# Patient Record
Sex: Male | Born: 1955 | Race: White | Hispanic: No | Marital: Married | Smoking: Current every day smoker
Health system: Southern US, Community
[De-identification: ages and names within clinical notes are randomized; demographics above are authoritative.]

## PROBLEM LIST (undated history)

## (undated) ENCOUNTER — Telehealth

## (undated) ENCOUNTER — Ambulatory Visit: Payer: PRIVATE HEALTH INSURANCE

## (undated) ENCOUNTER — Encounter

## (undated) ENCOUNTER — Ambulatory Visit

## (undated) ENCOUNTER — Ambulatory Visit
Attending: Thoracic Surgery (Cardiothoracic Vascular Surgery) | Primary: Thoracic Surgery (Cardiothoracic Vascular Surgery)

## (undated) ENCOUNTER — Ambulatory Visit: Payer: Medicare (Managed Care)

## (undated) ENCOUNTER — Institutional Professional Consult (permissible substitution): Payer: PRIVATE HEALTH INSURANCE

## (undated) ENCOUNTER — Telehealth: Attending: Family Medicine | Primary: Family Medicine

## (undated) DIAGNOSIS — G4700 Insomnia, unspecified: Secondary | ICD-10-CM

## (undated) DIAGNOSIS — Z87442 Personal history of urinary calculi: Secondary | ICD-10-CM

## (undated) DIAGNOSIS — I2510 Atherosclerotic heart disease of native coronary artery without angina pectoris: Secondary | ICD-10-CM

## (undated) DIAGNOSIS — I4510 Unspecified right bundle-branch block: Secondary | ICD-10-CM

## (undated) DIAGNOSIS — F1291 Cannabis use, unspecified, in remission: Secondary | ICD-10-CM

## (undated) DIAGNOSIS — F419 Anxiety disorder, unspecified: Secondary | ICD-10-CM

## (undated) DIAGNOSIS — N529 Male erectile dysfunction, unspecified: Secondary | ICD-10-CM

## (undated) DIAGNOSIS — G5600 Carpal tunnel syndrome, unspecified upper limb: Secondary | ICD-10-CM

## (undated) DIAGNOSIS — I739 Peripheral vascular disease, unspecified: Secondary | ICD-10-CM

## (undated) DIAGNOSIS — E221 Hyperprolactinemia: Secondary | ICD-10-CM

## (undated) DIAGNOSIS — M1990 Unspecified osteoarthritis, unspecified site: Secondary | ICD-10-CM

## (undated) DIAGNOSIS — E119 Type 2 diabetes mellitus without complications: Secondary | ICD-10-CM

## (undated) DIAGNOSIS — I10 Essential (primary) hypertension: Secondary | ICD-10-CM

## (undated) DIAGNOSIS — E785 Hyperlipidemia, unspecified: Secondary | ICD-10-CM

## (undated) DIAGNOSIS — E113593 Type 2 diabetes mellitus with proliferative diabetic retinopathy without macular edema, bilateral: Secondary | ICD-10-CM

## (undated) DIAGNOSIS — R002 Palpitations: Secondary | ICD-10-CM

## (undated) DIAGNOSIS — D369 Benign neoplasm, unspecified site: Secondary | ICD-10-CM

## (undated) DIAGNOSIS — F1190 Opioid use, unspecified, uncomplicated: Secondary | ICD-10-CM

## (undated) DIAGNOSIS — E291 Testicular hypofunction: Secondary | ICD-10-CM

## (undated) DIAGNOSIS — R0609 Other forms of dyspnea: Secondary | ICD-10-CM

## (undated) DIAGNOSIS — G4733 Obstructive sleep apnea (adult) (pediatric): Secondary | ICD-10-CM

## (undated) DIAGNOSIS — G4730 Sleep apnea, unspecified: Secondary | ICD-10-CM

## (undated) DIAGNOSIS — K219 Gastro-esophageal reflux disease without esophagitis: Secondary | ICD-10-CM

## (undated) DIAGNOSIS — G8929 Other chronic pain: Secondary | ICD-10-CM

## (undated) DIAGNOSIS — I4891 Unspecified atrial fibrillation: Secondary | ICD-10-CM

## (undated) DIAGNOSIS — K5790 Diverticulosis of intestine, part unspecified, without perforation or abscess without bleeding: Secondary | ICD-10-CM

## (undated) HISTORY — PX: EYE SURGERY: SHX253

## (undated) HISTORY — PX: KNEE ARTHROSCOPY: SUR90

## (undated) HISTORY — DX: Unspecified atrial fibrillation: I48.91

## (undated) HISTORY — DX: Type 2 diabetes mellitus without complications: E11.9

## (undated) HISTORY — DX: Sleep apnea, unspecified: G47.30

## (undated) HISTORY — DX: Atherosclerotic heart disease of native coronary artery without angina pectoris: I25.10

## (undated) HISTORY — PX: OTHER SURGICAL HISTORY: SHX169

## (undated) HISTORY — DX: Hyperlipidemia, unspecified: E78.5

## (undated) HISTORY — PX: TONSILLECTOMY: SUR1361

## (undated) HISTORY — PX: APPENDECTOMY: SHX54

## (undated) HISTORY — DX: Essential (primary) hypertension: I10

---

## 1993-05-07 DIAGNOSIS — I4891 Unspecified atrial fibrillation: Secondary | ICD-10-CM

## 1993-05-07 HISTORY — DX: Unspecified atrial fibrillation: I48.91

## 1997-11-10 ENCOUNTER — Ambulatory Visit (HOSPITAL_COMMUNITY): Admission: RE | Admit: 1997-11-10 | Discharge: 1997-11-10 | Payer: Self-pay | Admitting: Cardiovascular Disease

## 2006-02-07 ENCOUNTER — Ambulatory Visit: Payer: Self-pay | Admitting: Unknown Physician Specialty

## 2007-10-28 ENCOUNTER — Ambulatory Visit: Payer: Self-pay | Admitting: Medical

## 2009-11-19 ENCOUNTER — Inpatient Hospital Stay: Payer: Self-pay | Admitting: Surgery

## 2010-05-07 HISTORY — PX: CARDIAC CATHETERIZATION: SHX172

## 2011-01-15 ENCOUNTER — Ambulatory Visit: Payer: Self-pay | Admitting: Cardiovascular Disease

## 2011-01-15 DIAGNOSIS — I2510 Atherosclerotic heart disease of native coronary artery without angina pectoris: Secondary | ICD-10-CM

## 2011-01-15 HISTORY — DX: Atherosclerotic heart disease of native coronary artery without angina pectoris: I25.10

## 2011-01-15 HISTORY — PX: LEFT HEART CATH AND CORONARY ANGIOGRAPHY: CATH118249

## 2011-01-24 ENCOUNTER — Emergency Department: Payer: Self-pay | Admitting: Emergency Medicine

## 2011-03-15 ENCOUNTER — Ambulatory Visit: Payer: Self-pay | Admitting: Emergency Medicine

## 2011-03-16 LAB — PATHOLOGY REPORT

## 2012-08-18 ENCOUNTER — Emergency Department: Payer: Self-pay | Admitting: Emergency Medicine

## 2012-08-18 LAB — COMPREHENSIVE METABOLIC PANEL
Albumin: 3.9 g/dL (ref 3.4–5.0)
Alkaline Phosphatase: 115 U/L (ref 50–136)
Anion Gap: 8 (ref 7–16)
BUN: 16 mg/dL (ref 7–18)
Bilirubin,Total: 0.4 mg/dL (ref 0.2–1.0)
Calcium, Total: 9.2 mg/dL (ref 8.5–10.1)
Chloride: 105 mmol/L (ref 98–107)
Co2: 26 mmol/L (ref 21–32)
Creatinine: 0.93 mg/dL (ref 0.60–1.30)
EGFR (African American): >60
EGFR (Non-African Amer.): >60
Glucose: 159 mg/dL — ABNORMAL HIGH (ref 65–99)
Osmolality: 282 (ref 275–301)
Potassium: 3.7 mmol/L (ref 3.5–5.1)
SGOT(AST): 111 U/L — ABNORMAL HIGH (ref 15–37)
SGPT (ALT): 185 U/L — ABNORMAL HIGH (ref 12–78)
Sodium: 139 mmol/L (ref 136–145)
Total Protein: 7.9 g/dL (ref 6.4–8.2)

## 2012-08-18 LAB — TROPONIN I: Troponin-I: <0.02 ng/mL

## 2012-08-18 LAB — URINALYSIS, COMPLETE
Bacteria: NONE SEEN
Bilirubin,UR: NEGATIVE
Blood: NEGATIVE
Glucose,UR: 50 mg/dL (ref 0–75)
Ketone: NEGATIVE
Leukocyte Esterase: NEGATIVE
Nitrite: NEGATIVE
Ph: 5 (ref 4.5–8.0)
Protein: NEGATIVE
RBC,UR: <1 /HPF (ref 0–5)
Specific Gravity: 1.017 (ref 1.003–1.030)
Squamous Epithelial: NONE SEEN
WBC UR: <1 /HPF (ref 0–5)

## 2012-08-18 LAB — CK TOTAL AND CKMB (NOT AT ARMC)
CK, Total: 105 U/L (ref 35–232)
CK-MB: 1.5 ng/mL (ref 0.5–3.6)

## 2012-08-18 LAB — CBC
HCT: 44.5 % (ref 40.0–52.0)
HGB: 15.6 g/dL (ref 13.0–18.0)
MCH: 31 pg (ref 26.0–34.0)
MCHC: 34.9 g/dL (ref 32.0–36.0)
MCV: 89 fL (ref 80–100)
Platelet: 249 10*3/uL (ref 150–440)
RBC: 5.01 10*6/uL (ref 4.40–5.90)
RDW: 12.6 % (ref 11.5–14.5)
WBC: 11.3 10*3/uL — ABNORMAL HIGH (ref 3.8–10.6)

## 2012-08-26 ENCOUNTER — Ambulatory Visit: Payer: Self-pay | Admitting: Internal Medicine

## 2013-04-27 ENCOUNTER — Emergency Department: Payer: Self-pay | Admitting: Emergency Medicine

## 2013-05-07 HISTORY — PX: ROTATOR CUFF REPAIR: SHX139

## 2013-06-11 ENCOUNTER — Ambulatory Visit: Payer: Self-pay | Admitting: Orthopedic Surgery

## 2013-06-12 DIAGNOSIS — L570 Actinic keratosis: Secondary | ICD-10-CM

## 2013-06-12 HISTORY — DX: Actinic keratosis: L57.0

## 2013-07-28 ENCOUNTER — Ambulatory Visit: Payer: Self-pay | Admitting: Orthopedic Surgery

## 2013-09-11 DIAGNOSIS — Z9889 Other specified postprocedural states: Secondary | ICD-10-CM | POA: Insufficient documentation

## 2013-10-05 ENCOUNTER — Ambulatory Visit: Payer: Self-pay | Admitting: Unknown Physician Specialty

## 2013-10-28 DIAGNOSIS — M25569 Pain in unspecified knee: Secondary | ICD-10-CM

## 2013-10-28 DIAGNOSIS — G8929 Other chronic pain: Secondary | ICD-10-CM | POA: Insufficient documentation

## 2014-02-14 DIAGNOSIS — Z794 Long term (current) use of insulin: Secondary | ICD-10-CM | POA: Insufficient documentation

## 2014-02-16 DIAGNOSIS — N529 Male erectile dysfunction, unspecified: Secondary | ICD-10-CM | POA: Insufficient documentation

## 2014-02-16 DIAGNOSIS — E662 Morbid (severe) obesity with alveolar hypoventilation: Secondary | ICD-10-CM | POA: Insufficient documentation

## 2014-08-28 NOTE — Op Note (Signed)
PATIENT NAME:  Joshua Fowler, Joshua Fowler MR#:  [id number] DATE OF BIRTH:  21-May-1955  DATE OF PROCEDURE:  07/28/2013  PREOPERATIVE DIAGNOSIS:  Right shoulder full-thickness rotator cuff tear, supraspinatus and anterior aspect of infraspinatus.    POSTOPERATIVE DIAGNOSIS: Right shoulder full-thickness rotator cuff tear, supraspinatus and anterior aspect of infraspinatus.  PROCEDURE PERFORMED:  1.  Arthroscopic repair of rotator cuff.  2.  Subacromial decompression.  3.  Biceps tenotomy.  4.  Extensive bursectomy.   SURGEON:  Dawayne Patricia, MD   ASSISTANT:April Tretha Sciara, NP ANESTHESIA:  General anesthesia with interscalene block.   SURGICAL FINDINGS:   1. Full-thickness tear of supraspinatus with anterior portion of infraspinatus.  2. Significant bursitis with downward sloping acromial spur.  3. Partial tear subscapularis. 4. Ruptured biceps tendon.    COMPLICATIONS:  None.   DISPOSITION:  The patient will be discharged home same day surgery and will follow up in the office on Friday.   INDICATIONS FOR PROCEDURE:  Joshua Fowler is a 59 year old gentleman who sustained a tear, rotator cuff, right shoulder. After confirmation with MRI, extensive discussion is had with patient regarding surgical intervention. All the risks and benefits were explained. The patient decided to proceed with surgical intervention.   DESCRIPTION OF PROCEDURE:  The patient was identified in the preoperative holding area. The right shoulder was marked as operative site.   The patient was brought into the operating room. Interscalene block was administered. General anesthesia was administered. The patient was placed on the operating room table and secured in a beach chair position. Adequate padding was placed at bony prominences. The patient was secured to the table.   Timeout was performed. The patient was identified, laterality, procedure, confirmation of consent form, confirmation of preoperative antibiotics, skin  preparation, and site marking.   A standard posterior portal was made. Arthroscope was inserted into the glenohumeral joint. The patient was found to have significant fraying of the proximal aspect of the splenoid labrum. Biceps tendon was completely ruptured and no longer visible. On inspection of the bicipital groove, biceps tendon appeared to be tenodesed by soft tissue scar. West Carbo was used to remove excessive synovitis and debride a significant scar along the anterior capsule. Glenoid demonstrated some grade 1-2 degenerative changes. Humeral head demonstrated some grade 1 degenerative changes.   Attention was now turned to the rotator cuff. The patient had a very large rotator cuff tear that was full thickness with retraction to the glenohumeral articulation. Subscapularis demonstrated a small partial thickness tear with the remainder of the tendon intact. A lateral portal was made. The camera was now moved into the subacromial space. West Carbo was inserted through the lateral portal into the subacromial space. A very extensive bursectomy was carried out. Using a shaver and burr, a subacromial decompression was carried out leaving a level surface and clearing sufficient space for rotator cuff repair. At this time, the torn edge of the rotator cuff was identified and cleaned. The humeral footprint was cleaned of soft tissue, and gently burred to achieve a nicely bleeding surface. In the medial area of the footprint, an anterior SwiveLock anchor was placed. A looped FiberTape was passed through the anterior portion of the rotator cuff. In a similar fashion, a posterior anchor was placed in the posterior aspect of the medial footprint. Linked FiberTape suture was passed in the posterior one-half. Both sutures were retrieved at the anterior portal. A stay stitch in both the anterior and posterior anchors were passed through the cuff in between the fiber tapes.  These were passed in a horizontal fashion. Both of  these sutures were tied using sliding knots. Rotator cuff nicely reapproximated to footprint. At this time, both suture splices were cut, and 1 limb of each suture was taken back through the lateral portal. The 2 suture limbs were passed through a SwiveLock anchor and loaded. An awl was used to make a hole in the posterior aspect of the lateral footprint. The SwiveLock anchor with 1 suture from each limb of the medial row was deployed. Each suture limb was tightened, nicely reducing the rotator cuff back to the humeral footprint. Anchor was fully secured. Suture limbs were cut. In a similar fashion, the remaining 2 suture limbs from the medial row were passed through a second SwiveLock anchor. A second hole was made in the anterior aspect of the lateral row, and SwiveLock anchor was deployed. Sutures were tightened, and cuff was nicely reduced. Suture limbs were cut.   The shoulder was copiously irrigated. At this time, the shoulder was taken through a full range of motion, and rotator cuff was found to be nicely secured throughout the pass of repair. Arthroscope was now reinserted into the glenohumeral joint, and the rotator cuff was very nicely laid down on the humeral footprint.   At this time, the shoulder was drained of excess fluid. All instrumentation was removed. Portals were closed using 2-0 Vicryl suture for subcutaneous tissue and 3-0 nylon suture for skin. Sterile dressings were applied. The patient was placed in a sling with an abduction pillow. TENS leads were applied. A Polar Care unit was applied. Care was taken to ensure that the Polar Care did not touch exposed skin at any portion. The patient was awoken and taken to the postoperative care unit in stable condition.     ____________________________ Dawayne Patricia, MD sr:lt D: 08/03/2013 16:43:09 ET T: 08/04/2013 00:21:41 ET JOB#: [id number]  cc: Dawayne Patricia, MD, <Dictator> Dawayne Patricia MD ELECTRONICALLY SIGNED 08/25/2013  11:52

## 2014-11-24 ENCOUNTER — Other Ambulatory Visit: Payer: Self-pay | Admitting: Family Medicine

## 2014-11-24 MED ORDER — INSULIN LISPRO 100 UNIT/ML (KWIKPEN): 40.0000 [IU] | PEN_INJECTOR | Freq: Three times a day (TID) | SUBCUTANEOUS | Status: DC

## 2014-11-24 MED ORDER — INSULIN GLARGINE 100 UNIT/ML SOLOSTAR PEN
280.0000 [IU] | PEN_INJECTOR | Freq: Every day | SUBCUTANEOUS | Status: DC
Start: 2014-11-24 — End: 2015-10-06

## 2015-01-26 ENCOUNTER — Other Ambulatory Visit: Payer: Self-pay | Admitting: Family Medicine

## 2015-02-15 ENCOUNTER — Other Ambulatory Visit: Payer: Self-pay | Admitting: Family Medicine

## 2015-04-05 ENCOUNTER — Other Ambulatory Visit: Payer: Self-pay | Admitting: Family Medicine

## 2015-04-05 DIAGNOSIS — I10 Essential (primary) hypertension: Secondary | ICD-10-CM | POA: Insufficient documentation

## 2015-04-05 DIAGNOSIS — E119 Type 2 diabetes mellitus without complications: Secondary | ICD-10-CM | POA: Insufficient documentation

## 2015-04-05 DIAGNOSIS — E785 Hyperlipidemia, unspecified: Secondary | ICD-10-CM | POA: Insufficient documentation

## 2015-04-06 ENCOUNTER — Ambulatory Visit (INDEPENDENT_AMBULATORY_CARE_PROVIDER_SITE_OTHER): Payer: 59 | Admitting: Family Medicine

## 2015-04-06 ENCOUNTER — Encounter: Payer: Self-pay | Admitting: Family Medicine

## 2015-04-06 VITALS — BP 115/71 | HR 75 | Temp 99.3°F | Ht 67.5 in | Wt 254.0 lb

## 2015-04-06 DIAGNOSIS — G4730 Sleep apnea, unspecified: Secondary | ICD-10-CM | POA: Diagnosis not present

## 2015-04-06 DIAGNOSIS — I2510 Atherosclerotic heart disease of native coronary artery without angina pectoris: Secondary | ICD-10-CM | POA: Insufficient documentation

## 2015-04-06 DIAGNOSIS — E785 Hyperlipidemia, unspecified: Secondary | ICD-10-CM

## 2015-04-06 DIAGNOSIS — Z23 Encounter for immunization: Secondary | ICD-10-CM

## 2015-04-06 DIAGNOSIS — I10 Essential (primary) hypertension: Secondary | ICD-10-CM

## 2015-04-06 DIAGNOSIS — Z113 Encounter for screening for infections with a predominantly sexual mode of transmission: Secondary | ICD-10-CM | POA: Diagnosis not present

## 2015-04-06 DIAGNOSIS — E119 Type 2 diabetes mellitus without complications: Secondary | ICD-10-CM | POA: Diagnosis not present

## 2015-04-06 DIAGNOSIS — Z0000 Encounter for general adult medical examination without abnormal findings: Secondary | ICD-10-CM

## 2015-04-06 LAB — URINALYSIS, ROUTINE W REFLEX MICROSCOPIC
Bilirubin, UA: NEGATIVE
Leukocytes, UA: NEGATIVE
Nitrite, UA: NEGATIVE
Protein, UA: NEGATIVE
RBC, UA: NEGATIVE
Specific Gravity, UA: 1.02 (ref 1.005–1.030)
Urobilinogen, Ur: 0.2 mg/dL (ref 0.2–1.0)
pH, UA: 5 (ref 5.0–7.5)

## 2015-04-06 MED ORDER — HYDRALAZINE HCL 25 MG PO TABS: 25.0000 mg | ORAL_TABLET | Freq: Two times a day (BID) | ORAL | Status: DC

## 2015-04-06 MED ORDER — PANTOPRAZOLE SODIUM 40 MG PO TBEC: 40.0000 mg | DELAYED_RELEASE_TABLET | Freq: Every day | ORAL | Status: DC

## 2015-04-06 MED ORDER — EZETIMIBE 10 MG PO TABS: 10.0000 mg | ORAL_TABLET | Freq: Every day | ORAL | Status: DC

## 2015-04-06 MED ORDER — COLESEVELAM HCL 625 MG PO TABS: 1875.0000 mg | ORAL_TABLET | Freq: Two times a day (BID) | ORAL | Status: DC

## 2015-04-06 MED ORDER — VENLAFAXINE HCL ER 37.5 MG PO CP24: 37.5000 mg | ORAL_CAPSULE | Freq: Every day | ORAL | Status: DC

## 2015-04-06 NOTE — Assessment & Plan Note (Signed)
The current medical regimen is effective;  continue present plan and medications.  

## 2015-04-06 NOTE — Assessment & Plan Note (Signed)
Followed by endocrine

## 2015-04-06 NOTE — Progress Notes (Signed)
BP 115/71 mmHg  Pulse 75  Temp(Src) 99.3 F (37.4 C)  Ht 5' 7.5" (1.715 m)  Wt 254 lb (115.214 kg)  BMI 39.17 kg/m2  SpO2 98%   Subjective:    Patient ID: Joshua Fowler, male    DOB: Sep 22, 1955, 59 y.o.   MRN: [id number]  HPI: Joshua Fowler is a 59 y.o. male  Chief Complaint  Patient presents with  . Annual Exam    following with endocrinology for diabetes A1c in the sevens has lost 18 pounds with phentermine prescribed by Dr. Eddie Dibbles  Patient was some congestion and drainage more allergy symptoms used Claritin for a little bit didn't do much has used some Afrin. Rarely uses Ambien Using CPAP every night Reflux stable Cholesterol doing well Depression nerves doing okay  Relevant past medical, surgical, family and social history reviewed and updated as indicated. Interim medical history since our last visit reviewed. Allergies and medications reviewed and updated.  Review of Systems  Constitutional: Negative.   HENT: Negative.   Eyes: Negative.   Respiratory: Negative.   Cardiovascular: Negative.   Gastrointestinal: Negative.   Endocrine: Negative.   Genitourinary: Negative.   Musculoskeletal: Negative.   Skin: Negative.   Allergic/Immunologic: Negative.   Neurological: Negative.   Hematological: Negative.   Psychiatric/Behavioral: Negative.     Per HPI unless specifically indicated above     Objective:    BP 115/71 mmHg  Pulse 75  Temp(Src) 99.3 F (37.4 C)  Ht 5' 7.5" (1.715 m)  Wt 254 lb (115.214 kg)  BMI 39.17 kg/m2  SpO2 98%  Wt Readings from Last 3 Encounters:  04/06/15 254 lb (115.214 kg)  08/10/14 272 lb (123.378 kg)    Physical Exam  Constitutional: He is oriented to person, place, and time. He appears well-developed and well-nourished.  HENT:  Head: Normocephalic and atraumatic.  Right Ear: External ear normal.  Left Ear: External ear normal.  Eyes: Conjunctivae and EOM are normal. Pupils are equal, round, and reactive to light.   Neck: Normal range of motion. Neck supple.  Cardiovascular: Normal rate, regular rhythm, normal heart sounds and intact distal pulses.   Pulmonary/Chest: Effort normal and breath sounds normal.  Abdominal: Soft. Bowel sounds are normal. There is no splenomegaly or hepatomegaly.  Genitourinary: Rectum normal, prostate normal and penis normal.  Musculoskeletal: Normal range of motion.  Neurological: He is alert and oriented to person, place, and time. He has normal reflexes.  Skin: No rash noted. No erythema.  Psychiatric: He has a normal mood and affect. His behavior is normal. Judgment and thought content normal.    Results for orders placed or performed in visit on 08/18/12  CK total and CKMB (cardiac)  Result Value Ref Range   CK, Total 105 35-232 Unit/L   CK-MB 1.5 0.5-3.6 ng/mL  Comprehensive metabolic panel  Result Value Ref Range   Glucose 159 (H) 65-99 mg/dL   BUN 16 7-18 mg/dL   Creatinine 0.93 0.60-1.30 mg/dL   Sodium 139 136-145 mmol/L   Potassium 3.7 3.5-5.1 mmol/L   Chloride 105 98-107 mmol/L   Co2 26 21-32 mmol/L   Calcium, Total 9.2 8.5-10.1 mg/dL   SGOT(AST) 111 (H) 15-37 Unit/L   SGPT (ALT) 185 (H) 12-78 U/L   Alkaline Phosphatase 115 50-136 Unit/L   Albumin 3.9 3.4-5.0 g/dL   Total Protein 7.9 6.4-8.2 g/dL   Bilirubin,Total 0.4 0.2-1.0 mg/dL   Osmolality 282 275-301   Anion Gap 8 7-16   EGFR (African American) >60  EGFR (Non-African Amer.) >60   CBC  Result Value Ref Range   WBC 11.3 (H) 3.8-10.6 x10 3/mm 3   RBC 5.01 4.40-5.90 x10 6/mm 3   HGB 15.6 13.0-18.0 g/dL   HCT 44.5 40.0-52.0 %   MCV 89 80-100 fL   MCH 31.0 26.0-34.0 pg   MCHC 34.9 32.0-36.0 g/dL   RDW 12.6 11.5-14.5 %   Platelet 249 150-440 x10 3/mm 3  Troponin I  Result Value Ref Range   Troponin-I < 0.02 ng/mL  Urinalysis, Complete  Result Value Ref Range   Color - urine Yellow    Clarity - urine Clear    Glucose,UR 50 mg/dL 0-75 mg/dL   Bilirubin,UR Negative NEGATIVE   Ketone  Negative NEGATIVE   Specific Gravity 1.017 1.003-1.030   Blood Negative NEGATIVE   Ph 5.0 4.5-8.0   Protein Negative NEGATIVE   Nitrite Negative NEGATIVE   Leukocyte Esterase Negative NEGATIVE   RBC,UR <1 /HPF 0-5 /HPF   WBC UR <1 /HPF 0-5 /HPF   Bacteria NONE SEEN NONE SEEN   Squamous Epithelial NONE SEEN       Assessment & Plan:   Problem List Items Addressed This Visit      Cardiovascular and Mediastinum   Hypertension    The current medical regimen is effective;  continue present plan and medications.       Relevant Medications   Tadalafil 2.5 MG TABS   colesevelam (WELCHOL) 625 MG tablet   ezetimibe (ZETIA) 10 MG tablet   hydrALAZINE (APRESOLINE) 25 MG tablet   CAD (coronary artery disease)   Relevant Medications   Tadalafil 2.5 MG TABS   colesevelam (WELCHOL) 625 MG tablet   ezetimibe (ZETIA) 10 MG tablet   hydrALAZINE (APRESOLINE) 25 MG tablet     Endocrine   Diabetes mellitus without complication (HCC)    Followed by endocrine      Relevant Medications   Insulin Glargine (TOUJEO SOLOSTAR) 300 UNIT/ML SOPN     Other   Sleep apnea   Hyperlipidemia    The current medical regimen is effective;  continue present plan and medications.       Relevant Medications   Tadalafil 2.5 MG TABS   colesevelam (WELCHOL) 625 MG tablet   ezetimibe (ZETIA) 10 MG tablet   hydrALAZINE (APRESOLINE) 25 MG tablet    Other Visit Diagnoses    Routine general medical examination at a health care facility    -  Primary    Relevant Orders    CBC with Differential/Platelet    Comprehensive metabolic panel    Lipid Panel w/o Chol/HDL Ratio    PSA    TSH    Urinalysis, Routine w reflex microscopic (not at [facility])    Immunization due        Relevant Orders    Tdap vaccine greater than or equal to 7yo IM (Completed)    Flu Vaccine QUAD 36+ mos PF IM (Fluarix & Fluzone Quad PF) (Completed)    Routine screening for STI (sexually transmitted infection)        Relevant Orders     HIV antibody    Hepatitis C Antibody        Follow up plan: Return in about 6 months (around 10/04/2015), or if symptoms worsen or fail to improve, for lipids, alt, ast, bmp med check.

## 2015-04-07 LAB — CBC WITH DIFFERENTIAL/PLATELET
Basophils Absolute: 0.1 10*3/uL (ref 0.0–0.2)
Basos: 1 %
EOS (ABSOLUTE): 1 10*3/uL — ABNORMAL HIGH (ref 0.0–0.4)
Eos: 14 %
Hematocrit: 44 % (ref 37.5–51.0)
Hemoglobin: 14.6 g/dL (ref 12.6–17.7)
Immature Grans (Abs): 0 10*3/uL (ref 0.0–0.1)
Immature Granulocytes: 0 %
Lymphocytes Absolute: 2.1 10*3/uL (ref 0.7–3.1)
Lymphs: 30 %
MCH: 30.5 pg (ref 26.6–33.0)
MCHC: 33.2 g/dL (ref 31.5–35.7)
MCV: 92 fL (ref 79–97)
Monocytes Absolute: 0.4 10*3/uL (ref 0.1–0.9)
Monocytes: 6 %
Neutrophils Absolute: 3.3 10*3/uL (ref 1.4–7.0)
Neutrophils: 49 %
Platelets: 243 10*3/uL (ref 150–379)
RBC: 4.79 x10E6/uL (ref 4.14–5.80)
RDW: 13.2 % (ref 12.3–15.4)
WBC: 6.9 10*3/uL (ref 3.4–10.8)

## 2015-04-07 LAB — COMPREHENSIVE METABOLIC PANEL
ALT: 25 IU/L (ref 0–44)
AST: 24 IU/L (ref 0–40)
Albumin/Globulin Ratio: 1.6 (ref 1.1–2.5)
Albumin: 4.4 g/dL (ref 3.6–4.8)
Alkaline Phosphatase: 86 IU/L (ref 39–117)
BUN/Creatinine Ratio: 24 — ABNORMAL HIGH (ref 10–22)
BUN: 26 mg/dL (ref 8–27)
Bilirubin Total: 0.4 mg/dL (ref 0.0–1.2)
CO2: 26 mmol/L (ref 18–29)
Calcium: 9.6 mg/dL (ref 8.6–10.2)
Chloride: 97 mmol/L (ref 97–106)
Creatinine, Ser: 1.1 mg/dL (ref 0.76–1.27)
GFR calc Af Amer: 84 mL/min/{1.73_m2} (ref >59)
GFR calc non Af Amer: 73 mL/min/{1.73_m2} (ref >59)
Globulin, Total: 2.7 g/dL (ref 1.5–4.5)
Glucose: 131 mg/dL — ABNORMAL HIGH (ref 65–99)
Potassium: 4.9 mmol/L (ref 3.5–5.2)
Sodium: 139 mmol/L (ref 136–144)
Total Protein: 7.1 g/dL (ref 6.0–8.5)

## 2015-04-07 LAB — HIV ANTIBODY (ROUTINE TESTING W REFLEX): HIV Screen 4th Generation wRfx: NONREACTIVE

## 2015-04-07 LAB — LIPID PANEL W/O CHOL/HDL RATIO
Cholesterol, Total: 147 mg/dL (ref 100–199)
HDL: 29 mg/dL — ABNORMAL LOW (ref >39)
LDL Calculated: 80 mg/dL (ref 0–99)
Triglycerides: 190 mg/dL — ABNORMAL HIGH (ref 0–149)
VLDL Cholesterol Cal: 38 mg/dL (ref 5–40)

## 2015-04-07 LAB — PSA: Prostate Specific Ag, Serum: 0.3 ng/mL (ref 0.0–4.0)

## 2015-04-07 LAB — TSH: TSH: 1.36 u[IU]/mL (ref 0.450–4.500)

## 2015-04-07 LAB — HEPATITIS C ANTIBODY: Hep C Virus Ab: <0.1 s/co ratio (ref 0.0–0.9)

## 2015-04-11 ENCOUNTER — Encounter: Payer: Self-pay | Admitting: Family Medicine

## 2015-04-19 ENCOUNTER — Other Ambulatory Visit: Payer: Self-pay | Admitting: Family Medicine

## 2015-04-19 ENCOUNTER — Telehealth: Payer: Self-pay | Admitting: Family Medicine

## 2015-04-19 MED ORDER — VARENICLINE TARTRATE 0.5 MG X 11 & 1 MG X 42 PO MISC: ORAL | Status: DC

## 2015-04-19 MED ORDER — VARENICLINE TARTRATE 1 MG PO TABS: 1.0000 mg | ORAL_TABLET | Freq: Two times a day (BID) | ORAL | Status: DC

## 2015-04-19 NOTE — Telephone Encounter (Signed)
Pt would like a rx for chantix sent to [facility]

## 2015-04-26 ENCOUNTER — Telehealth: Payer: Self-pay | Admitting: Family Medicine

## 2015-04-26 MED ORDER — AZITHROMYCIN 250 MG PO TABS: ORAL_TABLET | ORAL | Status: DC

## 2015-04-26 NOTE — Telephone Encounter (Signed)
Pt would like to have zpac sent to [facility]

## 2015-07-01 ENCOUNTER — Other Ambulatory Visit: Payer: Self-pay | Admitting: Family Medicine

## 2015-07-27 LAB — HM DIABETES EYE EXAM

## 2015-08-30 ENCOUNTER — Other Ambulatory Visit: Payer: Self-pay | Admitting: Family Medicine

## 2015-08-30 DIAGNOSIS — R252 Cramp and spasm: Secondary | ICD-10-CM

## 2015-08-31 ENCOUNTER — Other Ambulatory Visit: Payer: 59

## 2015-08-31 DIAGNOSIS — R252 Cramp and spasm: Secondary | ICD-10-CM

## 2015-09-01 ENCOUNTER — Telehealth: Payer: Self-pay | Admitting: Family Medicine

## 2015-09-01 LAB — BASIC METABOLIC PANEL
BUN/Creatinine Ratio: 27 — ABNORMAL HIGH (ref 10–24)
BUN: 33 mg/dL — ABNORMAL HIGH (ref 8–27)
CO2: 23 mmol/L (ref 18–29)
Calcium: 10 mg/dL (ref 8.6–10.2)
Chloride: 96 mmol/L (ref 96–106)
Creatinine, Ser: 1.22 mg/dL (ref 0.76–1.27)
GFR calc Af Amer: 74 mL/min/{1.73_m2} (ref >59)
GFR calc non Af Amer: 64 mL/min/{1.73_m2} (ref >59)
Glucose: 243 mg/dL — ABNORMAL HIGH (ref 65–99)
Potassium: 5.1 mmol/L (ref 3.5–5.2)
Sodium: 137 mmol/L (ref 134–144)

## 2015-09-01 LAB — MAGNESIUM: Magnesium: 2 mg/dL (ref 1.6–2.3)

## 2015-09-01 NOTE — Telephone Encounter (Signed)
Phone call Discussed with patient no obvious cause for cramping on blood work other than elevated blood sugar reviewed better control of diabetes care for cramping and follow-up as problems.

## 2015-10-05 ENCOUNTER — Other Ambulatory Visit: Payer: Self-pay

## 2015-10-06 ENCOUNTER — Encounter: Payer: Self-pay | Admitting: Family Medicine

## 2015-10-06 ENCOUNTER — Ambulatory Visit (INDEPENDENT_AMBULATORY_CARE_PROVIDER_SITE_OTHER): Payer: 59 | Admitting: Family Medicine

## 2015-10-06 VITALS — BP 118/69 | HR 76 | Temp 98.3°F | Ht 67.5 in | Wt 250.0 lb

## 2015-10-06 DIAGNOSIS — E785 Hyperlipidemia, unspecified: Secondary | ICD-10-CM

## 2015-10-06 DIAGNOSIS — E119 Type 2 diabetes mellitus without complications: Secondary | ICD-10-CM

## 2015-10-06 DIAGNOSIS — I10 Essential (primary) hypertension: Secondary | ICD-10-CM

## 2015-10-06 MED ORDER — AZITHROMYCIN 250 MG PO TABS
ORAL_TABLET | ORAL | Status: DC
Start: 2015-10-06 — End: 2016-04-11

## 2015-10-06 NOTE — Assessment & Plan Note (Signed)
The current medical regimen is effective;  continue present plan and medications.  

## 2015-10-06 NOTE — Progress Notes (Signed)
BP 118/69 mmHg  Pulse 76  Temp(Src) 98.3 F (36.8 C)  Ht 5' 7.5" (1.715 m)  Wt 250 lb (113.399 kg)  BMI 38.56 kg/m2  SpO2 98%   Subjective:    Patient ID: Joshua Fowler, male    DOB: [birth year], 60 y.o.   MRN: [id number]  HPI: Joshua Fowler is a 60 y.o. male  Chief Complaint  Patient presents with  . Hypertension  . Hyperlipidemia  . URI    x 2-3 days   Patient with sinus congestion and facial pain and drainage some slight fever traveled on an airplane back from [place] last week and now got this cold. Blood pressure cholesterol doing well Last A1c was the best it's been in years with patient's weight loss using Saxenda Followed by endocrinology for diabetes Relevant past medical, surgical, family and social history reviewed and updated as indicated. Interim medical history since our last visit reviewed. Allergies and medications reviewed and updated.  Review of Systems  Constitutional: Negative.   Respiratory: Negative.   Cardiovascular: Negative.     Per HPI unless specifically indicated above     Objective:    BP 118/69 mmHg  Pulse 76  Temp(Src) 98.3 F (36.8 C)  Ht 5' 7.5" (1.715 m)  Wt 250 lb (113.399 kg)  BMI 38.56 kg/m2  SpO2 98%  Wt Readings from Last 3 Encounters:  10/06/15 250 lb (113.399 kg)  04/06/15 254 lb (115.214 kg)  08/10/14 272 lb (123.378 kg)    Physical Exam  Constitutional: He is oriented to person, place, and time. He appears well-developed and well-nourished. No distress.  HENT:  Head: Normocephalic and atraumatic.  Right Ear: Hearing normal.  Left Ear: Hearing normal.  Nose: Nose normal.  Eyes: Conjunctivae and lids are normal. Right eye exhibits no discharge. Left eye exhibits no discharge. No scleral icterus.  Cardiovascular: Normal rate, regular rhythm and normal heart sounds.   Pulmonary/Chest: Effort normal and breath sounds normal. No respiratory distress.  Musculoskeletal: Normal range of motion.  Neurological: He  is alert and oriented to person, place, and time.  Skin: Skin is intact. No rash noted.  Psychiatric: He has a normal mood and affect. His speech is normal and behavior is normal. Judgment and thought content normal. Cognition and memory are normal.    Results for orders placed or performed in visit on [id number]  Basic metabolic panel  Result Value Ref Range   Glucose 243 (H) 65 - 99 mg/dL   BUN 33 (H) 8 - 27 mg/dL   Creatinine, Ser 1.22 0.76 - 1.27 mg/dL   GFR calc non Af Amer 64 >59 mL/min/1.73   GFR calc Af Amer 74 >59 mL/min/1.73   BUN/Creatinine Ratio 27 (H) 10 - 24   Sodium 137 134 - 144 mmol/L   Potassium 5.1 3.5 - 5.2 mmol/L   Chloride 96 96 - 106 mmol/L   CO2 23 18 - 29 mmol/L   Calcium 10.0 8.6 - 10.2 mg/dL  Magnesium  Result Value Ref Range   Magnesium 2.0 1.6 - 2.3 mg/dL      Assessment & Plan:   Problem List Items Addressed This Visit      Cardiovascular and Mediastinum   Hypertension - Primary    The current medical regimen is effective;  continue present plan and medications.       Relevant Medications   Olmesartan-Amlodipine-HCTZ (TRIBENZOR) 40-10-25 MG TABS   Other Relevant Orders   Basic metabolic panel   LP+ALT+AST Piccolo,  Waived     Endocrine   Diabetes mellitus without complication ([place])    Followed by endocrinology       Relevant Medications   Canagliflozin-Metformin HCl (INVOKAMET PO)   Olmesartan-Amlodipine-HCTZ (TRIBENZOR) 40-10-25 MG TABS     Other   Hyperlipidemia    The current medical regimen is effective;  continue present plan and medications.       Relevant Medications   Olmesartan-Amlodipine-HCTZ (TRIBENZOR) 40-10-25 MG TABS   Other Relevant Orders   Basic metabolic panel   LP+ALT+AST Piccolo, Waived    Sinusitis Will give azithromycin medication education on use of medications and 50 cc  Follow up plan: Return in about 6 months (around 04/06/2016) for Physical Exam.

## 2015-10-06 NOTE — Assessment & Plan Note (Signed)
Followed by endocrinology 

## 2015-10-07 ENCOUNTER — Encounter: Payer: Self-pay | Admitting: Family Medicine

## 2015-10-07 LAB — BASIC METABOLIC PANEL
BUN/Creatinine Ratio: 27 — ABNORMAL HIGH (ref 10–24)
BUN: 34 mg/dL — ABNORMAL HIGH (ref 8–27)
CO2: 21 mmol/L (ref 18–29)
Calcium: 9.6 mg/dL (ref 8.6–10.2)
Chloride: 99 mmol/L (ref 96–106)
Creatinine, Ser: 1.26 mg/dL (ref 0.76–1.27)
GFR calc Af Amer: 71 mL/min/{1.73_m2} (ref >59)
GFR calc non Af Amer: 61 mL/min/{1.73_m2} (ref >59)
Glucose: 183 mg/dL — ABNORMAL HIGH (ref 65–99)
Potassium: 5.1 mmol/L (ref 3.5–5.2)
Sodium: 139 mmol/L (ref 134–144)

## 2015-10-13 ENCOUNTER — Telehealth: Payer: Self-pay | Admitting: Family Medicine

## 2015-10-13 NOTE — Telephone Encounter (Signed)
Pt called stated the Z pac Dr. Jeananne Rama gave him is not clearing up the problem he is having, he keeps coughing up green stuff. Wants to know if something else can be sent to the pharmacy. Pharm is [facility] in [place]. Thanks.

## 2015-10-13 NOTE — Telephone Encounter (Signed)
That medicine continues to work in his system for 10 days. If he's not better by Monday, let us know.

## 2015-10-14 MED ORDER — BENZONATATE 200 MG PO CAPS: 200.0000 mg | ORAL_CAPSULE | Freq: Three times a day (TID) | ORAL | Status: DC | PRN

## 2015-10-14 NOTE — Telephone Encounter (Signed)
Rx for tessalon sent to his pharmacy. If not better by Monday, will send in abx.

## 2015-10-14 NOTE — Telephone Encounter (Signed)
Patient upset and is demanding another zpak or he will call Dr. Jeananne Rama on his cell.  Dr. Wynetta Emery still feels he needs to wait at least until Monday to let ABX work but would be willing to write an Rx for cough suppressant or Tessalon Pearls

## 2015-11-16 LAB — LP+ALT+AST PICCOLO, WAIVED
ALT (SGPT) Piccolo, Waived: 41 U/L (ref 10–47)
AST (SGOT) Piccolo, Waived: 52 U/L — ABNORMAL HIGH (ref 11–38)
Chol/HDL Ratio Piccolo,Waive: 5.2 mg/dL — ABNORMAL HIGH
Cholesterol Piccolo, Waived: 137 mg/dL (ref <200)
HDL Chol Piccolo, Waived: 26 mg/dL — ABNORMAL LOW (ref >59)
LDL Chol Calc Piccolo Waived: 61 mg/dL (ref <100)
Triglycerides Piccolo,Waived: 248 mg/dL — ABNORMAL HIGH (ref <150)
VLDL Chol Calc Piccolo,Waive: 50 mg/dL — ABNORMAL HIGH (ref <30)

## 2015-12-06 ENCOUNTER — Other Ambulatory Visit: Payer: Self-pay | Admitting: Family Medicine

## 2015-12-29 LAB — HM DIABETES EYE EXAM

## 2016-01-07 ENCOUNTER — Other Ambulatory Visit: Payer: Self-pay | Admitting: Family Medicine

## 2016-01-31 ENCOUNTER — Other Ambulatory Visit: Payer: Self-pay | Admitting: Family Medicine

## 2016-03-19 ENCOUNTER — Other Ambulatory Visit: Payer: Self-pay | Admitting: Family Medicine

## 2016-03-19 NOTE — Telephone Encounter (Signed)
Routing to provider, appt on 04/11/16.

## 2016-03-23 LAB — HM DIABETES EYE EXAM

## 2016-04-02 LAB — HM DIABETES EYE EXAM

## 2016-04-05 ENCOUNTER — Ambulatory Visit (INDEPENDENT_AMBULATORY_CARE_PROVIDER_SITE_OTHER): Payer: 59

## 2016-04-05 DIAGNOSIS — Z23 Encounter for immunization: Secondary | ICD-10-CM | POA: Diagnosis not present

## 2016-04-06 LAB — BASIC METABOLIC PANEL: Glucose: 139 mg/dL

## 2016-04-06 LAB — TSH: TSH: 1.63 u[IU]/mL (ref <=5.90)

## 2016-04-06 LAB — HEMOGLOBIN A1C: Hemoglobin A1C: 6.9

## 2016-04-08 ENCOUNTER — Other Ambulatory Visit: Payer: Self-pay | Admitting: Family Medicine

## 2016-04-08 DIAGNOSIS — I10 Essential (primary) hypertension: Secondary | ICD-10-CM

## 2016-04-08 DIAGNOSIS — E785 Hyperlipidemia, unspecified: Secondary | ICD-10-CM

## 2016-04-09 NOTE — Telephone Encounter (Signed)
Routing to provider, appt on 04/11/16

## 2016-04-11 ENCOUNTER — Encounter: Payer: Self-pay | Admitting: Family Medicine

## 2016-04-11 ENCOUNTER — Ambulatory Visit (INDEPENDENT_AMBULATORY_CARE_PROVIDER_SITE_OTHER): Payer: PRIVATE HEALTH INSURANCE | Admitting: Family Medicine

## 2016-04-11 VITALS — BP 103/65 | HR 72 | Temp 98.4°F | Ht 67.5 in | Wt 249.4 lb

## 2016-04-11 DIAGNOSIS — Z0000 Encounter for general adult medical examination without abnormal findings: Secondary | ICD-10-CM

## 2016-04-11 DIAGNOSIS — E119 Type 2 diabetes mellitus without complications: Secondary | ICD-10-CM | POA: Diagnosis not present

## 2016-04-11 DIAGNOSIS — I2510 Atherosclerotic heart disease of native coronary artery without angina pectoris: Secondary | ICD-10-CM | POA: Diagnosis not present

## 2016-04-11 DIAGNOSIS — I10 Essential (primary) hypertension: Secondary | ICD-10-CM

## 2016-04-11 DIAGNOSIS — E7800 Pure hypercholesterolemia, unspecified: Secondary | ICD-10-CM

## 2016-04-11 DIAGNOSIS — G4730 Sleep apnea, unspecified: Secondary | ICD-10-CM

## 2016-04-11 DIAGNOSIS — E291 Testicular hypofunction: Secondary | ICD-10-CM | POA: Diagnosis not present

## 2016-04-11 LAB — URINALYSIS, ROUTINE W REFLEX MICROSCOPIC
Bilirubin, UA: NEGATIVE
Ketones, UA: NEGATIVE
Leukocytes, UA: NEGATIVE
Nitrite, UA: NEGATIVE
Protein, UA: NEGATIVE
RBC, UA: NEGATIVE
Specific Gravity, UA: 1.015 (ref 1.005–1.030)
Urobilinogen, Ur: 0.2 mg/dL (ref 0.2–1.0)
pH, UA: 5.5 (ref 5.0–7.5)

## 2016-04-11 LAB — MICROSCOPIC EXAMINATION
Bacteria, UA: NONE SEEN
Epithelial Cells (non renal): NONE SEEN /hpf (ref 0–10)
RBC, UA: NONE SEEN /hpf (ref 0–?)
WBC, UA: NONE SEEN /hpf (ref 0–?)

## 2016-04-11 MED ORDER — IBUPROFEN 800 MG PO TABS: 800.0000 mg | ORAL_TABLET | Freq: Three times a day (TID) | ORAL | 3 refills | Status: DC

## 2016-04-11 MED ORDER — HYDRALAZINE HCL 25 MG PO TABS: 25.0000 mg | ORAL_TABLET | Freq: Two times a day (BID) | ORAL | 4 refills | Status: DC

## 2016-04-11 MED ORDER — EZETIMIBE 10 MG PO TABS: 10.0000 mg | ORAL_TABLET | Freq: Every day | ORAL | 4 refills | Status: DC

## 2016-04-11 MED ORDER — COLESEVELAM HCL 625 MG PO TABS: 1875.0000 mg | ORAL_TABLET | Freq: Two times a day (BID) | ORAL | 4 refills | Status: DC

## 2016-04-11 MED ORDER — PANTOPRAZOLE SODIUM 40 MG PO TBEC: 40.0000 mg | DELAYED_RELEASE_TABLET | Freq: Every day | ORAL | 4 refills | Status: DC

## 2016-04-11 MED ORDER — VENLAFAXINE HCL ER 37.5 MG PO CP24: 37.5000 mg | ORAL_CAPSULE | Freq: Every day | ORAL | 4 refills | Status: DC

## 2016-04-11 NOTE — Assessment & Plan Note (Signed)
Stable and followed by endocrine

## 2016-04-11 NOTE — Assessment & Plan Note (Signed)
The current medical regimen is effective;  continue present plan and medications.  

## 2016-04-11 NOTE — Assessment & Plan Note (Signed)
Followed by endocrinology and had best A1c ever of 6.9.

## 2016-04-11 NOTE — Assessment & Plan Note (Signed)
Uses mask every night

## 2016-04-11 NOTE — Progress Notes (Signed)
BP 103/65 (BP Location: Left Arm, Patient Position: Sitting, Cuff Size: Normal)   Pulse 72   Temp 98.4 F (36.9 C)   Ht 5' 7.5" (1.715 m)   Wt 249 lb 6.4 oz (113.1 kg)   SpO2 99%   BMI 38.49 kg/m    Subjective:    Patient ID: Florene Route, male    DOB: 08/04/1955, 60 y.o.   MRN: [id number]  HPI: MARQUEL DENT is a 60 y.o. male  Annual wellness exam  Patient all in all doing well has lost weight diabetes doing great noted that it's ever done. Patient taking other medications without problems is having some fatigue issues but hypergonadism testosterone doing well using CPAP machine faithfully other medications conditions well controlled. On review patient does have a very full plate.   Relevant past medical, surgical, family and social history reviewed and updated as indicated. Interim medical history since our last visit reviewed. Allergies and medications reviewed and updated.  Review of Systems  Constitutional: Negative.   HENT: Negative.   Eyes: Negative.   Respiratory: Negative.   Cardiovascular: Negative.   Gastrointestinal: Negative.   Endocrine: Negative.   Genitourinary: Negative.   Musculoskeletal: Negative.   Skin: Negative.   Allergic/Immunologic: Negative.   Neurological: Negative.   Hematological: Negative.   Psychiatric/Behavioral: Negative.     Per HPI unless specifically indicated above     Objective:    BP 103/65 (BP Location: Left Arm, Patient Position: Sitting, Cuff Size: Normal)   Pulse 72   Temp 98.4 F (36.9 C)   Ht 5' 7.5" (1.715 m)   Wt 249 lb 6.4 oz (113.1 kg)   SpO2 99%   BMI 38.49 kg/m   Wt Readings from Last 3 Encounters:  04/11/16 249 lb 6.4 oz (113.1 kg)  10/06/15 250 lb (113.4 kg)  04/06/15 254 lb (115.2 kg)    Physical Exam  Constitutional: He is oriented to person, place, and time. He appears well-developed and well-nourished.  HENT:  Head: Normocephalic and atraumatic.  Right Ear: External ear normal.  Left  Ear: External ear normal.  Eyes: Conjunctivae and EOM are normal. Pupils are equal, round, and reactive to light.  Neck: Normal range of motion. Neck supple.  Cardiovascular: Normal rate, regular rhythm, normal heart sounds and intact distal pulses.   Pulmonary/Chest: Effort normal and breath sounds normal.  Abdominal: Soft. Bowel sounds are normal. There is no splenomegaly or hepatomegaly.  Genitourinary: Rectum normal, prostate normal and penis normal.  Musculoskeletal: Normal range of motion.  Neurological: He is alert and oriented to person, place, and time. He has normal reflexes.  Skin: No rash noted. No erythema.  Psychiatric: He has a normal mood and affect. His behavior is normal. Judgment and thought content normal.    Results for orders placed or performed in visit on [id number]  Basic metabolic panel  Result Value Ref Range   Glucose 139 mg/dL  Hemoglobin A1c  Result Value Ref Range   Hemoglobin A1C 6.9   TSH  Result Value Ref Range   TSH 1.63 0.41 - 5.90 uIU/mL      Assessment & Plan:   Problem List Items Addressed This Visit      Cardiovascular and Mediastinum   Hypertension    The current medical regimen is effective;  continue present plan and medications.       Relevant Medications   colesevelam (WELCHOL) 625 MG tablet   hydrALAZINE (APRESOLINE) 25 MG tablet   ezetimibe (ZETIA) 10  MG tablet   Other Relevant Orders   Comprehensive metabolic panel   CBC with Differential/Platelet   Urinalysis, Routine w reflex microscopic   CAD (coronary artery disease)    The current medical regimen is effective;  continue present plan and medications.       Relevant Medications   colesevelam (WELCHOL) 625 MG tablet   hydrALAZINE (APRESOLINE) 25 MG tablet   ezetimibe (ZETIA) 10 MG tablet   Other Relevant Orders   Comprehensive metabolic panel   Lipid panel   CBC with Differential/Platelet     Respiratory   Sleep apnea    Uses mask every night         Endocrine   Diabetes mellitus without complication ([place])    Followed by endocrinology and had best A1c ever of 6.9.      Relevant Medications   empagliflozin (JARDIANCE) 10 MG TABS tablet   metFORMIN (GLUCOPHAGE) 1000 MG tablet   Hypogonadism in male    Stable and followed by endocrine      Relevant Orders   Urinalysis, Routine w reflex microscopic     Other   Hyperlipidemia    The current medical regimen is effective;  continue present plan and medications.       Relevant Medications   colesevelam (WELCHOL) 625 MG tablet   hydrALAZINE (APRESOLINE) 25 MG tablet   ezetimibe (ZETIA) 10 MG tablet   Other Relevant Orders   Lipid panel    Other Visit Diagnoses    PE (physical exam), annual    -  Primary   Relevant Orders   Comprehensive metabolic panel   Lipid panel   CBC with Differential/Platelet   Urinalysis, Routine w reflex microscopic   PSA       Follow up plan: Return in about 6 months (around 10/10/2016) for BMP,  Lipids, ALT, AST.

## 2016-04-12 ENCOUNTER — Encounter: Payer: Self-pay | Admitting: Family Medicine

## 2016-04-12 LAB — CBC WITH DIFFERENTIAL/PLATELET
Basophils Absolute: 0 10*3/uL (ref 0.0–0.2)
Basos: 0 %
EOS (ABSOLUTE): 0.5 10*3/uL — ABNORMAL HIGH (ref 0.0–0.4)
Eos: 6 %
Hematocrit: 43.1 % (ref 37.5–51.0)
Hemoglobin: 14.5 g/dL (ref 13.0–17.7)
Immature Grans (Abs): 0 10*3/uL (ref 0.0–0.1)
Immature Granulocytes: 0 %
Lymphocytes Absolute: 2.2 10*3/uL (ref 0.7–3.1)
Lymphs: 25 %
MCH: 29.9 pg (ref 26.6–33.0)
MCHC: 33.6 g/dL (ref 31.5–35.7)
MCV: 89 fL (ref 79–97)
Monocytes Absolute: 0.6 10*3/uL (ref 0.1–0.9)
Monocytes: 7 %
Neutrophils Absolute: 5.7 10*3/uL (ref 1.4–7.0)
Neutrophils: 62 %
Platelets: 253 10*3/uL (ref 150–379)
RBC: 4.85 x10E6/uL (ref 4.14–5.80)
RDW: 13.5 % (ref 12.3–15.4)
WBC: 9.1 10*3/uL (ref 3.4–10.8)

## 2016-04-12 LAB — COMPREHENSIVE METABOLIC PANEL
ALT: 28 IU/L (ref 0–44)
AST: 23 IU/L (ref 0–40)
Albumin/Globulin Ratio: 2.1 (ref 1.2–2.2)
Albumin: 4.8 g/dL (ref 3.6–4.8)
Alkaline Phosphatase: 86 IU/L (ref 39–117)
BUN/Creatinine Ratio: 34 — ABNORMAL HIGH (ref 10–24)
BUN: 43 mg/dL — ABNORMAL HIGH (ref 8–27)
Bilirubin Total: 0.5 mg/dL (ref 0.0–1.2)
CO2: 19 mmol/L (ref 18–29)
Calcium: 10.2 mg/dL (ref 8.6–10.2)
Chloride: 106 mmol/L (ref 96–106)
Creatinine, Ser: 1.26 mg/dL (ref 0.76–1.27)
GFR calc Af Amer: 71 mL/min/{1.73_m2} (ref >59)
GFR calc non Af Amer: 61 mL/min/{1.73_m2} (ref >59)
Globulin, Total: 2.3 g/dL (ref 1.5–4.5)
Glucose: 97 mg/dL (ref 65–99)
Potassium: 4.4 mmol/L (ref 3.5–5.2)
Sodium: 144 mmol/L (ref 134–144)
Total Protein: 7.1 g/dL (ref 6.0–8.5)

## 2016-04-12 LAB — LIPID PANEL
Chol/HDL Ratio: 3.6 ratio units (ref 0.0–5.0)
Cholesterol, Total: 112 mg/dL (ref 100–199)
HDL: 31 mg/dL — ABNORMAL LOW (ref >39)
LDL Calculated: 61 mg/dL (ref 0–99)
Triglycerides: 99 mg/dL (ref 0–149)
VLDL Cholesterol Cal: 20 mg/dL (ref 5–40)

## 2016-04-12 LAB — PSA: Prostate Specific Ag, Serum: 0.2 ng/mL (ref 0.0–4.0)

## 2016-04-25 ENCOUNTER — Other Ambulatory Visit: Payer: Self-pay | Admitting: Family Medicine

## 2016-04-25 MED ORDER — VARENICLINE TARTRATE 0.5 MG X 11 & 1 MG X 42 PO MISC: ORAL | 0 refills | Status: DC

## 2016-05-20 ENCOUNTER — Other Ambulatory Visit: Payer: Self-pay | Admitting: Family Medicine

## 2016-05-21 NOTE — Telephone Encounter (Signed)
Routing to provider. Appt 10/10/16.

## 2016-07-11 ENCOUNTER — Encounter: Payer: Self-pay | Admitting: *Deleted

## 2016-07-13 NOTE — Discharge Instructions (Signed)

## 2016-07-16 ENCOUNTER — Ambulatory Visit: Payer: PRIVATE HEALTH INSURANCE | Admitting: Anesthesiology

## 2016-07-16 ENCOUNTER — Encounter
Admission: RE | Disposition: A | Discharge status: Home / Self Care | Payer: Self-pay | Source: Ambulatory Visit | Attending: Ophthalmology

## 2016-07-16 ENCOUNTER — Ambulatory Visit
Admission: RE | Admit: 2016-07-16 | Discharge: 2016-07-16 | Disposition: A | Discharge status: Home / Self Care | Payer: PRIVATE HEALTH INSURANCE | Source: Ambulatory Visit | Attending: Ophthalmology | Admitting: Ophthalmology

## 2016-07-16 DIAGNOSIS — G4730 Sleep apnea, unspecified: Secondary | ICD-10-CM | POA: Diagnosis not present

## 2016-07-16 DIAGNOSIS — Z7982 Long term (current) use of aspirin: Secondary | ICD-10-CM | POA: Insufficient documentation

## 2016-07-16 DIAGNOSIS — Z794 Long term (current) use of insulin: Secondary | ICD-10-CM | POA: Diagnosis not present

## 2016-07-16 DIAGNOSIS — F17200 Nicotine dependence, unspecified, uncomplicated: Secondary | ICD-10-CM | POA: Diagnosis not present

## 2016-07-16 DIAGNOSIS — E119 Type 2 diabetes mellitus without complications: Secondary | ICD-10-CM | POA: Diagnosis not present

## 2016-07-16 DIAGNOSIS — I2510 Atherosclerotic heart disease of native coronary artery without angina pectoris: Secondary | ICD-10-CM | POA: Insufficient documentation

## 2016-07-16 DIAGNOSIS — Z79899 Other long term (current) drug therapy: Secondary | ICD-10-CM | POA: Insufficient documentation

## 2016-07-16 DIAGNOSIS — H4089 Other specified glaucoma: Secondary | ICD-10-CM | POA: Diagnosis present

## 2016-07-16 DIAGNOSIS — I10 Essential (primary) hypertension: Secondary | ICD-10-CM | POA: Insufficient documentation

## 2016-07-16 DIAGNOSIS — Z9049 Acquired absence of other specified parts of digestive tract: Secondary | ICD-10-CM | POA: Diagnosis not present

## 2016-07-16 DIAGNOSIS — K219 Gastro-esophageal reflux disease without esophagitis: Secondary | ICD-10-CM | POA: Insufficient documentation

## 2016-07-16 DIAGNOSIS — F329 Major depressive disorder, single episode, unspecified: Secondary | ICD-10-CM | POA: Insufficient documentation

## 2016-07-16 HISTORY — PX: AQUEOUS SHUNT: SHX6305

## 2016-07-16 HISTORY — DX: Gastro-esophageal reflux disease without esophagitis: K21.9

## 2016-07-16 LAB — GLUCOSE, CAPILLARY
Glucose-Capillary: 191 mg/dL — ABNORMAL HIGH (ref 65–99)
Glucose-Capillary: 169 mg/dL — ABNORMAL HIGH (ref 65–99)

## 2016-07-16 SURGERY — INSERTION, AQUEOUS SHUNT, EYE
Anesthesia: Monitor Anesthesia Care | Site: Eye | Laterality: Left | Wound class: Clean

## 2016-07-16 MED ORDER — ACETAMINOPHEN 160 MG/5ML PO SOLN: 325.0000 mg | ORAL | Status: DC | PRN

## 2016-07-16 MED ORDER — BUPIVACAINE HCL (PF) 0.75 % IJ SOLN
INTRAMUSCULAR | Status: DC | PRN
  Administered 2016-07-16: 1 mL

## 2016-07-16 MED ORDER — ACETAMINOPHEN 325 MG PO TABS: 325.0000 mg | ORAL_TABLET | ORAL | Status: DC | PRN

## 2016-07-16 MED ORDER — MIDAZOLAM HCL 2 MG/2ML IJ SOLN
INTRAMUSCULAR | Status: DC | PRN
  Administered 2016-07-16: 2 mg via INTRAVENOUS

## 2016-07-16 MED ORDER — MOXIFLOXACIN HCL 0.5 % OP SOLN
1.0000 [drp] | OPHTHALMIC | Status: DC | PRN
  Administered 2016-07-16 (×3): 1 [drp] via OPHTHALMIC

## 2016-07-16 MED ORDER — TETRACAINE HCL 0.5 % OP SOLN
OPHTHALMIC | Status: DC | PRN
  Administered 2016-07-16: 2 [drp] via OPHTHALMIC

## 2016-07-16 MED ORDER — LIDOCAINE HCL (PF) 2 % IJ SOLN
INTRAMUSCULAR | Status: DC | PRN
  Administered 2016-07-16: 1 mL

## 2016-07-16 MED ORDER — NEOMYCIN-POLYMYXIN-DEXAMETH 3.5-10000-0.1 OP OINT
TOPICAL_OINTMENT | OPHTHALMIC | Status: DC | PRN
  Administered 2016-07-16: 1 via OPHTHALMIC

## 2016-07-16 MED ORDER — FENTANYL CITRATE (PF) 100 MCG/2ML IJ SOLN
INTRAMUSCULAR | Status: DC | PRN
  Administered 2016-07-16 (×2): 50 ug via INTRAVENOUS

## 2016-07-16 SURGICAL SUPPLY — 39 items
ALLOGRAFT TUTOPLST SCER0.5X1.0 (Tissue) ×1 IMPLANT
APL FBRTP 3 NS LF CTTN WD (MISCELLANEOUS) ×1
APPLICATOR COTTON TIP 3IN (MISCELLANEOUS) ×2 IMPLANT
BANDAGE EYE OVAL (MISCELLANEOUS) ×4 IMPLANT
BLADE SCLEROTME MULTI-SIDE (MISCELLANEOUS) IMPLANT
CANNULA ANT/CHMB 27GA (MISCELLANEOUS) ×4 IMPLANT
CORD BIP STRL DISP 12FT (MISCELLANEOUS) ×2 IMPLANT
CUP MEDICINE 2OZ PLAST GRAD ST (MISCELLANEOUS) ×2 IMPLANT
ERASER TAPRD BLUNT STR 20-23GA (MISCELLANEOUS) ×1 IMPLANT
GLOVE BIO SURGEON STRL SZ7 (GLOVE) ×2 IMPLANT
GLOVE SURG LX 6.5 MICRO (GLOVE) ×1
GLOVE SURG LX STRL 6.5 MICRO (GLOVE) ×1 IMPLANT
GOWN STRL REUS W/ TWL LRG LVL3 (GOWN DISPOSABLE) ×2 IMPLANT
GOWN STRL REUS W/TWL LRG LVL3 (GOWN DISPOSABLE) ×4
KNIFE OPTIMUM SIDEPORT 15DEG (MISCELLANEOUS) IMPLANT
KNIFE SIDECUT EYE (MISCELLANEOUS) ×2 IMPLANT
MARKER SKIN DUAL TIP RULER LAB (MISCELLANEOUS) ×2 IMPLANT
NDL SAFETY 22GX1.5 (NEEDLE) ×2 IMPLANT
NEEDLE FILTER BLUNT 18X 1/2SAF (NEEDLE) ×2
NEEDLE FILTER BLUNT 18X1 1/2 (NEEDLE) ×2 IMPLANT
NEEDLE HYPO 30X.5 LL (NEEDLE) IMPLANT
PACK EYE AFTER SURG (MISCELLANEOUS) ×2 IMPLANT
PACK OPTHALMIC (MISCELLANEOUS) ×2 IMPLANT
PROTECTOR LASIK FLAP (MISCELLANEOUS) ×2 IMPLANT
SOL BAL SALT 15ML (MISCELLANEOUS) ×4
SOLUTION BAL SALT 15ML (MISCELLANEOUS) ×2 IMPLANT
SPONGE SURG I SPEAR (MISCELLANEOUS) ×6 IMPLANT
SUT ETHILON 10-0 CS-B-6CS-B-6 (SUTURE)
SUT ETHILON 8 0 TG100 8 (SUTURE) ×2 IMPLANT
SUT VICRYL 7 0 TG140 8 (SUTURE) ×2 IMPLANT
SUT VICRYL 8 0 BV 130 5 (SUTURE) ×2 IMPLANT
SUTURE EHLN 10-0 CS-B-6CS-B-6 (SUTURE) IMPLANT
SYR 3ML LL SCALE MARK (SYRINGE) ×6 IMPLANT
SYRINGE 10CC LL (SYRINGE) ×2 IMPLANT
TAPERED BLUNT TIP STR 20-23GA (MISCELLANEOUS) ×2
TUTOPLAST SCIERA 0.5X1.0 (Tissue) ×2 IMPLANT
VALVE GLAUCOMA AHMED (Prosthesis & Implant Heart) ×2 IMPLANT
WATER STERILE IRR 250ML POUR (IV SOLUTION) ×2 IMPLANT
WIPE NON LINTING 3.25X3.25 (MISCELLANEOUS) ×2 IMPLANT

## 2016-07-16 NOTE — Anesthesia Procedure Notes (Signed)
Procedure Name: MAC Performed by: Osher Oettinger Pre-anesthesia Checklist: Patient identified, Emergency Drugs available, Suction available, Timeout performed and Patient being monitored Patient Re-evaluated:Patient Re-evaluated prior to inductionOxygen Delivery Method: Nasal cannula Placement Confirmation: positive ETCO2     

## 2016-07-16 NOTE — Transfer of Care (Signed)
Immediate Anesthesia Transfer of Care Note  Patient: Joshua MALIA Sr.  Procedure(s) Performed: Procedure(s) with comments: AQUEOUS SHUNT  left  diabetic (Left) - ahmed tube shunt diabetic - insulin sleep apnea  Patient Location: PACU  Anesthesia Type: MAC  Level of Consciousness: awake, alert  and patient cooperative  Airway and Oxygen Therapy: Patient Spontanous Breathing and Patient connected to supplemental oxygen  Post-op Assessment: Post-op Vital signs reviewed, Patient's Cardiovascular Status Stable, Respiratory Function Stable, Patent Airway and No signs of Nausea or vomiting  Post-op Vital Signs: Reviewed and stable  Complications: No apparent anesthesia complications

## 2016-07-16 NOTE — H&P (Signed)
H+P reviewed and is up to date, please see paper chart.  

## 2016-07-16 NOTE — Op Note (Signed)
Date of Surgery: 07/16/16  PREOPERATIVE DIAGNOSES: 1. Neovascular glaucoma, mild stage, left eye.  POSTOPERATIVE DIAGNOSES: 1. Same  PROCEDURE PERFORMED: 1. Ahmed drainage device placement, left eye. 2. Coverage of glaucoma drainage device with Tutoplast sclera, left eye.  SURGEON: Almon Hercules, MD.    ANESTHESIA: Monitored anesthesia care.  IMPLANTS:   Implant Name Type Inv. Item Serial No. Manufacturer Lot No. LRB No. Used  TUTOPLAST SCIERA 0.5X1.0 - [id number] Tissue TUTOPLAST SCIERA 0.5X1.0 [id number] RIT [id number] Left 1  VALVE GLAUCOMA AHMED - [id number] Prosthesis & Implant Heart VALVE GLAUCOMA AHMED [id number] [employer] [id number] Left 1    COMPLICATIONS: None.  DESCRIPTION OF PROCEDURE: After informed consent was obtained, the patient was brought to the operating room and placed in the supine position.  The patient was then prepped and draped in the usual sterile fashion for intraocular surgery on the right eye.  A wire lid speculum was placed.  A 7-0 vicryl suture was placed through the superotemporal limbal cornea and the eye was rotated to expose the superotemporal quadrant.  Using Westcott scissors, a small incision through conjunctiva and Tenons was made in the superotemporal quadrant approximately 4 mm posterior to the limbus. A block which consisted of 2 mL of 50% of 4% Xylocaine without epinephrine and 50% of 0.75% Marcaine was given at sub-Tenons level. Tenons were then dissected from sclera posteriorly and anteriorly with blunt Westcott dissection. Hemostasis was achieved with cautery.  An Ahmed drainage device, model FP7, was removed from its packaging, inspected, and found to be in good condition.  Balanced salt solution on a cannula was used to irrigate the tube, and free flow was noted above the plate. The implant was placed in the retrobulbar space between the superior and lateral rectus muscles and two 8-0 nylon sutures were placed through the eyelets of the implant.  A 22-gauge needle was used to enter the anterior chamber 3 mm from the superior limbus supero-temporally.  The tube was trimmed to length and placed through the needle tract and set to rest above the iris with no corneal touch noted.  The tube was then approximated to the globe using a single 8-0 nylon figure-of- eight suture.  Donor scleral overlay placed over the tube and sewn in place using 1 interrupted 7-0 vicryl suture.  The conjunctival incision was closed with running 8-0 Vicryl sutures.  At the end of the case, the corneal limbal traction suture was removed as was the wire lid speculum.  The eye was dressed with an application of Maxitrol ointment, and a Fox shield was placed.  The patient was brought to the recovery area having tolerated the procedure with no complications.

## 2016-07-16 NOTE — Anesthesia Preprocedure Evaluation (Signed)
Anesthesia Evaluation  Patient identified by MRN, date of birth, ID band Patient awake    Reviewed: Allergy & Precautions, H&P , NPO status , Patient's Chart, lab work & pertinent test results, reviewed documented beta blocker date and time   Airway Mallampati: II  TM Distance: >3 FB Neck ROM: full    Dental no notable dental hx.    Pulmonary sleep apnea and Continuous Positive Airway Pressure Ventilation , Current Smoker,    Pulmonary exam normal breath sounds clear to auscultation       Cardiovascular Exercise Tolerance: Good hypertension, + CAD   Rhythm:regular Rate:Normal     Neuro/Psych negative neurological ROS  negative psych ROS   GI/Hepatic Neg liver ROS, GERD  ,  Endo/Other  diabetes, Type 2  Renal/GU negative Renal ROS  negative genitourinary   Musculoskeletal   Abdominal   Peds  Hematology negative hematology ROS (+)   Anesthesia Other Findings   Reproductive/Obstetrics negative OB ROS                             Anesthesia Physical Anesthesia Plan  ASA: III  Anesthesia Plan: MAC   Post-op Pain Management:    Induction:   Airway Management Planned:   Additional Equipment:   Intra-op Plan:   Post-operative Plan:   Informed Consent: I have reviewed the patients History and Physical, chart, labs and discussed the procedure including the risks, benefits and alternatives for the proposed anesthesia with the patient or authorized representative who has indicated his/her understanding and acceptance.   Dental Advisory Given  Plan Discussed with: CRNA  Anesthesia Plan Comments:         Anesthesia Quick Evaluation

## 2016-07-16 NOTE — Anesthesia Postprocedure Evaluation (Signed)
Anesthesia Post Note  Patient: Joshua Route Sr.  Procedure(s) Performed: Procedure(s) (LRB): AQUEOUS SHUNT  left  diabetic (Left)  Patient location during evaluation: PACU Anesthesia Type: MAC Level of consciousness: awake and alert Pain management: pain level controlled Vital Signs Assessment: post-procedure vital signs reviewed and stable Respiratory status: spontaneous breathing, nonlabored ventilation, respiratory function stable and patient connected to nasal cannula oxygen Cardiovascular status: stable and blood pressure returned to baseline Anesthetic complications: no    Alisa Graff

## 2016-07-17 ENCOUNTER — Encounter: Payer: Self-pay | Admitting: Ophthalmology

## 2016-07-17 ENCOUNTER — Other Ambulatory Visit: Payer: Self-pay | Admitting: Family Medicine

## 2016-08-16 ENCOUNTER — Telehealth: Payer: Self-pay

## 2016-08-16 NOTE — Telephone Encounter (Signed)
Pt requesting rx for syringes and needles to give testosterone shot with. Medication is on current med list. Do not see where we have prescribed it. Please advise.    [facility] [address].

## 2016-08-16 NOTE — Telephone Encounter (Signed)
Per Dr. Rance Muir most recent OV note, his hypogonadism is managed by Endocrinology so he needs to request these supplies with them

## 2016-08-20 NOTE — Telephone Encounter (Signed)
Spoke with patient. He is aware. Has already contacted his Endo's office about refill.

## 2016-09-17 ENCOUNTER — Other Ambulatory Visit: Payer: Self-pay | Admitting: Family Medicine

## 2016-10-03 ENCOUNTER — Other Ambulatory Visit: Payer: Self-pay | Admitting: Family Medicine

## 2016-10-03 NOTE — Telephone Encounter (Signed)
Last OV: 04/11/16  Next OV: 10/10/16

## 2016-10-10 ENCOUNTER — Ambulatory Visit: Payer: PRIVATE HEALTH INSURANCE | Admitting: Family Medicine

## 2016-10-19 ENCOUNTER — Other Ambulatory Visit: Payer: Self-pay | Admitting: Family Medicine

## 2016-10-19 MED ORDER — ONDANSETRON HCL 4 MG PO TABS: 4.0000 mg | ORAL_TABLET | Freq: Three times a day (TID) | ORAL | 1 refills | Status: DC | PRN

## 2016-11-12 ENCOUNTER — Encounter: Payer: Self-pay | Admitting: Family Medicine

## 2016-11-12 ENCOUNTER — Ambulatory Visit (INDEPENDENT_AMBULATORY_CARE_PROVIDER_SITE_OTHER): Payer: BLUE CROSS/BLUE SHIELD | Admitting: Family Medicine

## 2016-11-12 VITALS — BP 111/71 | HR 68 | Wt 252.0 lb

## 2016-11-12 DIAGNOSIS — E119 Type 2 diabetes mellitus without complications: Secondary | ICD-10-CM

## 2016-11-12 DIAGNOSIS — E7800 Pure hypercholesterolemia, unspecified: Secondary | ICD-10-CM | POA: Diagnosis not present

## 2016-11-12 DIAGNOSIS — I10 Essential (primary) hypertension: Secondary | ICD-10-CM | POA: Diagnosis not present

## 2016-11-12 LAB — LP+ALT+AST PICCOLO, WAIVED
ALT (SGPT) Piccolo, Waived: 27 U/L (ref 10–47)
AST (SGOT) Piccolo, Waived: 35 U/L (ref 11–38)
Chol/HDL Ratio Piccolo,Waive: 5.6 mg/dL — ABNORMAL HIGH
Cholesterol Piccolo, Waived: 167 mg/dL (ref <200)
HDL Chol Piccolo, Waived: 30 mg/dL — ABNORMAL LOW (ref >59)
LDL Chol Calc Piccolo Waived: 95 mg/dL (ref <100)
Triglycerides Piccolo,Waived: 211 mg/dL — ABNORMAL HIGH (ref <150)
VLDL Chol Calc Piccolo,Waive: 42 mg/dL — ABNORMAL HIGH (ref <30)

## 2016-11-12 MED ORDER — GABAPENTIN 300 MG PO CAPS: 600.0000 mg | ORAL_CAPSULE | Freq: Two times a day (BID) | ORAL | 4 refills | Status: DC

## 2016-11-12 MED ORDER — ONDANSETRON HCL 4 MG PO TABS: 4.0000 mg | ORAL_TABLET | Freq: Three times a day (TID) | ORAL | 1 refills | Status: DC | PRN

## 2016-11-12 NOTE — Assessment & Plan Note (Signed)
The current medical regimen is effective;  continue present plan and medications.  

## 2016-11-12 NOTE — Assessment & Plan Note (Signed)
Poor control with weight gain discussed diet exercise nutrition use of medications faithfully

## 2016-11-12 NOTE — Progress Notes (Signed)
BP 111/71   Pulse 68   Wt 252 lb (114.3 kg)   SpO2 96%   BMI 38.89 kg/m    Subjective:    Patient ID: Joshua Route Sr., male    DOB: 1955-11-21, 61 y.o.   MRN: [id number]  HPI: Joshua MAKARA Sr. is a 61 y.o. male  Chief Complaint  Patient presents with  . Foot Pain    Left foot, x 2 weeks   . Follow-up  . Hypertension  Patient follow-up multiple issues. Blood pressures been doing well with no complaints from medications and good control of blood pressure. Diabetes followed by endocrinology and stable except for last time his A1c went up to 8.1. Patient noted his been drinking Sprite and eating ice cream. Patient's cholesterol was also gone up during this time  Has developed some right foot numbness and sensation of walking on the area of thickness in his forefoot area. Has developed some hammertoe  Nerves doing okay for now but having a lot of stress with his children. Still having occasional nausea with stress. Refill on nausea medications Cholesterol loss of control with weight gain Relevant past medical, surgical, family and social history reviewed and updated as indicated. Interim medical history since our last visit reviewed. Allergies and medications reviewed and updated.  Review of Systems  Constitutional: Negative.   Respiratory: Negative.   Cardiovascular: Negative.     Per HPI unless specifically indicated above     Objective:    BP 111/71   Pulse 68   Wt 252 lb (114.3 kg)   SpO2 96%   BMI 38.89 kg/m   Wt Readings from Last 3 Encounters:  11/12/16 252 lb (114.3 kg)  07/16/16 248 lb (112.5 kg)  04/11/16 249 lb 6.4 oz (113.1 kg)    Physical Exam  Constitutional: He is oriented to person, place, and time. He appears well-developed and well-nourished.  HENT:  Head: Normocephalic and atraumatic.  Eyes: Conjunctivae and EOM are normal.  Neck: Normal range of motion.  Cardiovascular: Normal rate, regular rhythm and normal heart sounds.     Pulmonary/Chest: Effort normal and breath sounds normal.  Musculoskeletal: Normal range of motion.  Neurological: He is alert and oriented to person, place, and time.  Skin: No erythema.  Psychiatric: He has a normal mood and affect. His behavior is normal. Judgment and thought content normal.  see diabetic foot exam 4 foot exam   Results for orders placed or performed during the hospital encounter of 07/16/16  Glucose, capillary  Result Value Ref Range   Glucose-Capillary 191 (H) 65 - 99 mg/dL  Glucose, capillary  Result Value Ref Range   Glucose-Capillary 169 (H) 65 - 99 mg/dL      Assessment & Plan:   Problem List Items Addressed This Visit      Cardiovascular and Mediastinum   Hypertension    The current medical regimen is effective;  continue present plan and medications.       Relevant Orders   Basic metabolic panel     Endocrine   Diabetes mellitus without complication ([place])    Not sure if diabetic neuropathy or developing some hammertoe abnormality will do a trial of gabapentin 300 mg at bedtime for a week may increase to 2 twice a day if no relief will consider podiatry referral.        Other   Hyperlipidemia - Primary    Poor control with weight gain discussed diet exercise nutrition use of medications faithfully  Relevant Orders   LP+ALT+AST [place], Waived       Follow up plan: Return in about 3 months (around 02/12/2017) for BMP,  Lipids, ALT, AST recheck medicine changes.

## 2016-11-12 NOTE — Assessment & Plan Note (Signed)
Not sure if diabetic neuropathy or developing some hammertoe abnormality will do a trial of gabapentin 300 mg at bedtime for a week may increase to 2 twice a day if no relief will consider podiatry referral.

## 2016-11-13 ENCOUNTER — Telehealth: Payer: Self-pay | Admitting: Family Medicine

## 2016-11-13 DIAGNOSIS — N183 Chronic kidney disease, stage 3 unspecified: Secondary | ICD-10-CM

## 2016-11-13 LAB — BASIC METABOLIC PANEL
BUN/Creatinine Ratio: 20 (ref 10–24)
BUN: 31 mg/dL — ABNORMAL HIGH (ref 8–27)
CO2: 23 mmol/L (ref 20–29)
Calcium: 9.7 mg/dL (ref 8.6–10.2)
Chloride: 100 mmol/L (ref 96–106)
Creatinine, Ser: 1.52 mg/dL — ABNORMAL HIGH (ref 0.76–1.27)
GFR calc Af Amer: 56 mL/min/{1.73_m2} — ABNORMAL LOW (ref >59)
GFR calc non Af Amer: 48 mL/min/{1.73_m2} — ABNORMAL LOW (ref >59)
Glucose: 129 mg/dL — ABNORMAL HIGH (ref 65–99)
Potassium: 5.1 mmol/L (ref 3.5–5.2)
Sodium: 141 mmol/L (ref 134–144)

## 2016-11-13 NOTE — Telephone Encounter (Signed)
Phone call Discussed with patient slight decline in renal function patient taking intermittent ibuprofen will discontinue that and aspirin that he is taking every day recheck BMP 1 month.

## 2016-12-03 ENCOUNTER — Ambulatory Visit: Payer: Self-pay | Admitting: Podiatry

## 2016-12-03 ENCOUNTER — Ambulatory Visit (INDEPENDENT_AMBULATORY_CARE_PROVIDER_SITE_OTHER): Payer: Self-pay | Admitting: Podiatry

## 2016-12-03 ENCOUNTER — Ambulatory Visit (INDEPENDENT_AMBULATORY_CARE_PROVIDER_SITE_OTHER): Payer: BLUE CROSS/BLUE SHIELD

## 2016-12-03 ENCOUNTER — Encounter: Payer: Self-pay | Admitting: Podiatry

## 2016-12-03 VITALS — BP 142/91 | HR 75 | Resp 18

## 2016-12-03 DIAGNOSIS — M2042 Other hammer toe(s) (acquired), left foot: Secondary | ICD-10-CM | POA: Diagnosis not present

## 2016-12-03 DIAGNOSIS — M779 Enthesopathy, unspecified: Secondary | ICD-10-CM

## 2016-12-03 NOTE — Progress Notes (Signed)
   Subjective:    Patient ID: Joshua Route Sr., male    DOB: 1956-02-24, 61 y.o.   MRN: [id number]  HPI: He presents today chief complaint of pain to the forefoot left. He states that is underneath the second toe. He states this and aching for about 2 months denies any trauma to it. There is redness and swelling he went to the primary care provider who diagnosed him with diabetic peripheral neuropathy. He states that he gave a medication but he did not take it because he did not feel that this was what his problem was.  Review of Systems  Musculoskeletal: Positive for gait problem.  All other systems reviewed and are negative.      Objective:   Physical Exam: Vital signs are stable alert and oriented 3. Pulses are palpable. Neurologic sensorium is intact. Deep tendon reflexes are intact. Muscle strength was 5 over 5 dorsiflexion plantar flexors and inverters everters all to the musculature is intact. Orthopedic evaluation demonstrates a cockup hammertoe deformity at the level of the second metatarsophalangeal joint left foot. There is medial deviation and frontal plane rotation. Radiographs demonstrate an elongated second metatarsal consistent with plantarflexed elongated second metatarsals and removed resulting in capsulitis. Obviously this has become a dislocation at this point in the proximal phalanx is located on the head of the second metatarsal.      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  Plan: At this point the area was swollen and painful so I injected it with dexamethasone and Kenalog today. This should help alleviate some of the symptoms. I will follow-up with him in 2-3 weeks for evaluation regarding his oscillating for surgical correction which we discussed in detail today.

## 2016-12-17 ENCOUNTER — Ambulatory Visit (INDEPENDENT_AMBULATORY_CARE_PROVIDER_SITE_OTHER): Payer: BLUE CROSS/BLUE SHIELD | Admitting: Podiatry

## 2016-12-17 ENCOUNTER — Encounter: Payer: Self-pay | Admitting: Podiatry

## 2016-12-17 DIAGNOSIS — M779 Enthesopathy, unspecified: Secondary | ICD-10-CM

## 2016-12-17 DIAGNOSIS — M2042 Other hammer toe(s) (acquired), left foot: Secondary | ICD-10-CM

## 2016-12-17 NOTE — Progress Notes (Signed)
Joshua Fowler presents today for follow-up of his capsulitis left foot. He states he was doing a whole lot better I would say approximately 90%  Objective: Vital signs are stable alert and oriented 3. Pulses are palpable. Neurologic sensorium is intact. Degenerative flexors are intact. Muscle strength normal. His pain on palpation and range of motion of the second metatarsophalangeal joint but much less than previously noted. Rigid hammertoe deformity and medial deviation of the toes resulted from dislocation of the second metatarsophalangeal joint.  Assessment: Complete rupture of the second metatarsophalangeal joint left foot.  Plan discussed surgical intervention with him today he understands this is amenable to it will follow up with me in a few weeks or months for surgical consult.

## 2016-12-20 ENCOUNTER — Other Ambulatory Visit: Payer: BLUE CROSS/BLUE SHIELD

## 2016-12-20 DIAGNOSIS — N183 Chronic kidney disease, stage 3 unspecified: Secondary | ICD-10-CM

## 2016-12-21 LAB — BASIC METABOLIC PANEL
BUN/Creatinine Ratio: 24 (ref 10–24)
BUN: 27 mg/dL (ref 8–27)
CO2: 22 mmol/L (ref 20–29)
Calcium: 9.6 mg/dL (ref 8.6–10.2)
Chloride: 97 mmol/L (ref 96–106)
Creatinine, Ser: 1.11 mg/dL (ref 0.76–1.27)
GFR calc Af Amer: 82 mL/min/{1.73_m2} (ref >59)
GFR calc non Af Amer: 71 mL/min/{1.73_m2} (ref >59)
Glucose: 227 mg/dL — ABNORMAL HIGH (ref 65–99)
Potassium: 4.7 mmol/L (ref 3.5–5.2)
Sodium: 139 mmol/L (ref 134–144)

## 2016-12-24 ENCOUNTER — Encounter: Payer: Self-pay | Admitting: Family Medicine

## 2016-12-25 LAB — HM DIABETES EYE EXAM

## 2017-01-16 ENCOUNTER — Ambulatory Visit: Payer: BLUE CROSS/BLUE SHIELD | Admitting: Podiatry

## 2017-01-17 ENCOUNTER — Ambulatory Visit (INDEPENDENT_AMBULATORY_CARE_PROVIDER_SITE_OTHER): Payer: BLUE CROSS/BLUE SHIELD | Admitting: Podiatry

## 2017-01-17 ENCOUNTER — Encounter: Payer: Self-pay | Admitting: Podiatry

## 2017-01-17 DIAGNOSIS — M779 Enthesopathy, unspecified: Secondary | ICD-10-CM | POA: Diagnosis not present

## 2017-01-17 DIAGNOSIS — M2042 Other hammer toe(s) (acquired), left foot: Secondary | ICD-10-CM | POA: Diagnosis not present

## 2017-01-17 NOTE — Progress Notes (Signed)
This patient presents the office with chief complaint of continued pain noted in his left forefoot.  He states he was an approximate 4 weeks ago and received an injection which helped but  his pain has now returned.  He presents the office today for continued evaluation and treatment of this painful capsulitis left forefoot.     GENERAL APPEARANCE: Alert, conversant. Appropriately groomed. No acute distress.  VASCULAR: Pedal pulses are  palpable at  [facility] and PT bilateral.  Capillary refill time is immediate to all digits,  Normal temperature gradient.  Digital hair growth is present bilateral  NEUROLOGIC: sensation is normal to 5.07 monofilament at 5/5 sites bilateral.  Light touch is intact bilateral, Muscle strength normal.  MUSCULOSKELETAL: acceptable muscle strength, tone and stability bilateral.  Intrinsic muscluature intact bilateral.  Rectus appearance of foot and digits noted bilateral. Plantarflexed second metatarsal with hammertoe deformity second digit left foot.  Palpable pain noted plantar aspect of the second metatarsal. HAV 1st MPJ  Left.  DERMATOLOGIC: skin color, texture, and turgor are within normal limits.  No preulcerative lesions or ulcers  are seen, no interdigital maceration noted.  No open lesions present.  Digital nails are asymptomatic. No drainage noted.  Capsulitis secondary to a complete rupture of the second MPJ left foot.  ROV  injection therapy, second MPJ left foot.  Reinforce the fact that this patient needs surgical correction for his ruptured second MPJ.  Return to clinic when necessary for his scheduled appointment with Dr. Karle Starch DPM

## 2017-01-28 ENCOUNTER — Other Ambulatory Visit: Payer: Self-pay | Admitting: Family Medicine

## 2017-01-28 ENCOUNTER — Ambulatory Visit: Payer: BLUE CROSS/BLUE SHIELD | Admitting: Podiatry

## 2017-04-22 ENCOUNTER — Other Ambulatory Visit: Payer: Self-pay | Admitting: Family Medicine

## 2017-04-22 DIAGNOSIS — E7800 Pure hypercholesterolemia, unspecified: Secondary | ICD-10-CM

## 2017-04-30 ENCOUNTER — Other Ambulatory Visit: Payer: Self-pay | Admitting: Family Medicine

## 2017-05-13 ENCOUNTER — Encounter: Payer: Self-pay | Admitting: Podiatry

## 2017-05-13 ENCOUNTER — Ambulatory Visit: Payer: BLUE CROSS/BLUE SHIELD | Admitting: Podiatry

## 2017-05-13 ENCOUNTER — Ambulatory Visit (INDEPENDENT_AMBULATORY_CARE_PROVIDER_SITE_OTHER): Payer: BLUE CROSS/BLUE SHIELD

## 2017-05-13 DIAGNOSIS — M2042 Other hammer toe(s) (acquired), left foot: Secondary | ICD-10-CM

## 2017-05-13 NOTE — Patient Instructions (Signed)
Pre-Operative Instructions  Congratulations, you have decided to take an important step towards improving your quality of life.  You can be assured that the doctors and staff at [facility] will be with you every step of the way.  Here are some important things you should know:  1. Plan to be at the surgery center/hospital at least 1 (one) hour prior to your scheduled time, unless otherwise directed by the surgical center/hospital staff.  You must have a responsible adult accompany you, remain during the surgery and drive you home.  Make sure you have directions to the surgical center/hospital to ensure you arrive on time. 2. If you are having surgery at [facility] or [facility] hospitals, you will need a copy of your medical history and physical form from your family physician within one month prior to the date of surgery. We will give you a form for your primary physician to complete.  3. We make every effort to accommodate the date you request for surgery.  However, there are times where surgery dates or times have to be moved.  We will contact you as soon as possible if a change in schedule is required.   4. No aspirin/ibuprofen for one week before surgery.  If you are on aspirin, any non-steroidal anti-inflammatory medications (Mobic, Aleve, Ibuprofen) should not be taken seven (7) days prior to your surgery.  You make take Tylenol for pain prior to surgery.  5. Medications - If you are taking daily heart and blood pressure medications, seizure, reflux, allergy, asthma, anxiety, pain or diabetes medications, make sure you notify the surgery center/hospital before the day of surgery so they can tell you which medications you should take or avoid the day of surgery. 6. No food or drink after midnight the night before surgery unless directed otherwise by surgical center/hospital staff. 7. No alcoholic beverages 24-hours prior to surgery.  No smoking 24-hours prior or 24-hours after  surgery. 8. Wear loose pants or shorts. They should be loose enough to fit over bandages, boots, and casts. 9. Don't wear slip-on shoes. Sneakers are preferred. 10. Bring your boot with you to the surgery center/hospital.  Also bring crutches or a walker if your physician has prescribed it for you.  If you do not have this equipment, it will be provided for you after surgery. 11. If you have not been contacted by the surgery center/hospital by the day before your surgery, call to confirm the date and time of your surgery. 12. Leave-time from work may vary depending on the type of surgery you have.  Appropriate arrangements should be made prior to surgery with your employer. 13. Prescriptions will be provided immediately following surgery by your doctor.  Fill these as soon as possible after surgery and take the medication as directed. Pain medications will not be refilled on weekends and must be approved by the doctor. 14. Remove nail polish on the operative foot and avoid getting pedicures prior to surgery. 15. Wash the night before surgery.  The night before surgery wash the foot and leg well with water and the antibacterial soap provided. Be sure to pay special attention to beneath the toenails and in between the toes.  Wash for at least three (3) minutes. Rinse thoroughly with water and dry well with a towel.  Perform this wash unless told not to do so by your physician.  Enclosed: 1 Ice pack (please put in freezer the night before surgery)   1 Hibiclens skin cleaner     Pre-op instructions  If you have any questions regarding the instructions, please do not hesitate to call our office.  [place]: [address], [place], [place] [address] -- [phone]  [place]: [address] [place], [place] [address] -- [phone]  [place]: [address]  [place], [place] [address] -- [phone]  [place]: [address], [address], [place], [place] [address] -- [phone]  Website:  [email or web address] 

## 2017-05-13 NOTE — Progress Notes (Signed)
He presents today would like to discuss surgical intervention for his left foot.  He states that he is fine to have his left shoulder surgically corrected as well and would like to overlap the healing times if at all possible.  He states that his left second toe is very painful.  Objective: Vital signs are stable alert and oriented x3 completely dislocated second metatarsophalangeal joint with hammertoe deformity.  Radiographs confirm this it also confirms what appears to be completely pulverized third metatarsal head that appears to be healing.  This could be associated with diabetes and peripheral neuropathy as a Charcot deformity.  Assessment: Complete dislocation of the second metatarsal phalangeal joint and second toe.  Plan: Discussed etiology pathology conservative surgical therapies at this point we will thoroughly discussed surgical intervention regarding a second metatarsal osteotomy and hammertoe repair.  I expressed to him that it would be significant possibility of losing this toe he understands this and is amenable to it.  We will perform a second metatarsal osteotomy hammertoe repair with pin or screw.  I will follow-up with him in the near future for surgical intervention.

## 2017-05-21 ENCOUNTER — Encounter: Payer: Self-pay | Admitting: Family Medicine

## 2017-05-21 ENCOUNTER — Ambulatory Visit: Payer: BLUE CROSS/BLUE SHIELD | Admitting: Family Medicine

## 2017-05-21 VITALS — BP 120/77 | HR 67 | Ht 68.5 in | Wt 253.0 lb

## 2017-05-21 DIAGNOSIS — E7800 Pure hypercholesterolemia, unspecified: Secondary | ICD-10-CM

## 2017-05-21 DIAGNOSIS — Z1329 Encounter for screening for other suspected endocrine disorder: Secondary | ICD-10-CM

## 2017-05-21 DIAGNOSIS — E291 Testicular hypofunction: Secondary | ICD-10-CM

## 2017-05-21 DIAGNOSIS — E119 Type 2 diabetes mellitus without complications: Secondary | ICD-10-CM

## 2017-05-21 DIAGNOSIS — I10 Essential (primary) hypertension: Secondary | ICD-10-CM

## 2017-05-21 DIAGNOSIS — I2510 Atherosclerotic heart disease of native coronary artery without angina pectoris: Secondary | ICD-10-CM

## 2017-05-21 DIAGNOSIS — I119 Hypertensive heart disease without heart failure: Secondary | ICD-10-CM

## 2017-05-21 DIAGNOSIS — Z0001 Encounter for general adult medical examination with abnormal findings: Secondary | ICD-10-CM

## 2017-05-21 LAB — URINALYSIS, ROUTINE W REFLEX MICROSCOPIC
Bilirubin, UA: NEGATIVE
Ketones, UA: NEGATIVE
Leukocytes, UA: NEGATIVE
Nitrite, UA: NEGATIVE
Protein, UA: NEGATIVE
RBC, UA: NEGATIVE
Specific Gravity, UA: 1.015 (ref 1.005–1.030)
Urobilinogen, Ur: 2 mg/dL — ABNORMAL HIGH (ref 0.2–1.0)
pH, UA: 6 (ref 5.0–7.5)

## 2017-05-21 MED ORDER — EZETIMIBE 10 MG PO TABS: 10.0000 mg | ORAL_TABLET | Freq: Every day | ORAL | 4 refills | Status: DC

## 2017-05-21 MED ORDER — PANTOPRAZOLE SODIUM 40 MG PO TBEC: 40.0000 mg | DELAYED_RELEASE_TABLET | Freq: Every day | ORAL | 4 refills | Status: DC

## 2017-05-21 MED ORDER — ONDANSETRON HCL 4 MG PO TABS: 4.0000 mg | ORAL_TABLET | Freq: Three times a day (TID) | ORAL | 1 refills | Status: DC | PRN

## 2017-05-21 MED ORDER — VENLAFAXINE HCL ER 37.5 MG PO CP24: 37.5000 mg | ORAL_CAPSULE | Freq: Every day | ORAL | 4 refills | Status: DC

## 2017-05-21 NOTE — Assessment & Plan Note (Signed)
The current medical regimen is effective;  continue present plan and medications.  

## 2017-05-21 NOTE — Assessment & Plan Note (Signed)
Followed by endocrinology and stable 

## 2017-05-21 NOTE — Progress Notes (Signed)
BP 120/77   Pulse 67   Ht 5' 8.5" (1.74 m)   Wt 253 lb (114.8 kg)   SpO2 95%   BMI 37.90 kg/m    Subjective:    Patient ID: Joshua Route Sr., male    DOB: [birth year], 62 y.o.   MRN: [id number]  HPI: Joshua ALESSIO Sr. is a 62 y.o. male  Chief Complaint  Patient presents with  . Annual Exam   Follow-up for physicals had an eventful year and ongoing problems.  Looking at having foot surgery at some time this winter also torn rotator cuff and looking at shoulder surgery.  Has pending workup. fortunately diabetes seems to be doing well Cholesterol stopped WelChol which seemed to help his congestion. For nerves taking Effexor without problems low dose and has Valium that he takes for inner ear which is only occasional. Patient also gets testosterone from endocrinology and is doing okay. Relevant past medical, surgical, family and social history reviewed and updated as indicated. Interim medical history since our last visit reviewed. Allergies and medications reviewed and updated.  Review of Systems  Constitutional: Negative.   HENT: Negative.   Eyes: Negative.   Respiratory: Negative.   Cardiovascular: Negative.   Gastrointestinal: Negative.   Endocrine: Negative.   Genitourinary: Negative.   Musculoskeletal: Negative.   Skin: Negative.   Allergic/Immunologic: Negative.   Neurological: Negative.   Hematological: Negative.   Psychiatric/Behavioral: Negative.     Per HPI unless specifically indicated above     Objective:    BP 120/77   Pulse 67   Ht 5' 8.5" (1.74 m)   Wt 253 lb (114.8 kg)   SpO2 95%   BMI 37.90 kg/m   Wt Readings from Last 3 Encounters:  05/21/17 253 lb (114.8 kg)  11/12/16 252 lb (114.3 kg)  07/16/16 248 lb (112.5 kg)    Physical Exam  Constitutional: He is oriented to person, place, and time. He appears well-developed and well-nourished.  HENT:  Head: Normocephalic and atraumatic.  Right Ear: External ear normal.  Left Ear:  External ear normal.  Eyes: Conjunctivae and EOM are normal. Pupils are equal, round, and reactive to light.  Neck: Normal range of motion. Neck supple.  Cardiovascular: Normal rate, regular rhythm, normal heart sounds and intact distal pulses.  Pulmonary/Chest: Effort normal and breath sounds normal.  Abdominal: Soft. Bowel sounds are normal. There is no splenomegaly or hepatomegaly.  Genitourinary: Rectum normal, prostate normal and penis normal.  Musculoskeletal: Normal range of motion.  Neurological: He is alert and oriented to person, place, and time. He has normal reflexes.  Skin: No rash noted. No erythema.  Psychiatric: He has a normal mood and affect. His behavior is normal. Judgment and thought content normal.    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       Relevant Medications   ezetimibe (ZETIA) 10 MG tablet   Other Relevant Orders   CBC with Differential/Platelet   Comprehensive metabolic panel   Lipid panel   Urinalysis, Routine w reflex microscopic   CAD (coronary artery disease)  The current medical regimen is effective;  continue present plan and medications.       Relevant Medications   ezetimibe (ZETIA) 10 MG tablet     Endocrine   Type 2 diabetes mellitus without complication (HCC)    Followed by endocrinology and stable      Relevant Orders   CBC with Differential/Platelet   Comprehensive metabolic panel   Lipid panel   Urinalysis, Routine w reflex microscopic   Hypogonadism in male   Relevant Orders   PSA     Other   Hyperlipidemia    The current medical regimen is effective;  continue present  plan and medications.       Relevant Medications   ezetimibe (ZETIA) 10 MG tablet   Other Relevant Orders   CBC with Differential/Platelet   Comprehensive metabolic panel   Lipid panel   Urinalysis, Routine w reflex microscopic    Other Visit Diagnoses    Thyroid disorder screen       Relevant Orders   TSH       Follow up plan: Return for BMP,  Lipids, ALT, AST.

## 2017-05-22 ENCOUNTER — Encounter: Payer: Self-pay | Admitting: Family Medicine

## 2017-05-22 LAB — COMPREHENSIVE METABOLIC PANEL
ALT: 28 IU/L (ref 0–44)
AST: 25 IU/L (ref 0–40)
Albumin/Globulin Ratio: 1.7 (ref 1.2–2.2)
Albumin: 4.8 g/dL (ref 3.6–4.8)
Alkaline Phosphatase: 109 IU/L (ref 39–117)
BUN/Creatinine Ratio: 23 (ref 10–24)
BUN: 24 mg/dL (ref 8–27)
Bilirubin Total: 0.5 mg/dL (ref 0.0–1.2)
CO2: 22 mmol/L (ref 20–29)
Calcium: 9.5 mg/dL (ref 8.6–10.2)
Chloride: 95 mmol/L — ABNORMAL LOW (ref 96–106)
Creatinine, Ser: 1.04 mg/dL (ref 0.76–1.27)
GFR calc Af Amer: 89 mL/min/{1.73_m2} (ref >59)
GFR calc non Af Amer: 77 mL/min/{1.73_m2} (ref >59)
Globulin, Total: 2.8 g/dL (ref 1.5–4.5)
Glucose: 226 mg/dL — ABNORMAL HIGH (ref 65–99)
Potassium: 4 mmol/L (ref 3.5–5.2)
Sodium: 140 mmol/L (ref 134–144)
Total Protein: 7.6 g/dL (ref 6.0–8.5)

## 2017-05-22 LAB — CBC WITH DIFFERENTIAL/PLATELET
Basophils Absolute: 0 10*3/uL (ref 0.0–0.2)
Basos: 1 %
EOS (ABSOLUTE): 0.2 10*3/uL (ref 0.0–0.4)
Eos: 4 %
Hematocrit: 46.6 % (ref 37.5–51.0)
Hemoglobin: 16 g/dL (ref 13.0–17.7)
Immature Grans (Abs): 0 10*3/uL (ref 0.0–0.1)
Immature Granulocytes: 0 %
Lymphocytes Absolute: 1.8 10*3/uL (ref 0.7–3.1)
Lymphs: 29 %
MCH: 32.4 pg (ref 26.6–33.0)
MCHC: 34.3 g/dL (ref 31.5–35.7)
MCV: 94 fL (ref 79–97)
Monocytes Absolute: 0.6 10*3/uL (ref 0.1–0.9)
Monocytes: 9 %
Neutrophils Absolute: 3.5 10*3/uL (ref 1.4–7.0)
Neutrophils: 57 %
Platelets: 230 10*3/uL (ref 150–379)
RBC: 4.94 x10E6/uL (ref 4.14–5.80)
RDW: 13.1 % (ref 12.3–15.4)
WBC: 6.1 10*3/uL (ref 3.4–10.8)

## 2017-05-22 LAB — LIPID PANEL
Chol/HDL Ratio: 5.3 ratio — ABNORMAL HIGH (ref 0.0–5.0)
Cholesterol, Total: 179 mg/dL (ref 100–199)
HDL: 34 mg/dL — ABNORMAL LOW (ref >39)
LDL Calculated: 95 mg/dL (ref 0–99)
Triglycerides: 249 mg/dL — ABNORMAL HIGH (ref 0–149)
VLDL Cholesterol Cal: 50 mg/dL — ABNORMAL HIGH (ref 5–40)

## 2017-05-22 LAB — TSH: TSH: 2.21 u[IU]/mL (ref 0.450–4.500)

## 2017-05-22 LAB — PSA: Prostate Specific Ag, Serum: 0.4 ng/mL (ref 0.0–4.0)

## 2017-05-28 ENCOUNTER — Other Ambulatory Visit: Payer: Self-pay | Admitting: Family Medicine

## 2017-07-04 ENCOUNTER — Telehealth: Payer: Self-pay | Admitting: Family Medicine

## 2017-07-04 NOTE — Telephone Encounter (Signed)
Pt was referred to [place] on 01/02/2011. Records printed and faxed.

## 2017-07-04 NOTE — Telephone Encounter (Signed)
Copied from [place]. Topic: Quick Communication - See Telephone Encounter >> Jul 04, 2017 11:43 AM Boyd Kerbs wrote: CRM for notification. See Telephone encounter for:   Caryl Pina from [facility] [phone]   Fax [phone] requesting his latest blood work .  Please fax  07/04/17.

## 2017-11-19 ENCOUNTER — Ambulatory Visit: Payer: BLUE CROSS/BLUE SHIELD | Admitting: Family Medicine

## 2017-11-26 ENCOUNTER — Ambulatory Visit: Payer: BLUE CROSS/BLUE SHIELD | Admitting: Family Medicine

## 2017-12-31 LAB — HM DIABETES EYE EXAM

## 2018-01-07 ENCOUNTER — Encounter: Payer: Self-pay | Admitting: Family Medicine

## 2018-01-07 ENCOUNTER — Other Ambulatory Visit: Payer: Self-pay | Admitting: Family Medicine

## 2018-01-07 DIAGNOSIS — E11319 Type 2 diabetes mellitus with unspecified diabetic retinopathy without macular edema: Secondary | ICD-10-CM | POA: Insufficient documentation

## 2018-01-08 NOTE — Telephone Encounter (Signed)
Zofran 4 mg refill Last Refill:10/07/17 # 90 Last OV: 05/21/17 PCP: [place]

## 2018-02-03 ENCOUNTER — Encounter: Payer: Self-pay | Admitting: Family Medicine

## 2018-02-03 ENCOUNTER — Ambulatory Visit (INDEPENDENT_AMBULATORY_CARE_PROVIDER_SITE_OTHER): Payer: BLUE CROSS/BLUE SHIELD | Admitting: Family Medicine

## 2018-02-03 VITALS — BP 127/71 | HR 71 | Ht 69.0 in | Wt 249.0 lb

## 2018-02-03 DIAGNOSIS — I10 Essential (primary) hypertension: Secondary | ICD-10-CM | POA: Diagnosis not present

## 2018-02-03 DIAGNOSIS — E7800 Pure hypercholesterolemia, unspecified: Secondary | ICD-10-CM

## 2018-02-03 DIAGNOSIS — Z23 Encounter for immunization: Secondary | ICD-10-CM | POA: Diagnosis not present

## 2018-02-03 DIAGNOSIS — M545 Low back pain, unspecified: Secondary | ICD-10-CM | POA: Insufficient documentation

## 2018-02-03 LAB — LP+ALT+AST PICCOLO, WAIVED
ALT (SGPT) Piccolo, Waived: 41 U/L (ref 10–47)
AST (SGOT) Piccolo, Waived: 31 U/L (ref 11–38)
Chol/HDL Ratio Piccolo,Waive: 4.7 mg/dL
Cholesterol Piccolo, Waived: 172 mg/dL (ref <200)
HDL Chol Piccolo, Waived: 36 mg/dL — ABNORMAL LOW (ref >59)
LDL Chol Calc Piccolo Waived: 76 mg/dL (ref <100)
Triglycerides Piccolo,Waived: 297 mg/dL — ABNORMAL HIGH (ref <150)
VLDL Chol Calc Piccolo,Waive: 59 mg/dL — ABNORMAL HIGH (ref <30)

## 2018-02-03 MED ORDER — IBUPROFEN 800 MG PO TABS: 800.0000 mg | ORAL_TABLET | Freq: Three times a day (TID) | ORAL | 3 refills | Status: DC

## 2018-02-03 MED ORDER — SILDENAFIL CITRATE 20 MG PO TABS: 20.0000 mg | ORAL_TABLET | ORAL | 12 refills | Status: DC | PRN

## 2018-02-03 NOTE — Patient Instructions (Signed)

## 2018-02-03 NOTE — Assessment & Plan Note (Signed)
The current medical regimen is effective;  continue present plan and medications.  

## 2018-02-03 NOTE — Assessment & Plan Note (Addendum)
Rt leg radicular pain Continue chiropractic care will not refer to orthopedics yet as patient showing improvement and help with chiropractic care.

## 2018-02-03 NOTE — Progress Notes (Signed)
BP 127/71   Pulse 71   Ht 5\' 9"  (1.753 m)   Wt 249 lb (112.9 kg)   SpO2 93%   BMI 36.77 kg/m    Subjective:    Patient ID: Joshua Route Sr., male    DOB: 02/09/1956, 62 y.o.   MRN: [id number]  HPI: Joshua FARRELLY Sr. is a 62 y.o. male  Chief Complaint  Patient presents with  . Follow-up  . Hypertension  . Hyperlipidemia  . Back Pain    Lower right side x 3 weeks pt requesting MRI  Patient follow-up has had a round of prednisone 50 mg for 5 days for low back radicular pain has been to chiropractor has had radicular pain down the posterior right leg with some neurologic findings of weak heel stepping especially on his right.  Still having some weakness of his right side with some radicular pain but is improved with the prednisone. Patient's diabetes seem to be doing okay with the prednisone.  Patient was aware that this would make his diabetes worse. Other medical problems hypertension CAD cholesterol all seem to be doing okay.   Relevant past medical, surgical, family and social history reviewed and updated as indicated. Interim medical history since our last visit reviewed. Allergies and medications reviewed and updated.  Review of Systems  Constitutional: Negative.   Respiratory: Negative.   Cardiovascular: Negative.     Per HPI unless specifically indicated above     Objective:    BP 127/71   Pulse 71   Ht 5\' 9"  (1.753 m)   Wt 249 lb (112.9 kg)   SpO2 93%   BMI 36.77 kg/m   Wt Readings from Last 3 Encounters:  02/03/18 249 lb (112.9 kg)  05/21/17 253 lb (114.8 kg)  11/12/16 252 lb (114.3 kg)    Physical Exam  Constitutional: He is oriented to person, place, and time. He appears well-developed and well-nourished.  HENT:  Head: Normocephalic and atraumatic.  Eyes: Conjunctivae and EOM are normal.  Neck: Normal range of motion.  Cardiovascular: Normal rate, regular rhythm and normal heart sounds.  Pulmonary/Chest: Effort normal and breath sounds  normal.  Musculoskeletal: Normal range of motion.  Neurological: He is alert and oriented to person, place, and time.  Skin: No erythema.  Psychiatric: He has a normal mood and affect. His behavior is normal. Judgment and thought content normal.    Results for orders placed or performed in visit on 01/07/18  HM DIABETES EYE EXAM  Result Value Ref Range   HM Diabetic Eye Exam Retinopathy (A) No Retinopathy      Assessment & Plan:   Problem List Items Addressed This Visit      Cardiovascular and Mediastinum   Essential hypertension - Primary    The current medical regimen is effective;  continue present plan and medications.       Relevant Orders   Basic metabolic panel   LP+ALT+AST Piccolo, Waived     Other   Hyperlipidemia    The current medical regimen is effective;  continue present plan and medications.       Relevant Orders   Basic metabolic panel   LP+ALT+AST Piccolo, Waived   Back pain at L4-L5 level    Rt leg radicular pain Continue chiropractic care will not refer to orthopedics yet as patient showing improvement and help with chiropractic care.       Other Visit Diagnoses    Needs flu shot  Relevant Orders   Flu Vaccine QUAD 6+ mos PF IM (Fluarix Quad PF) (Completed)       Follow up plan: Return in about 6 months (around 08/04/2018) for Physical Exam.

## 2018-02-04 ENCOUNTER — Encounter: Payer: Self-pay | Admitting: Family Medicine

## 2018-02-04 LAB — BASIC METABOLIC PANEL
BUN/Creatinine Ratio: 27 — ABNORMAL HIGH (ref 10–24)
BUN: 31 mg/dL — ABNORMAL HIGH (ref 8–27)
CO2: 25 mmol/L (ref 20–29)
Calcium: 9.4 mg/dL (ref 8.6–10.2)
Chloride: 95 mmol/L — ABNORMAL LOW (ref 96–106)
Creatinine, Ser: 1.13 mg/dL (ref 0.76–1.27)
GFR calc Af Amer: 80 mL/min/{1.73_m2} (ref >59)
GFR calc non Af Amer: 69 mL/min/{1.73_m2} (ref >59)
Glucose: 194 mg/dL — ABNORMAL HIGH (ref 65–99)
Potassium: 4.6 mmol/L (ref 3.5–5.2)
Sodium: 140 mmol/L (ref 134–144)

## 2018-03-03 ENCOUNTER — Telehealth: Payer: Self-pay | Admitting: Podiatry

## 2018-03-03 NOTE — Telephone Encounter (Signed)
Pt left message asking about getting a pair of inserts. He has gotten a pair a while ago.  I looked in Dr Stephenie Acres notes and there was nothing about orthotics in the notes. Called pt and left message for pt to call and schedule an appt with Dr Milinda Pointer and Liliane Channel on a Wednesday so we can get another pair of orthotics made.

## 2018-03-04 ENCOUNTER — Other Ambulatory Visit: Payer: Self-pay | Admitting: Family Medicine

## 2018-04-10 ENCOUNTER — Ambulatory Visit: Admit: 2018-04-10 | Discharge: 2018-04-11 | Payer: PRIVATE HEALTH INSURANCE

## 2018-04-10 DIAGNOSIS — I10 Essential (primary) hypertension: Secondary | ICD-10-CM

## 2018-04-10 DIAGNOSIS — E785 Hyperlipidemia, unspecified: Secondary | ICD-10-CM

## 2018-04-10 DIAGNOSIS — F17200 Nicotine dependence, unspecified, uncomplicated: Secondary | ICD-10-CM

## 2018-04-10 DIAGNOSIS — F1111 Opioid abuse, in remission: Principal | ICD-10-CM

## 2018-04-10 DIAGNOSIS — E221 Hyperprolactinemia: Secondary | ICD-10-CM

## 2018-04-10 DIAGNOSIS — Z794 Long term (current) use of insulin: Secondary | ICD-10-CM

## 2018-04-10 DIAGNOSIS — E291 Testicular hypofunction: Secondary | ICD-10-CM

## 2018-04-10 DIAGNOSIS — D126 Benign neoplasm of colon, unspecified: Secondary | ICD-10-CM

## 2018-04-10 DIAGNOSIS — I2510 Atherosclerotic heart disease of native coronary artery without angina pectoris: Secondary | ICD-10-CM

## 2018-04-10 DIAGNOSIS — E113293 Type 2 diabetes mellitus with mild nonproliferative diabetic retinopathy without macular edema, bilateral: Secondary | ICD-10-CM

## 2018-04-11 MED ORDER — ASPIRIN 81 MG TABLET,DELAYED RELEASE
ORAL_TABLET | Freq: Every day | ORAL | 2 refills | 0 days
Start: 2018-04-11 — End: 2019-04-11

## 2018-05-04 MED ORDER — BUPRENORPHINE HCL 8 MG SUBLINGUAL TABLET
ORAL_TABLET | Freq: Two times a day (BID) | SUBLINGUAL | 0 refills | 0 days | Status: CP
Start: 2018-05-04 — End: 2018-05-22

## 2018-05-23 ENCOUNTER — Ambulatory Visit: Admit: 2018-05-23 | Discharge: 2018-05-24 | Payer: PRIVATE HEALTH INSURANCE

## 2018-05-23 DIAGNOSIS — F1111 Opioid abuse, in remission: Principal | ICD-10-CM

## 2018-05-23 DIAGNOSIS — F1199 Opioid use, unspecified with unspecified opioid-induced disorder: Secondary | ICD-10-CM

## 2018-05-23 DIAGNOSIS — E785 Hyperlipidemia, unspecified: Secondary | ICD-10-CM

## 2018-05-23 DIAGNOSIS — I10 Essential (primary) hypertension: Secondary | ICD-10-CM

## 2018-05-23 DIAGNOSIS — E221 Hyperprolactinemia: Secondary | ICD-10-CM

## 2018-05-23 DIAGNOSIS — Z1211 Encounter for screening for malignant neoplasm of colon: Secondary | ICD-10-CM

## 2018-05-23 DIAGNOSIS — Z794 Long term (current) use of insulin: Secondary | ICD-10-CM

## 2018-05-23 DIAGNOSIS — F17200 Nicotine dependence, unspecified, uncomplicated: Secondary | ICD-10-CM

## 2018-05-23 DIAGNOSIS — E113293 Type 2 diabetes mellitus with mild nonproliferative diabetic retinopathy without macular edema, bilateral: Secondary | ICD-10-CM

## 2018-05-23 MED ORDER — BUPRENORPHINE HCL 8 MG SUBLINGUAL TABLET: 8 mg | tablet | Freq: Two times a day (BID) | 0 refills | 0 days | Status: AC

## 2018-05-26 ENCOUNTER — Other Ambulatory Visit: Payer: Self-pay | Admitting: Family Medicine

## 2018-05-27 MED ORDER — BUSPIRONE 15 MG TABLET
ORAL_TABLET | Freq: Two times a day (BID) | ORAL | 1 refills | 0.00000 days | Status: CP
Start: 2018-05-27 — End: 2018-11-23

## 2018-05-27 MED ORDER — EZETIMIBE 10 MG TABLET
ORAL_TABLET | Freq: Every day | ORAL | 1 refills | 0 days | Status: CP
Start: 2018-05-27 — End: 2018-11-23

## 2018-05-27 MED ORDER — OLMESARTAN 40 MG-AMLODIPINE 10 MG-HYDROCHLOROTHIAZIDE 25 MG TABLET
ORAL_TABLET | Freq: Every day | ORAL | 1 refills | 0 days | Status: CP
Start: 2018-05-27 — End: 2018-11-23

## 2018-05-27 MED ORDER — VASCEPA 1 GRAM CAPSULE
ORAL_CAPSULE | 0 refills | 0 days | Status: CP
Start: 2018-05-27 — End: ?

## 2018-05-27 MED ORDER — VENLAFAXINE ER 37.5 MG CAPSULE,EXTENDED RELEASE 24 HR
ORAL_CAPSULE | Freq: Every day | ORAL | 1 refills | 0 days | Status: CP
Start: 2018-05-27 — End: 2018-11-23

## 2018-06-03 MED ORDER — BUPRENORPHINE HCL 8 MG SUBLINGUAL TABLET
ORAL_TABLET | Freq: Two times a day (BID) | SUBLINGUAL | 0 refills | 0.00000 days | Status: CP
Start: 2018-06-03 — End: 2018-07-03

## 2018-07-03 ENCOUNTER — Ambulatory Visit: Admit: 2018-07-03 | Discharge: 2018-07-04 | Payer: PRIVATE HEALTH INSURANCE

## 2018-07-03 DIAGNOSIS — F1199 Opioid use, unspecified with unspecified opioid-induced disorder: Secondary | ICD-10-CM

## 2018-07-03 DIAGNOSIS — R05 Cough: Secondary | ICD-10-CM

## 2018-07-03 DIAGNOSIS — F1111 Opioid abuse, in remission: Secondary | ICD-10-CM

## 2018-07-03 DIAGNOSIS — Z79899 Other long term (current) drug therapy: Secondary | ICD-10-CM

## 2018-07-03 DIAGNOSIS — R0982 Postnasal drip: Secondary | ICD-10-CM

## 2018-07-03 DIAGNOSIS — Z0000 Encounter for general adult medical examination without abnormal findings: Secondary | ICD-10-CM

## 2018-07-03 DIAGNOSIS — Z125 Encounter for screening for malignant neoplasm of prostate: Secondary | ICD-10-CM

## 2018-07-03 DIAGNOSIS — E785 Hyperlipidemia, unspecified: Secondary | ICD-10-CM

## 2018-07-03 DIAGNOSIS — F17200 Nicotine dependence, unspecified, uncomplicated: Secondary | ICD-10-CM

## 2018-07-03 DIAGNOSIS — Z23 Encounter for immunization: Secondary | ICD-10-CM

## 2018-07-03 DIAGNOSIS — I10 Essential (primary) hypertension: Principal | ICD-10-CM

## 2018-07-03 DIAGNOSIS — C4492 Squamous cell carcinoma of skin, unspecified: Secondary | ICD-10-CM

## 2018-07-03 HISTORY — DX: Squamous cell carcinoma of skin, unspecified: C44.92

## 2018-07-03 MED ORDER — BUPRENORPHINE HCL 8 MG SUBLINGUAL TABLET
ORAL_TABLET | Freq: Two times a day (BID) | SUBLINGUAL | 1 refills | 0 days | Status: CP
Start: 2018-07-03 — End: 2018-08-31

## 2018-07-23 ENCOUNTER — Other Ambulatory Visit: Payer: Self-pay

## 2018-07-23 ENCOUNTER — Ambulatory Visit: Payer: BLUE CROSS/BLUE SHIELD | Admitting: Podiatry

## 2018-07-23 ENCOUNTER — Encounter: Payer: Self-pay | Admitting: Podiatry

## 2018-07-23 DIAGNOSIS — M2042 Other hammer toe(s) (acquired), left foot: Secondary | ICD-10-CM

## 2018-07-23 NOTE — Patient Instructions (Signed)
Pre-Operative Instructions  Congratulations, you have decided to take an important step towards improving your quality of life.  You can be assured that the doctors and staff at [facility] will be with you every step of the way.  Here are some important things you should know:  1. Plan to be at the surgery center/hospital at least 1 (one) hour prior to your scheduled time, unless otherwise directed by the surgical center/hospital staff.  You must have a responsible adult accompany you, remain during the surgery and drive you home.  Make sure you have directions to the surgical center/hospital to ensure you arrive on time. 2. If you are having surgery at [facility] or [facility], you will need a copy of your medical history and physical form from your family physician within one month prior to the date of surgery. We will give you a form for your primary physician to complete.  3. We make every effort to accommodate the date you request for surgery.  However, there are times where surgery dates or times have to be moved.  We will contact you as soon as possible if a change in schedule is required.   4. No aspirin/ibuprofen for one week before surgery.  If you are on aspirin, any non-steroidal anti-inflammatory medications (Mobic, Aleve, Ibuprofen) should not be taken seven (7) days prior to your surgery.  You make take Tylenol for pain prior to surgery.  5. Medications - If you are taking daily heart and blood pressure medications, seizure, reflux, allergy, asthma, anxiety, pain or diabetes medications, make sure you notify the surgery center/hospital before the day of surgery so they can tell you which medications you should take or avoid the day of surgery. 6. No food or drink after midnight the night before surgery unless directed otherwise by surgical center/hospital staff. 7. No alcoholic beverages 24-hours prior to surgery.  No smoking 24-hours prior or 24-hours after  surgery. 8. Wear loose pants or shorts. They should be loose enough to fit over bandages, boots, and casts. 9. Don't wear slip-on shoes. Sneakers are preferred. 10. Bring your boot with you to the surgery center/hospital.  Also bring crutches or a walker if your physician has prescribed it for you.  If you do not have this equipment, it will be provided for you after surgery. 11. If you have not been contacted by the surgery center/hospital by the day before your surgery, call to confirm the date and time of your surgery. 12. Leave-time from work may vary depending on the type of surgery you have.  Appropriate arrangements should be made prior to surgery with your employer. 13. Prescriptions will be provided immediately following surgery by your doctor.  Fill these as soon as possible after surgery and take the medication as directed. Pain medications will not be refilled on weekends and must be approved by the doctor. 14. Remove nail polish on the operative foot and avoid getting pedicures prior to surgery. 15. Wash the night before surgery.  The night before surgery wash the foot and leg well with water and the antibacterial soap provided. Be sure to pay special attention to beneath the toenails and in between the toes.  Wash for at least three (3) minutes. Rinse thoroughly with water and dry well with a towel.  Perform this wash unless told not to do so by your physician.  Enclosed: 1 Ice pack (please put in freezer the night before surgery)   1 Hibiclens skin cleaner     Pre-op instructions  If you have any questions regarding the instructions, please do not hesitate to call our office.  [place]: [address], [place], [place] [address] -- [phone]  [place]: [address] [place], [place] [address] -- [phone]  [place]: [address]  [place], [place] [address] -- [phone]  [place]: [address], [address], [place],  [address] -- [phone]  Website:  [email or web address] 

## 2018-07-23 NOTE — Progress Notes (Signed)
He presents today to once again discuss his surgical consult regarding his hammertoe deformity second left third left and fourth left.  He states that these are getting to the point where I can hardly handle it anymore.  Objective: Vital signs are stable he is alert oriented x3 he has pain on palpation of the second and third metatarsophalangeal joints large reactive hyperkeratotic lesion plantar aspect of the second metatarsal phalangeal joint with severe hammertoe deformity rigid in nature.  Pulses are palpable.  Assessment: Hammertoe deformities 2 3 and 4 with plantarflexed metatarsals 2 and 3 left.  Plan: At this point we consented him today for a second metatarsal osteotomy possible third metatarsal osteotomy hammertoe repair with screws #2 #3 #4 as well as pins.  He understands this is amenable to it we will discuss possible postop complications which may include but not limited to postop pain bleeding swelling infection recurrence need for further surgery overcorrection under correction loss of digit loss of limb loss of life.  He understands this is amenable to it understands that with diabetes this will take a longer time for this to heal.  He like to have this done as soon as possible.  I expressed to him that he should not have any plans for the next 12 weeks after surgery he understands that.  We did dispense a cam walker previously provided him with his paperwork once again today discussing the surgery center anesthesia group and providing him with instructions for the morning of preop.

## 2018-08-06 ENCOUNTER — Ambulatory Visit (INDEPENDENT_AMBULATORY_CARE_PROVIDER_SITE_OTHER): Payer: Self-pay | Admitting: Family Medicine

## 2018-08-06 ENCOUNTER — Encounter: Payer: Self-pay | Admitting: Family Medicine

## 2018-08-06 ENCOUNTER — Other Ambulatory Visit: Payer: Self-pay

## 2018-08-06 DIAGNOSIS — E113553 Type 2 diabetes mellitus with stable proliferative diabetic retinopathy, bilateral: Secondary | ICD-10-CM

## 2018-08-06 DIAGNOSIS — I10 Essential (primary) hypertension: Secondary | ICD-10-CM

## 2018-08-06 DIAGNOSIS — E291 Testicular hypofunction: Secondary | ICD-10-CM

## 2018-08-06 DIAGNOSIS — Z794 Long term (current) use of insulin: Secondary | ICD-10-CM

## 2018-08-06 DIAGNOSIS — I2510 Atherosclerotic heart disease of native coronary artery without angina pectoris: Secondary | ICD-10-CM

## 2018-08-06 DIAGNOSIS — E7800 Pure hypercholesterolemia, unspecified: Secondary | ICD-10-CM

## 2018-08-06 MED ORDER — SILDENAFIL CITRATE 20 MG PO TABS: 20.0000 mg | ORAL_TABLET | ORAL | 12 refills | Status: DC | PRN

## 2018-08-06 MED ORDER — PANTOPRAZOLE SODIUM 40 MG PO TBEC: 40.0000 mg | DELAYED_RELEASE_TABLET | Freq: Every day | ORAL | 4 refills | Status: DC

## 2018-08-06 MED ORDER — GABAPENTIN 300 MG PO CAPS: 300.0000 mg | ORAL_CAPSULE | Freq: Three times a day (TID) | ORAL | 3 refills | Status: DC

## 2018-08-06 MED ORDER — EZETIMIBE 10 MG PO TABS: 10.0000 mg | ORAL_TABLET | Freq: Every day | ORAL | 4 refills | Status: DC

## 2018-08-06 MED ORDER — ONDANSETRON HCL 4 MG PO TABS: 4.0000 mg | ORAL_TABLET | Freq: Two times a day (BID) | ORAL | 1 refills | Status: DC

## 2018-08-06 NOTE — Assessment & Plan Note (Signed)
The current medical regimen is effective;  continue present plan and medications.  

## 2018-08-06 NOTE — Assessment & Plan Note (Signed)
The current medical regimen is effective;  continue present plan and medications. a 

## 2018-08-06 NOTE — Progress Notes (Signed)
BP 126/67 (BP Location: Left Arm, Patient Position: Sitting, Cuff Size: Normal)   Ht 5\' 9"  (1.753 m)   Wt 242 lb (109.8 kg)   BMI 35.74 kg/m    Subjective:    Patient ID: Joshua Route Sr., male    DOB: 1955-09-12, 63 y.o.   MRN: [id number]  HPI: Joshua NATTER Sr. is a 63 y.o. male  Telemedicine using audio and telecommunications for a synchronous communication visit. Today's visit due to COVID-19 isolation precautions I connected with Joshua Fowler and verified that I am speaking with the correct person using two identifiers.   I discussed the limitations, risks, security and privacy concerns of performing an evaluation and management service by telephone and the availability of in person appointments. I also discussed with the patient that there may be a patient responsible charge related to this service. The patient expressed understanding and agreed to proceed. The patient's location is home. I am at home. Discussed with patient diabetes control and testosterone replacement being managed by endocrinology and doing okay.  Reviewed chart and last A1c was just over 7. Discussed blood pressure which is been doing well with good blood pressure control. Cholesterol doing well with Setia no complaints. Patient trying to lose weight and is down to 242. Reflux stable on medication and wants a refill on his nausea medicine for PRN use.  Relevant past medical, surgical, family and social history reviewed and updated as indicated. Interim medical history since our last visit reviewed. Allergies and medications reviewed and updated.  Review of Systems  Constitutional: Negative.   HENT: Negative.   Eyes: Negative.   Respiratory: Negative.   Cardiovascular: Negative.   Gastrointestinal: Negative.   Endocrine: Negative.   Genitourinary: Negative.   Musculoskeletal: Negative.   Skin: Negative.   Allergic/Immunologic: Negative.   Neurological: Negative.   Hematological:  Negative.   Psychiatric/Behavioral: Negative.     Per HPI unless specifically indicated above     Objective:    BP 126/67 (BP Location: Left Arm, Patient Position: Sitting, Cuff Size: Normal)   Ht 5\' 9"  (1.753 m)   Wt 242 lb (109.8 kg)   BMI 35.74 kg/m   Wt Readings from Last 3 Encounters:  08/06/18 242 lb (109.8 kg)  02/03/18 249 lb (112.9 kg)  05/21/17 253 lb (114.8 kg)    Physical Exam  none Results for orders placed or performed in visit on 55/73/22  Basic metabolic panel  Result Value Ref Range   Glucose 194 (H) 65 - 99 mg/dL   BUN 31 (H) 8 - 27 mg/dL   Creatinine, Ser 1.13 0.76 - 1.27 mg/dL   GFR calc non Af Amer 69 >59 mL/min/1.73   GFR calc Af Amer 80 >59 mL/min/1.73   BUN/Creatinine Ratio 27 (H) 10 - 24   Sodium 140 134 - 144 mmol/L   Potassium 4.6 3.5 - 5.2 mmol/L   Chloride 95 (L) 96 - 106 mmol/L   CO2 25 20 - 29 mmol/L   Calcium 9.4 8.6 - 10.2 mg/dL  LP+ALT+AST Piccolo, Waived  Result Value Ref Range   ALT (SGPT) Piccolo, Waived 41 10 - 47 U/L   AST (SGOT) Piccolo, Waived 31 11 - 38 U/L   Cholesterol Piccolo, Waived 172 <200 mg/dL   HDL Chol Piccolo, Waived 36 (L) >59 mg/dL   Triglycerides Piccolo,Waived 297 (H) <150 mg/dL   Chol/HDL Ratio Piccolo,Waive 4.7 mg/dL   LDL Chol Calc Piccolo Waived 76 <100 mg/dL   VLDL Chol Calc  Piccolo,Waive 59 (H) <30 mg/dL      Assessment & Plan:   Problem List Items Addressed This Visit      Cardiovascular and Mediastinum   Essential hypertension    The current medical regimen is effective;  continue present plan and medications.       CAD (coronary artery disease)    The current medical regimen is effective;  continue present plan and medications.         Endocrine   Hypogonadism in male    The current medical regimen is effective;  continue present plan and medications.       Diabetes mellitus type 2 with retinopathy ([place])    The current medical regimen is effective;  continue present plan and  medications.         Other   Hyperlipidemia    The current medical regimen is effective;  continue present plan and medications. a        I discussed the assessment and treatment plan with the patient. The patient was provided an opportunity to ask questions and all were answered. The patient agreed with the plan and demonstrated an understanding of the instructions.   The patient was advised to call back or seek an in-person evaluation if the symptoms worsen or if the condition fails to improve as anticipated.   I provided 21+ minutes of time during this encounter.  Follow up plan: Return in about 3 months (around 11/05/2018) for Physical Exam.

## 2018-09-01 ENCOUNTER — Institutional Professional Consult (permissible substitution): Admit: 2018-09-01 | Discharge: 2018-09-02 | Payer: PRIVATE HEALTH INSURANCE

## 2018-09-01 DIAGNOSIS — F1199 Opioid use, unspecified with unspecified opioid-induced disorder: Secondary | ICD-10-CM

## 2018-09-01 DIAGNOSIS — G4700 Insomnia, unspecified: Principal | ICD-10-CM

## 2018-09-01 MED ORDER — BUPRENORPHINE HCL 8 MG SUBLINGUAL TABLET
ORAL_TABLET | 1 refills | 0 days | Status: CP
Start: 2018-09-01 — End: 2018-09-02

## 2018-09-01 MED ORDER — ZOLPIDEM 10 MG TABLET
ORAL_TABLET | Freq: Every evening | ORAL | 0 refills | 0 days | Status: CP | PRN
Start: 2018-09-01 — End: 2018-10-02

## 2018-09-02 MED ORDER — BUPRENORPHINE HCL 8 MG SUBLINGUAL TABLET
ORAL_TABLET | 1 refills | 0 days | Status: CP
Start: 2018-09-02 — End: 2018-10-01

## 2018-09-08 ENCOUNTER — Institutional Professional Consult (permissible substitution): Admit: 2018-09-08 | Discharge: 2018-09-09 | Payer: PRIVATE HEALTH INSURANCE

## 2018-09-08 DIAGNOSIS — F1199 Opioid use, unspecified with unspecified opioid-induced disorder: Principal | ICD-10-CM

## 2018-09-10 ENCOUNTER — Telehealth: Payer: Self-pay | Admitting: *Deleted

## 2018-09-10 NOTE — Telephone Encounter (Signed)
I am calling to see if you would like to schedule your surgery with Dr. Milinda Pointer.  "I would.  What does he have available, did you say this Friday?"  He can do it this Friday.  "I tell you what.  I noticed another place on my foot that I'd like him to look at that may need surgery.  Can you schedule me for next Friday and get me in for an appointment with Dr. Milinda Pointer so he can look at this place?"  I sure can.  Dr. Milinda Pointer can see you on Wednesday, May 13 at 10:15 am.  I'll schedule you for surgery on 09/19/2018.  "Okay, 10:15 will be fine for the appointment.  If he wants to do surgery on this other place how do we go about handling that?"  Dr. Milinda Pointer will let me know if he wants to add any other procedures to your surgery.

## 2018-09-16 ENCOUNTER — Telehealth: Payer: Self-pay | Admitting: *Deleted

## 2018-09-16 NOTE — Telephone Encounter (Signed)
I received an email from [place] at [facility] stating the patient wants to reschedule his surgery.  I told her I would call the patient to see if he wants to reschedule.  Nonah Mattes Date of Birth: December 10, 1955, Age: 63 [place] [address]) Medical Record # [id number] Procedure: METATARSAL OSTEOTOMY 2ND METATARSAL LEFT FOOT WITH SCREW FIXATION on 09/19/2018 Physician: Rhona Leavens, [place], [place] Home: [phone]   Checklist - Journal    Print Notes    Date  Time  Staff  Action   09/16/2018  10:20:04  Rhetta Mura  Progress Note, rescheduling, emailed cs  Thanks,  Rhetta Mura RN [facility] Anesthesia Assessment Nurse ------------------------------------------------------------------------------------------------------------------------------------------------------------  I received a message from [place] at the surgical center.  She said you want to reschedule your surgery.  "Yes, something came up and I have to go out of town next week.  Plus I want him to look at this other place on my foot.  I think it is what's bothering me.  I put some of those pads around it and it has taken the pressure off of it.  It feels much better.  I'm scheduled to see him tomorrow.  I'm hoping he can do something to it tomorrow, trim the callus down a little bit, when I come in to see him."  I will cancel your surgery that's scheduled for Friday, 09/19/2018.  I canceled the surgery for 09/19/2018 via the surgical center's [facility] [employer] Portal.

## 2018-09-17 ENCOUNTER — Encounter: Payer: Self-pay | Admitting: Podiatry

## 2018-09-17 ENCOUNTER — Ambulatory Visit: Payer: Self-pay | Admitting: Podiatry

## 2018-09-17 ENCOUNTER — Other Ambulatory Visit: Payer: Self-pay

## 2018-09-17 VITALS — Temp 98.2°F

## 2018-09-17 DIAGNOSIS — Q828 Other specified congenital malformations of skin: Secondary | ICD-10-CM

## 2018-09-17 NOTE — Progress Notes (Signed)
He presents today to discuss surgery once again and also to have me trim his hyperkeratotic lesion.  States that the padding has really helped offload his foot which is made a big difference in the degree of his pain.  Objective: Pulses are palpable.  Neurologic sensorium is intact plantarflexed second and third metatarsals of the left foot resulting in reactive hyperkeratotic lesion plantarly.  Hammertoe deformities 2 3 and 4 of the left foot are prominent.  Assessment: Reactive hyperkeratotic lesion sub-2 with plantarflexed second and third metatarsals and hammertoes #2 #3 #4 the left foot.  Plan: Debridement of reactive hyperkeratotic tissue rescheduled for surgery.

## 2018-10-02 ENCOUNTER — Institutional Professional Consult (permissible substitution): Admit: 2018-10-02 | Discharge: 2018-10-03 | Payer: PRIVATE HEALTH INSURANCE

## 2018-10-02 ENCOUNTER — Other Ambulatory Visit: Payer: Self-pay | Admitting: Family Medicine

## 2018-10-02 DIAGNOSIS — F17200 Nicotine dependence, unspecified, uncomplicated: Secondary | ICD-10-CM

## 2018-10-02 DIAGNOSIS — F1199 Opioid use, unspecified with unspecified opioid-induced disorder: Principal | ICD-10-CM

## 2018-10-02 DIAGNOSIS — G4700 Insomnia, unspecified: Secondary | ICD-10-CM

## 2018-10-02 MED ORDER — AZITHROMYCIN 250 MG PO TABS: ORAL_TABLET | ORAL | 0 refills | Status: DC

## 2018-10-02 MED ORDER — ZOLPIDEM 10 MG TABLET
ORAL_TABLET | 0 refills | 0 days | Status: CP
Start: 2018-10-02 — End: 2018-12-23

## 2018-10-13 LAB — HM DIABETES EYE EXAM

## 2018-10-29 MED ORDER — BUPRENORPHINE HCL 8 MG SUBLINGUAL TABLET
ORAL_TABLET | 0 refills | 0 days | Status: CP
Start: 2018-10-29 — End: 2018-11-23

## 2018-11-03 ENCOUNTER — Other Ambulatory Visit: Payer: Self-pay | Admitting: Family Medicine

## 2018-11-24 ENCOUNTER — Ambulatory Visit: Admit: 2018-11-24 | Discharge: 2018-11-25 | Payer: PRIVATE HEALTH INSURANCE

## 2018-11-24 DIAGNOSIS — F1199 Opioid use, unspecified with unspecified opioid-induced disorder: Principal | ICD-10-CM

## 2018-11-24 DIAGNOSIS — Z23 Encounter for immunization: Secondary | ICD-10-CM

## 2018-11-24 DIAGNOSIS — G4700 Insomnia, unspecified: Secondary | ICD-10-CM

## 2018-11-24 MED ORDER — BUPRENORPHINE HCL 8 MG SUBLINGUAL TABLET
ORAL_TABLET | 1 refills | 0 days | Status: CP
Start: 2018-11-24 — End: 2019-01-25

## 2018-12-23 ENCOUNTER — Other Ambulatory Visit: Payer: Self-pay | Admitting: Family Medicine

## 2018-12-23 MED ORDER — ZOLPIDEM 10 MG TABLET
0 refills | 0 days | Status: CP
Start: 2018-12-23 — End: ?

## 2018-12-23 NOTE — Telephone Encounter (Signed)
Dr.Crissman do you want to continue this medication, if so please send in a refill.

## 2018-12-23 NOTE — Telephone Encounter (Signed)
Please advise if Dr. Jeananne Rama wants to continue to rx?

## 2019-01-20 DIAGNOSIS — G4700 Insomnia, unspecified: Secondary | ICD-10-CM

## 2019-01-20 DIAGNOSIS — F411 Generalized anxiety disorder: Secondary | ICD-10-CM

## 2019-01-20 MED ORDER — ZOLPIDEM 10 MG TABLET
0 refills | 0 days | Status: CP
Start: 2019-01-20 — End: ?

## 2019-01-21 ENCOUNTER — Other Ambulatory Visit: Payer: Self-pay | Admitting: Family Medicine

## 2019-01-21 DIAGNOSIS — F411 Generalized anxiety disorder: Secondary | ICD-10-CM

## 2019-01-21 NOTE — Telephone Encounter (Signed)
Requested medication (s) are due for refill today: no  Requested medication (s) are on the active medication list: no  Last refill:  Unknown,   Future visit scheduled: No  Notes to clinic:  Unable to refill per protocol. Medication not on current med list    Requested Prescriptions  Pending Prescriptions Disp Refills   venlafaxine XR (EFFEXOR-XR) 37.5 MG 24 hr capsule [Pharmacy Med Name: VENLAFAXINE HCL ER 37.5 MG CAP] 90 capsule     Sig: TAKE 1 CAPSULE BY MOUTH DAILY     Psychiatry: Antidepressants - SNRI - desvenlafaxine & venlafaxine Failed - 01/21/2019  5:20 PM      Failed - Triglycerides in normal range and within 360 days    Triglycerides Piccolo,Waived  Date Value Ref Range Status  02/03/2018 297 (H) <150 mg/dL Final    Comment:                            Normal                   <150                         Borderline High     150 - 199                         High                200 - 499                         Very High                >499          Failed - Completed PHQ-2 or PHQ-9 in the last 360 days.      Passed - LDL in normal range and within 360 days    LDL Calculated  Date Value Ref Range Status  05/21/2017 95 0 - 99 mg/dL Final         Passed - Total Cholesterol in normal range and within 360 days    Cholesterol Piccolo, Waived  Date Value Ref Range Status  02/03/2018 172 <200 mg/dL Final    Comment:                            Desirable                <200                         Borderline High      200- 239                         High                     >239          Passed - Last BP in normal range    BP Readings from Last 1 Encounters:  08/06/18 126/67         Passed - Valid encounter within last 6 months    Recent Outpatient Visits          5 months ago Pure hypercholesterolemia   [facility] Crissman, Jeannette How, MD  11 months ago Essential hypertension   [place] Crissman, Jeannette How, MD   1 year ago  Essential hypertension   [place], Jeannette How, MD   2 years ago Pure hypercholesterolemia   [facility] Crissman, Jeannette How, MD   2 years ago PE (physical exam), annual   [facility] Crissman, Jeannette How, MD

## 2019-01-22 NOTE — Telephone Encounter (Signed)
See PEC message. Looks like patient has established with [facility].

## 2019-02-05 ENCOUNTER — Encounter: Admit: 2019-02-05 | Discharge: 2019-02-06 | Payer: BLUE CROSS/BLUE SHIELD

## 2019-02-05 DIAGNOSIS — F1199 Opioid use, unspecified with unspecified opioid-induced disorder: Secondary | ICD-10-CM

## 2019-02-05 DIAGNOSIS — M545 Low back pain: Secondary | ICD-10-CM

## 2019-02-05 DIAGNOSIS — G4700 Insomnia, unspecified: Secondary | ICD-10-CM

## 2019-02-05 MED ORDER — PREDNISONE 50 MG TABLET
ORAL_TABLET | Freq: Every day | ORAL | 0 refills | 5 days | Status: CP
Start: 2019-02-05 — End: 2019-02-10

## 2019-02-17 MED ORDER — BUPRENORPHINE HCL 8 MG SUBLINGUAL TABLET
ORAL_TABLET | 1 refills | 0 days | Status: CP
Start: 2019-02-17 — End: 2019-04-07

## 2019-03-24 ENCOUNTER — Encounter: Payer: Self-pay | Admitting: Family Medicine

## 2019-03-24 DIAGNOSIS — I5189 Other ill-defined heart diseases: Secondary | ICD-10-CM

## 2019-03-24 HISTORY — DX: Other ill-defined heart diseases: I51.89

## 2019-03-26 ENCOUNTER — Ambulatory Visit: Admit: 2019-03-26 | Discharge: 2019-03-27 | Payer: PRIVATE HEALTH INSURANCE

## 2019-03-26 DIAGNOSIS — M545 Low back pain: Principal | ICD-10-CM

## 2019-03-26 DIAGNOSIS — G4700 Insomnia, unspecified: Principal | ICD-10-CM

## 2019-03-26 DIAGNOSIS — F1199 Opioid use, unspecified with unspecified opioid-induced disorder: Principal | ICD-10-CM

## 2019-04-08 DIAGNOSIS — D099 Carcinoma in situ, unspecified: Secondary | ICD-10-CM

## 2019-04-08 HISTORY — DX: Carcinoma in situ, unspecified: D09.9

## 2019-04-22 MED ORDER — BUPRENORPHINE HCL 8 MG SUBLINGUAL TABLET
ORAL_TABLET | 1 refills | 0 days | Status: CP
Start: 2019-04-22 — End: 2019-05-22

## 2019-05-11 ENCOUNTER — Telehealth: Payer: Self-pay | Admitting: Podiatry

## 2019-05-11 NOTE — Telephone Encounter (Signed)
Called pt to let him know I would need to get him an appointment scheduled to come in for a consult and to resign his consent forms. Pt stated he understood and since it's winter he has decided to go ahead with his surgery. I scheduled the pt for Wednesday, 20 January at 8:45 am. Told him he could be given a surgery date that day.

## 2019-05-11 NOTE — Telephone Encounter (Signed)
I'm calling to see if I can get scheduled to have surgery this Friday with Dr. Milinda Pointer. We have been talking about me having surgery for some time now. Please call me back at [phone]. Thank you.

## 2019-05-27 ENCOUNTER — Ambulatory Visit: Payer: BC Managed Care – PPO | Admitting: Podiatry

## 2019-05-27 ENCOUNTER — Telehealth: Payer: Self-pay | Admitting: Podiatry

## 2019-05-27 ENCOUNTER — Other Ambulatory Visit: Payer: Self-pay

## 2019-05-27 ENCOUNTER — Encounter: Payer: Self-pay | Admitting: Podiatry

## 2019-05-27 DIAGNOSIS — M2042 Other hammer toe(s) (acquired), left foot: Secondary | ICD-10-CM

## 2019-05-27 NOTE — Telephone Encounter (Signed)
Called pt to let him know I have him scheduled for sx next Friday, 01/29. I told him he could go ahead and register online with the surgical center. I told him once I received his paperwork tomorrow from [place], I would send them his signed consent forms. I also told him they would call him next Wednesday or Thursday with his sx time and what time to arrive for pre-op. Told him to reach out to me with any questions he may have before surgery.

## 2019-05-27 NOTE — Patient Instructions (Signed)
Pre-Operative Instructions  Congratulations, you have decided to take an important step towards improving your quality of life.  You can be assured that the doctors and staff at [facility] will be with you every step of the way.  Here are some important things you should know:  1. Plan to be at the surgery center/hospital at least 1 (one) hour prior to your scheduled time, unless otherwise directed by the surgical center/hospital staff.  You must have a responsible adult accompany you, remain during the surgery and drive you home.  Make sure you have directions to the surgical center/hospital to ensure you arrive on time. 2. If you are having surgery at [facility] or [facility], you will need a copy of your medical history and physical form from your family physician within one month prior to the date of surgery. We will give you a form for your primary physician to complete.  3. We make every effort to accommodate the date you request for surgery.  However, there are times where surgery dates or times have to be moved.  We will contact you as soon as possible if a change in schedule is required.   4. No aspirin/ibuprofen for one week before surgery.  If you are on aspirin, any non-steroidal anti-inflammatory medications (Mobic, Aleve, Ibuprofen) should not be taken seven (7) days prior to your surgery.  You make take Tylenol for pain prior to surgery.  5. Medications - If you are taking daily heart and blood pressure medications, seizure, reflux, allergy, asthma, anxiety, pain or diabetes medications, make sure you notify the surgery center/hospital before the day of surgery so they can tell you which medications you should take or avoid the day of surgery. 6. No food or drink after midnight the night before surgery unless directed otherwise by surgical center/hospital staff. 7. No alcoholic beverages 24-hours prior to surgery.  No smoking 24-hours prior or 24-hours after  surgery. 8. Wear loose pants or shorts. They should be loose enough to fit over bandages, boots, and casts. 9. Don't wear slip-on shoes. Sneakers are preferred. 10. Bring your boot with you to the surgery center/hospital.  Also bring crutches or a walker if your physician has prescribed it for you.  If you do not have this equipment, it will be provided for you after surgery. 11. If you have not been contacted by the surgery center/hospital by the day before your surgery, call to confirm the date and time of your surgery. 12. Leave-time from work may vary depending on the type of surgery you have.  Appropriate arrangements should be made prior to surgery with your employer. 13. Prescriptions will be provided immediately following surgery by your doctor.  Fill these as soon as possible after surgery and take the medication as directed. Pain medications will not be refilled on weekends and must be approved by the doctor. 14. Remove nail polish on the operative foot and avoid getting pedicures prior to surgery. 15. Wash the night before surgery.  The night before surgery wash the foot and leg well with water and the antibacterial soap provided. Be sure to pay special attention to beneath the toenails and in between the toes.  Wash for at least three (3) minutes. Rinse thoroughly with water and dry well with a towel.  Perform this wash unless told not to do so by your physician.  Enclosed: 1 Ice pack (please put in freezer the night before surgery)   1 Hibiclens skin cleaner     Pre-op instructions  If you have any questions regarding the instructions, please do not hesitate to call our office.  [place]: [address], [place], [place] [address] -- [phone]  [place]: [address] [place], [place] [address] -- [phone]  [place]: [address], [place], [place] [address] -- [phone]   Website: [email or web address] 

## 2019-05-27 NOTE — Progress Notes (Signed)
He presents today wanting to discuss his surgery to his left foot.  He states that he is currently wanting to have his second toe removed rather than the metatarsals and the toes corrected because he does not want the extended healing time.  He states that he has had no change in his past medical history medications or allergies he is seeing his cardiologist today expecting to have a good outcome.  He states that his blood sugars under good control last a A1c was at 7.2 he said.  ROS: Denies fever chills nausea muscle aches pains calf pain back pain chest pain shortness of breath.  Meds: Meds were reviewed today.  Allergies: Lotensin and statins.  Objective: Vital signs are stable he is alert oriented x3.  Pulses are palpable.  Hammertoe deformity 234 left foot #2 is rigid left foot with a plantarflexed second metatarsal resulting in a reactive hyperkeratotic lesion to the plantar aspect of the left foot.  This is exquisitely painful for him.  And I reviewed radiographs demonstrating the same.  Pulses remain strongly palpable and capillary fill time is immediate there are no broken down areas of skin no lesions no wounds.  Assessment: Hammertoe deformity plantarflexed second metatarsal left foot.  Flexible hammertoe deformities 3 and 4 of the left foot.  Plan: Discussed etiology pathology conservative surgical therapies at this point in time he would like to consider amputation of the second toe at the level of the metatarsophalangeal joint and a second met osteotomy or ostectomy.  Flexor tenotomies to toes #3 #4 the left foot.  I answered all the questions regarding these procedures best my billing layman's terms she understood was amenable to it signed all 3 pages of the consent form and I will follow-up with him in the near future for surgical intervention.  He has a cam boot at home he has information regarding the surgery center anesthesia group and instructions for the morning of surgery.  I will  follow-up with him to date of surgery which I believe is next Friday.

## 2019-05-28 ENCOUNTER — Telehealth: Payer: Self-pay | Admitting: Podiatry

## 2019-05-28 NOTE — Telephone Encounter (Signed)
DOS: 06/05/2019  SURGICAL PROCEDURES: Ostect. Comp. Met. Head 2nd [id number]), Tenotomy Single 3rd & 4th [phone]), and Amputation Toe MPJ Joint 2nd [id number]).  BCBS Policy Effective : 34/10/8401  -  05/06/2198  Member Liability Summary       In-Network   Max Per Benefit Period Year-to-Date Remaining     CoInsurance 20%       Deductible $500.00  $500.00     Out-Of-Pocket 3 $1500.00 $1281.30 3 Out-of-Pocket includes copay, deductible, and coinsurance.  [place] Not Applicable 35%  per  Service Year    Authorization Required No

## 2019-06-02 DIAGNOSIS — F1199 Opioid use, unspecified with unspecified opioid-induced disorder: Principal | ICD-10-CM

## 2019-06-02 DIAGNOSIS — G4700 Insomnia, unspecified: Principal | ICD-10-CM

## 2019-06-02 MED ORDER — ZOLPIDEM 10 MG TABLET
0 refills | 0 days | Status: CP
Start: 2019-06-02 — End: ?

## 2019-06-03 ENCOUNTER — Other Ambulatory Visit: Payer: Self-pay | Admitting: Podiatry

## 2019-06-03 MED ORDER — CEPHALEXIN 500 MG PO CAPS: 500.0000 mg | ORAL_CAPSULE | Freq: Three times a day (TID) | ORAL | 0 refills | Status: DC

## 2019-06-03 MED ORDER — IBUPROFEN 600 MG PO TABS: 600.0000 mg | ORAL_TABLET | Freq: Three times a day (TID) | ORAL | 1 refills | Status: DC | PRN

## 2019-06-03 MED ORDER — ONDANSETRON HCL 4 MG PO TABS: 4.0000 mg | ORAL_TABLET | Freq: Three times a day (TID) | ORAL | 0 refills | Status: DC | PRN

## 2019-06-03 MED ORDER — BUPRENORPHINE HCL 8 MG SUBLINGUAL TABLET
ORAL_TABLET | 0 refills | 23 days | Status: CP
Start: 2019-06-03 — End: 2019-06-26

## 2019-06-05 ENCOUNTER — Encounter: Payer: Self-pay | Admitting: Podiatry

## 2019-06-05 DIAGNOSIS — M86672 Other chronic osteomyelitis, left ankle and foot: Secondary | ICD-10-CM

## 2019-06-05 DIAGNOSIS — M2042 Other hammer toe(s) (acquired), left foot: Secondary | ICD-10-CM

## 2019-06-05 DIAGNOSIS — M21542 Acquired clubfoot, left foot: Secondary | ICD-10-CM

## 2019-06-10 ENCOUNTER — Encounter: Payer: Self-pay | Admitting: Podiatry

## 2019-06-10 ENCOUNTER — Other Ambulatory Visit: Payer: Self-pay

## 2019-06-10 ENCOUNTER — Ambulatory Visit (INDEPENDENT_AMBULATORY_CARE_PROVIDER_SITE_OTHER): Payer: BC Managed Care – PPO

## 2019-06-10 ENCOUNTER — Ambulatory Visit (INDEPENDENT_AMBULATORY_CARE_PROVIDER_SITE_OTHER): Payer: BC Managed Care – PPO | Admitting: Podiatry

## 2019-06-10 VITALS — BP 134/74 | HR 74 | Temp 97.9°F

## 2019-06-10 DIAGNOSIS — M6240 Contracture of muscle, unspecified site: Secondary | ICD-10-CM | POA: Diagnosis not present

## 2019-06-10 DIAGNOSIS — M2042 Other hammer toe(s) (acquired), left foot: Secondary | ICD-10-CM

## 2019-06-10 DIAGNOSIS — Z9889 Other specified postprocedural states: Secondary | ICD-10-CM

## 2019-06-10 NOTE — Progress Notes (Signed)
He presents today date of surgery 06/05/2019 with excision of second toe and second metatarsal head.  He states that he has had absolutely no pain whatsoever from the date of surgery.  States that he can believe that it has not been painful.  Denies fever chills nausea vomiting muscle aches pains calf pain back pain chest pain shortness of breath.  Objective: Vital signs are stable alert and oriented x3.  Pulses are palpable.  Sutures are intact overlying the surgical site amputation second metatarsophalangeal joint.  No erythema edema cellulitis drainage or odor no purulence no malodor radiographs taken today demonstrate complete resection.  Assessment: Amputation of the second toe and second metatarsal head.  Plan: Number to follow-up with him in 1 week and went ahead and redressed him today dressed a compressive dressing encouraged him to limit his ambulation and from this to not be wet.

## 2019-06-17 ENCOUNTER — Encounter: Payer: BC Managed Care – PPO | Admitting: Podiatry

## 2019-06-17 ENCOUNTER — Telehealth: Payer: Self-pay | Admitting: *Deleted

## 2019-06-17 NOTE — Telephone Encounter (Signed)
The patient sent pictures and I sent the pictures to Dr Stephenie Acres assistant and per Dr Milinda Pointer, everything seems to be okay and just needs to keep the area covered and patient is to see Dr Milinda Pointer on Monday the 15th. Joshua Fowler

## 2019-06-17 NOTE — Telephone Encounter (Signed)
Patient called and stated that he is in [place] for an emergency and got the surgery foot wet in the shower and the bag was full of water and did dry it off real good and re wrapped with gauze and used mupirocin ointment and patient stated that it does look a little different than it did before it got wet and I stated to send me pictures to my cell phone and I would send to Dr Stephenie Acres nurse and to call the office if any concerns or questions. Joshua Fowler

## 2019-06-22 ENCOUNTER — Ambulatory Visit (INDEPENDENT_AMBULATORY_CARE_PROVIDER_SITE_OTHER): Payer: BC Managed Care – PPO | Admitting: Podiatry

## 2019-06-22 ENCOUNTER — Encounter: Payer: Self-pay | Admitting: Podiatry

## 2019-06-22 ENCOUNTER — Other Ambulatory Visit: Payer: Self-pay

## 2019-06-22 DIAGNOSIS — M6240 Contracture of muscle, unspecified site: Secondary | ICD-10-CM

## 2019-06-22 DIAGNOSIS — M2042 Other hammer toe(s) (acquired), left foot: Secondary | ICD-10-CM

## 2019-06-22 DIAGNOSIS — L603 Nail dystrophy: Secondary | ICD-10-CM | POA: Diagnosis not present

## 2019-06-22 DIAGNOSIS — Z9889 Other specified postprocedural states: Secondary | ICD-10-CM

## 2019-06-22 NOTE — Progress Notes (Signed)
He presents today postop visit 06/05/2019 status post partial mastectomy or met head resection second metatarsal right with resection of the second toe at the level of the metatarsophalangeal joint.  States that he got his foot wet and redressed the foot.  Objective: Vital signs are stable he is alert and oriented times 3 sutures are intact margins appear to be well coapted remove the sutures today.  Margins remain well coapted.  Assessment: Well-healing surgical foot.  Plan: He is very happy with the outcome redressed him today for another week and then we will make sure that he is able to travel by this time next week.  He should be healed 100% enough to get into his shoe.  I think shoe gear will be fine this time next week provided his incision is healed.  Otherwise Epson salts and warm water soaks and local wound care will be necessary.

## 2019-06-24 MED ORDER — ONDANSETRON HCL 4 MG TABLET
ORAL_TABLET | 0 refills | 0 days | Status: CP
Start: 2019-06-24 — End: ?

## 2019-06-25 DIAGNOSIS — F1199 Opioid use, unspecified with unspecified opioid-induced disorder: Principal | ICD-10-CM

## 2019-06-26 ENCOUNTER — Telehealth: Admit: 2019-06-26 | Discharge: 2019-06-26 | Payer: PRIVATE HEALTH INSURANCE

## 2019-06-26 ENCOUNTER — Institutional Professional Consult (permissible substitution): Admit: 2019-06-26 | Discharge: 2019-06-26 | Payer: PRIVATE HEALTH INSURANCE

## 2019-06-26 DIAGNOSIS — F1199 Opioid use, unspecified with unspecified opioid-induced disorder: Principal | ICD-10-CM

## 2019-06-26 DIAGNOSIS — G4700 Insomnia, unspecified: Principal | ICD-10-CM

## 2019-06-30 ENCOUNTER — Other Ambulatory Visit: Payer: Self-pay

## 2019-06-30 ENCOUNTER — Ambulatory Visit (INDEPENDENT_AMBULATORY_CARE_PROVIDER_SITE_OTHER): Payer: BC Managed Care – PPO | Admitting: Podiatry

## 2019-06-30 ENCOUNTER — Encounter: Payer: Self-pay | Admitting: Podiatry

## 2019-06-30 DIAGNOSIS — Z9889 Other specified postprocedural states: Secondary | ICD-10-CM

## 2019-06-30 DIAGNOSIS — M2042 Other hammer toe(s) (acquired), left foot: Secondary | ICD-10-CM

## 2019-06-30 MED ORDER — GENTAMICIN SULFATE 0.1 % EX CREA: 1.0000 "application " | TOPICAL_CREAM | Freq: Two times a day (BID) | CUTANEOUS | 1 refills | Status: DC

## 2019-07-01 ENCOUNTER — Encounter: Payer: BC Managed Care – PPO | Admitting: Podiatry

## 2019-07-06 NOTE — Progress Notes (Signed)
   Subjective:  Patient presents today status post 2nd toe amputation left. DOS: 06/05/2019. He states he is doing well. He denies any significant pain or modifying factors. He has been wearing good shoe gear as directed. Patient is here for further evaluation and treatment.    Past Medical History:  Diagnosis Date  . A-fib ([place]) 1995  . CAD (coronary artery disease)   . Diabetes mellitus without complication ([place])   . GERD (gastroesophageal reflux disease)   . Hyperlipidemia   . Hypertension   . Sleep apnea    cpap  . Type 2 diabetes mellitus without complication ([facility])       Objective/Physical Exam Neurovascular status intact. Small area of dehiscence to the distal amputation site measuring 0.5 cm in length. Moderate edema noted to the surgical extremity.  Assessment: 1. s/p 2nd toe amputation left. DOS: 06/05/2019   Plan of Care:  1. Patient was evaluated.  2. Continue wearing good shoe gear.  3. Prescription for Gentamicin cream provided to patient to use daily with a bandage.  4. Return to clinic in 2 weeks for follow up.   Going to [place] for family vacation tomorrow morning.    Edrick Kins, DPM [facility]  Dr. Edrick Kins, [address]                                        [place],  [address]                Office [phone]  Fax [phone]

## 2019-07-07 DIAGNOSIS — F1199 Opioid use, unspecified with unspecified opioid-induced disorder: Principal | ICD-10-CM

## 2019-07-07 MED ORDER — BUPRENORPHINE HCL 8 MG SUBLINGUAL TABLET
ORAL_TABLET | 1 refills | 0 days | Status: CP
Start: 2019-07-07 — End: 2019-09-05

## 2019-07-15 ENCOUNTER — Encounter: Payer: BC Managed Care – PPO | Admitting: Podiatry

## 2019-07-30 ENCOUNTER — Encounter: Payer: BC Managed Care – PPO | Admitting: Podiatry

## 2019-08-10 NOTE — Progress Notes (Signed)
This encounter was created in error - please disregard.

## 2019-08-28 ENCOUNTER — Other Ambulatory Visit: Payer: Self-pay | Admitting: Family Medicine

## 2019-08-28 NOTE — Telephone Encounter (Signed)
Has not been seen in a year. Looks like he's seeing [facility]. Please check to see if he's still seeing [place] and if he is we need to see him

## 2019-08-28 NOTE — Telephone Encounter (Signed)
Call to patient to schedule Annual exam- he was unaware of PCP retirement. Patient request to see Dr Wynetta Emery- advised she would have to review request before I could schedule. Patient would also like refill on ibuprofen as well. Patient to expect call back with response.

## 2019-08-28 NOTE — Telephone Encounter (Signed)
See message below °

## 2019-08-31 ENCOUNTER — Other Ambulatory Visit: Payer: Self-pay | Admitting: Specialist

## 2019-08-31 NOTE — Telephone Encounter (Signed)
Appt scheduled for tomorrow.  °

## 2019-08-31 NOTE — Telephone Encounter (Signed)
Routing to appropriate provider

## 2019-08-31 NOTE — Telephone Encounter (Signed)
Patient has been out for a couple of weeks, and has already taken his medication for today, so he said it will be fine to wait until tomorrow to have it filled.

## 2019-08-31 NOTE — Telephone Encounter (Signed)
Does he have enough to make it until tomorrow?

## 2019-09-01 ENCOUNTER — Encounter: Payer: Self-pay | Admitting: Family Medicine

## 2019-09-01 ENCOUNTER — Other Ambulatory Visit: Payer: Self-pay

## 2019-09-01 ENCOUNTER — Ambulatory Visit (INDEPENDENT_AMBULATORY_CARE_PROVIDER_SITE_OTHER): Payer: PPO | Admitting: Family Medicine

## 2019-09-01 VITALS — BP 137/73 | HR 78 | Temp 98.3°F | Ht 68.0 in | Wt 238.4 lb

## 2019-09-01 DIAGNOSIS — F320 Major depressive disorder, single episode, mild: Secondary | ICD-10-CM | POA: Diagnosis not present

## 2019-09-01 DIAGNOSIS — I2510 Atherosclerotic heart disease of native coronary artery without angina pectoris: Secondary | ICD-10-CM

## 2019-09-01 DIAGNOSIS — I10 Essential (primary) hypertension: Secondary | ICD-10-CM | POA: Diagnosis not present

## 2019-09-01 DIAGNOSIS — E662 Morbid (severe) obesity with alveolar hypoventilation: Secondary | ICD-10-CM

## 2019-09-01 DIAGNOSIS — E291 Testicular hypofunction: Secondary | ICD-10-CM

## 2019-09-01 DIAGNOSIS — E7800 Pure hypercholesterolemia, unspecified: Secondary | ICD-10-CM | POA: Diagnosis not present

## 2019-09-01 DIAGNOSIS — E113553 Type 2 diabetes mellitus with stable proliferative diabetic retinopathy, bilateral: Secondary | ICD-10-CM

## 2019-09-01 DIAGNOSIS — Z794 Long term (current) use of insulin: Secondary | ICD-10-CM | POA: Diagnosis not present

## 2019-09-01 LAB — UA/M W/RFLX CULTURE, ROUTINE
Bilirubin, UA: NEGATIVE
Ketones, UA: NEGATIVE
Leukocytes,UA: NEGATIVE
Nitrite, UA: NEGATIVE
Protein,UA: NEGATIVE
RBC, UA: NEGATIVE
Specific Gravity, UA: 1.01 (ref 1.005–1.030)
Urobilinogen, Ur: 1 mg/dL (ref 0.2–1.0)
pH, UA: 5 (ref 5.0–7.5)

## 2019-09-01 LAB — MICROALBUMIN, URINE WAIVED
Creatinine, Urine Waived: 50 mg/dL (ref 10–300)
Microalb, Ur Waived: 10 mg/L (ref 0–19)

## 2019-09-01 LAB — BAYER DCA HB A1C WAIVED: HB A1C (BAYER DCA - WAIVED): 6.3 % (ref <7.0)

## 2019-09-01 MED ORDER — GABAPENTIN 300 MG PO CAPS: 300.0000 mg | ORAL_CAPSULE | Freq: Three times a day (TID) | ORAL | 1 refills | Status: DC

## 2019-09-01 MED ORDER — IBUPROFEN 800 MG PO TABS: 800.0000 mg | ORAL_TABLET | Freq: Three times a day (TID) | ORAL | 1 refills | Status: DC

## 2019-09-01 MED ORDER — PANTOPRAZOLE SODIUM 40 MG PO TBEC: 40.0000 mg | DELAYED_RELEASE_TABLET | Freq: Every day | ORAL | 1 refills | Status: DC

## 2019-09-01 MED ORDER — VENLAFAXINE HCL ER 37.5 MG PO CP24: 37.5000 mg | ORAL_CAPSULE | Freq: Every day | ORAL | 1 refills | Status: AC

## 2019-09-01 MED ORDER — EZETIMIBE 10 MG PO TABS: 10.0000 mg | ORAL_TABLET | Freq: Every day | ORAL | 1 refills | Status: DC

## 2019-09-01 MED ORDER — ONDANSETRON HCL 4 MG PO TABS: 4.0000 mg | ORAL_TABLET | Freq: Two times a day (BID) | ORAL | 1 refills | Status: DC

## 2019-09-01 NOTE — Assessment & Plan Note (Signed)
Under good control on current regimen. Continue current regimen. Continue to monitor. Call with any concerns. Refills given. Labs drawn today.   

## 2019-09-01 NOTE — Progress Notes (Signed)
BP 137/73 (BP Location: Left Arm, Patient Position: Sitting, Cuff Size: Normal)   Pulse 78   Temp 98.3 F (36.8 C) (Oral)   Ht 5\' 8"  (1.727 m)   Wt 238 lb 6.4 oz (108.1 kg)   SpO2 96%   BMI 36.25 kg/m    Subjective:    Patient ID: Joshua Route Sr., male    DOB: 06-20-1955, 64 y.o.   MRN: [id number]  HPI: Joshua BOUKNIGHT Sr. is a 64 y.o. male  Chief Complaint  Patient presents with  . Hypertension  . Hyperlipidemia  . Diabetes   DIABETES- has been following with endocrinology Hypoglycemic episodes:no Polydipsia/polyuria: no Visual disturbance: no Chest pain: no Paresthesias: no Glucose Monitoring: yes Taking Insulin?: yes Blood Pressure Monitoring: not checking Retinal Examination: Up to Date Foot Exam: Up to Date Diabetic Education: Completed Pneumovax: Up to Date Influenza: Up to Date Aspirin: yes  HYPERTENSION / HYPERLIPIDEMIA Satisfied with current treatment? yes Duration of hypertension: chronic BP monitoring frequency: not checking BP medication side effects: no Duration of hyperlipidemia: chronic Cholesterol medication side effects: no Cholesterol supplements: fish oil Past cholesterol medications: lovaza, zetia Medication compliance: excellent compliance Aspirin: yes Recent stressors: yes Recurrent headaches: no Visual changes: no Palpitations: no Dyspnea: yes Chest pain: no Lower extremity edema: no Dizzy/lightheaded: no  LOW TESTOSTERONE- has been following with endocrinology Duration: chronic Status: stable  Satisfied with current treatment:  yes Previous testosterone therapies: Medication side effects:  no Medication compliance: excellent compliance Decreased libido: no Fatigue: no Depressed mood: no Muscle weakness: no Erectile dysfunction: yes  GERD GERD control status: controlled  Satisfied with current treatment? yes Medication side effects: no  Medication compliance: excellent Dysphagia: no Odynophagia:   no Hematemesis: no Blood in stool: no EGD: no  DEPRESSION Mood status: controlled Satisfied with current treatment?: yes Symptom severity: mild  Duration of current treatment : chronic Side effects: no Medication compliance: excellent compliance Psychotherapy/counseling: no  Previous psychiatric medications: effexor Depressed mood: no Anxious mood: no Anhedonia: no Significant weight loss or gain: no Insomnia: no  Fatigue: no Feelings of worthlessness or guilt: no Impaired concentration/indecisiveness: no Suicidal ideations: no Hopelessness: no Crying spells: no Depression screen [facility] 2/9 09/01/2019 08/06/2018 02/03/2018 11/12/2016  Decreased Interest 0 0 0 0  Down, Depressed, Hopeless 0 0 0 0  PHQ - 2 Score 0 0 0 0  Altered sleeping - - 0 -  Tired, decreased energy - - 0 -  Change in appetite - - 0 -  Feeling bad or failure about yourself  - - 0 -  Trouble concentrating - - 0 -  Moving slowly or fidgety/restless - - 0 -  Suicidal thoughts - - 0 -  PHQ-9 Score - - 0 -    Relevant past medical, surgical, family and social history reviewed and updated as indicated. Interim medical history since our last visit reviewed. Allergies and medications reviewed and updated.  Review of Systems  Constitutional: Negative.   Respiratory: Negative.   Cardiovascular: Negative.   Gastrointestinal: Negative.   Musculoskeletal: Positive for arthralgias, back pain and myalgias. Negative for gait problem, joint swelling, neck pain and neck stiffness.  Skin: Negative.   Neurological: Negative.   Psychiatric/Behavioral: Negative.     Per HPI unless specifically indicated above     Objective:    BP 137/73 (BP Location: Left Arm, Patient Position: Sitting, Cuff Size: Normal)   Pulse 78   Temp 98.3 F (36.8 C) (Oral)   Ht 5\' 8"  (1.727  m)   Wt 238 lb 6.4 oz (108.1 kg)   SpO2 96%   BMI 36.25 kg/m   Wt Readings from Last 3 Encounters:  09/01/19 238 lb 6.4 oz (108.1 kg)  08/06/18 242  lb (109.8 kg)  02/03/18 249 lb (112.9 kg)    Physical Exam Vitals and nursing note reviewed.  Constitutional:      General: He is not in acute distress.    Appearance: Normal appearance. He is not ill-appearing, toxic-appearing or diaphoretic.  HENT:     Head: Normocephalic and atraumatic.     Right Ear: External ear normal.     Left Ear: External ear normal.     Nose: Nose normal.     Mouth/Throat:     Mouth: Mucous membranes are moist.     Pharynx: Oropharynx is clear.  Eyes:     General: No scleral icterus.       Right eye: No discharge.        Left eye: No discharge.     Extraocular Movements: Extraocular movements intact.     Conjunctiva/sclera: Conjunctivae normal.     Pupils: Pupils are equal, round, and reactive to light.  Cardiovascular:     Rate and Rhythm: Normal rate and regular rhythm.     Pulses: Normal pulses.     Heart sounds: Normal heart sounds. No murmur. No friction rub. No gallop.   Pulmonary:     Effort: Pulmonary effort is normal. No respiratory distress.     Breath sounds: Normal breath sounds. No stridor. No wheezing, rhonchi or rales.  Chest:     Chest wall: No tenderness.  Musculoskeletal:        General: Normal range of motion.     Cervical back: Normal range of motion and neck supple.  Skin:    General: Skin is warm and dry.     Capillary Refill: Capillary refill takes less than 2 seconds.     Coloration: Skin is not jaundiced or pale.     Findings: No bruising, erythema, lesion or rash.  Neurological:     General: No focal deficit present.     Mental Status: He is alert and oriented to person, place, and time. Mental status is at baseline.  Psychiatric:        Mood and Affect: Mood normal.        Behavior: Behavior normal.        Thought Content: Thought content normal.        Judgment: Judgment normal.     Results for orders placed or performed in visit on 10/14/18  HM DIABETES EYE EXAM  Result Value Ref Range   HM Diabetic Eye Exam  Retinopathy (A) No Retinopathy      Assessment & Plan:   Problem List Items Addressed This Visit      Cardiovascular and Mediastinum   Essential hypertension - Primary    Under good control on current regimen. Continue current regimen. Continue to monitor. Call with any concerns. Refills given by cardiology. Will send them a copy of labs done today. Await results.        Relevant Medications   ezetimibe (ZETIA) 10 MG tablet   Other Relevant Orders   CBC with Differential/Platelet   Comprehensive metabolic panel   Microalbumin, Urine Waived   TSH   CAD (coronary artery disease)    Conitnue to follow with cardiology. Will keep BP, cholesterol and sugars under good control. Continue to monitor. Call with any concerns.  Relevant Medications   ezetimibe (ZETIA) 10 MG tablet     Endocrine   Hypogonadism in male    Managed by endocrine. Continue to monitor. Call with any concerns. Checking labs this morning and will send to endocrine.       Relevant Orders   CBC with Differential/Platelet   Comprehensive metabolic panel   PSA   Testosterone   Diabetes mellitus type 2 with retinopathy ([place])    Follows with endocrinology. Will check labs today and send them to endocrine. Continue to monitor. Call with any concerns. Refills per endocrine.       Relevant Orders   Bayer DCA Hb A1c Waived   CBC with Differential/Platelet   Comprehensive metabolic panel   Microalbumin, Urine Waived   UA/M w/rflx Culture, Routine     Other   Hyperlipidemia    Under good control on current regimen. Continue current regimen. Continue to monitor. Call with any concerns. Refills given. Labs drawn today.       Relevant Medications   ezetimibe (ZETIA) 10 MG tablet   Other Relevant Orders   CBC with Differential/Platelet   Comprehensive metabolic panel   Lipid Panel w/o Chol/HDL Ratio   Long-term insulin use (HCC)    Following with endocrinology. Await results. Call with any concerns.         Morbid obesity with alveolar hypoventilation ([facility])    Due to DM, HTN and HLD. Encouraged diet and exercise. Goal of losing 1-2lbs per week. Call with any concerns.       Depression, major, single episode, mild ([place])    Doing well on his effexor. Continue current regimen. Continue to monitor. Call with any concerns. Refills given today.      Relevant Medications   venlafaxine XR (EFFEXOR-XR) 37.5 MG 24 hr capsule       Follow up plan: Return in about 6 months (around 03/02/2020) for Physical.

## 2019-09-01 NOTE — Assessment & Plan Note (Signed)
Due to DM, HTN and HLD. Encouraged diet and exercise. Goal of losing 1-2lbs per week. Call with any concerns.

## 2019-09-01 NOTE — Assessment & Plan Note (Signed)
Under good control on current regimen. Continue current regimen. Continue to monitor. Call with any concerns. Refills given by cardiology. Will send them a copy of labs done today. Await results.

## 2019-09-01 NOTE — Assessment & Plan Note (Signed)
Managed by endocrine. Continue to monitor. Call with any concerns. Checking labs this morning and will send to endocrine.

## 2019-09-01 NOTE — Assessment & Plan Note (Signed)
Conitnue to follow with cardiology. Will keep BP, cholesterol and sugars under good control. Continue to monitor. Call with any concerns.

## 2019-09-01 NOTE — Assessment & Plan Note (Signed)
Follows with endocrinology. Will check labs today and send them to endocrine. Continue to monitor. Call with any concerns. Refills per endocrine.

## 2019-09-01 NOTE — Assessment & Plan Note (Signed)
Doing well on his effexor. Continue current regimen. Continue to monitor. Call with any concerns. Refills given today.

## 2019-09-01 NOTE — Assessment & Plan Note (Signed)
Following with endocrinology. Await results. Call with any concerns.

## 2019-09-02 LAB — CBC WITH DIFFERENTIAL/PLATELET
Basophils Absolute: 0.1 10*3/uL (ref 0.0–0.2)
Basos: 1 %
EOS (ABSOLUTE): 0.3 10*3/uL (ref 0.0–0.4)
Eos: 3 %
Hematocrit: 49 % (ref 37.5–51.0)
Hemoglobin: 16.9 g/dL (ref 13.0–17.7)
Immature Grans (Abs): 0 10*3/uL (ref 0.0–0.1)
Immature Granulocytes: 0 %
Lymphocytes Absolute: 1.9 10*3/uL (ref 0.7–3.1)
Lymphs: 25 %
MCH: 31.7 pg (ref 26.6–33.0)
MCHC: 34.5 g/dL (ref 31.5–35.7)
MCV: 92 fL (ref 79–97)
Monocytes Absolute: 0.6 10*3/uL (ref 0.1–0.9)
Monocytes: 8 %
Neutrophils Absolute: 4.6 10*3/uL (ref 1.4–7.0)
Neutrophils: 63 %
Platelets: 278 10*3/uL (ref 150–450)
RBC: 5.33 x10E6/uL (ref 4.14–5.80)
RDW: 12.3 % (ref 11.6–15.4)
WBC: 7.4 10*3/uL (ref 3.4–10.8)

## 2019-09-02 LAB — COMPREHENSIVE METABOLIC PANEL
ALT: 31 IU/L (ref 0–44)
AST: 26 IU/L (ref 0–40)
Albumin/Globulin Ratio: 1.6 (ref 1.2–2.2)
Albumin: 4.4 g/dL (ref 3.8–4.8)
Alkaline Phosphatase: 94 IU/L (ref 39–117)
BUN/Creatinine Ratio: 23 (ref 10–24)
BUN: 23 mg/dL (ref 8–27)
Bilirubin Total: 0.5 mg/dL (ref 0.0–1.2)
CO2: 23 mmol/L (ref 20–29)
Calcium: 9.5 mg/dL (ref 8.6–10.2)
Chloride: 98 mmol/L (ref 96–106)
Creatinine, Ser: 1 mg/dL (ref 0.76–1.27)
GFR calc Af Amer: 91 mL/min/{1.73_m2} (ref >59)
GFR calc non Af Amer: 79 mL/min/{1.73_m2} (ref >59)
Globulin, Total: 2.7 g/dL (ref 1.5–4.5)
Glucose: 104 mg/dL — ABNORMAL HIGH (ref 65–99)
Potassium: 4.3 mmol/L (ref 3.5–5.2)
Sodium: 139 mmol/L (ref 134–144)
Total Protein: 7.1 g/dL (ref 6.0–8.5)

## 2019-09-02 LAB — LIPID PANEL W/O CHOL/HDL RATIO
Cholesterol, Total: 142 mg/dL (ref 100–199)
HDL: 36 mg/dL — ABNORMAL LOW (ref >39)
LDL Chol Calc (NIH): 85 mg/dL (ref 0–99)
Triglycerides: 117 mg/dL (ref 0–149)
VLDL Cholesterol Cal: 21 mg/dL (ref 5–40)

## 2019-09-02 LAB — TSH: TSH: 1.23 u[IU]/mL (ref 0.450–4.500)

## 2019-09-02 LAB — TESTOSTERONE: Testosterone: >1500 ng/dL — ABNORMAL HIGH (ref 264–916)

## 2019-09-02 LAB — PSA: Prostate Specific Ag, Serum: 0.4 ng/mL (ref 0.0–4.0)

## 2019-09-05 MED ORDER — BUPRENORPHINE HCL 8 MG SUBLINGUAL TABLET
ORAL_TABLET | 0 refills | 12.00000 days | Status: CP
Start: 2019-09-05 — End: 2019-09-17

## 2019-09-14 ENCOUNTER — Other Ambulatory Visit: Payer: Self-pay

## 2019-09-14 ENCOUNTER — Other Ambulatory Visit
Admission: RE | Admit: 2019-09-14 | Discharge: 2019-09-14 | Disposition: A | Discharge status: Home / Self Care | Payer: PPO | Source: Ambulatory Visit | Attending: Specialist | Admitting: Specialist

## 2019-09-14 DIAGNOSIS — Z01818 Encounter for other preprocedural examination: Secondary | ICD-10-CM | POA: Insufficient documentation

## 2019-09-14 HISTORY — DX: Personal history of urinary calculi: Z87.442

## 2019-09-14 NOTE — Patient Instructions (Addendum)
Your procedure is scheduled on: Monday 09/21/19.  Report to DAY SURGERY DEPARTMENT LOCATED ON 2ND FLOOR MEDICAL MALL ENTRANCE. To find out your arrival time please call [phone] between 1PM - 3PM on Friday 09/18/19.   Remember: Instructions that are not followed completely may result in serious medical risk, up to and including death, or upon the discretion of your surgeon and anesthesiologist your surgery may need to be rescheduled.      _X__ 1. Do not eat food after midnight the night before your procedure.                 No gum chewing or hard candies. You may drink WATER up to 2 hours                 before you are scheduled to arrive for your surgery- DO NOT drink WATER                 within 2 hours of the start of your surgery.                   __X__2.  On the morning of surgery brush your teeth with toothpaste and water, you may rinse your mouth with mouthwash if you wish.  Do not swallow any toothpaste or mouthwash.       _X__ 3.  No Alcohol for 24 hours before or after surgery.     _X__ 4.  Do Not Smoke or use e-cigarettes For 24 Hours Prior to Your Surgery.                 Do not use any chewable tobacco products for at least 6 hours prior to                 Surgery.    __X__5.  Notify your doctor if there is any change in your medical condition      (cold, fever, infections).       Do not wear jewelry, make-up, hairpins, clips or nail polish. Do not wear lotions, powders, or perfumes.  Do not shave 48 hours prior to surgery. Men may shave face and neck. Do not bring valuables to the hospital.     [facility] is not responsible for any belongings or valuables.   Contacts, dentures/partials or body piercings may not be worn into surgery. Bring a case for your contacts, glasses or hearing aids, a denture cup will be supplied.    Patients discharged the day of surgery will not be allowed to drive home.    __X__ Take these medicines the morning of surgery  with A SIP OF WATER:     1. amLODipine (NORVASC)   2. buprenorphine (SUBUTEX)  3. pantoprazole (PROTONIX)  4. venlafaxine XR (EFFEXOR-XR)  5. timolol (TIMOPTIC)  6. diazepam (VALIUM)  IF NEEDED   __X__ Use CHG Soap as directed   __X__ Stop Metformin 2 days prior to surgery. Your last dose will be on Friday night 09/18/19.    __X__ Take 1/2 of usual insulin dose the night before surgery. No insulin the morning            of surgery.    __X__ Stop Blood Thinners: Aspirin 5 prior to your procedure. Your last dose will be on Wednesday 09/16/19.   __X__ Stop Vascepa TODAY. Monday 09/14/19.   __X__ Stop Anti-inflammatories 7 days before surgery such as Advil, Ibuprofen, Motrin, BC or Goodies Powder, Naprosyn, Naproxen, Aleve, Aspirin, Meloxicam. May take  Tylenol if needed for pain or discomfort.    __X__ Don't start taking any new herbal supplements or vitamins prior to your procedure.

## 2019-09-17 ENCOUNTER — Ambulatory Visit: Admit: 2019-09-17 | Discharge: 2019-09-18 | Payer: PRIVATE HEALTH INSURANCE

## 2019-09-17 ENCOUNTER — Other Ambulatory Visit
Admission: RE | Admit: 2019-09-17 | Discharge: 2019-09-17 | Disposition: A | Discharge status: Home / Self Care | Payer: PPO | Source: Ambulatory Visit | Attending: Specialist | Admitting: Specialist

## 2019-09-17 DIAGNOSIS — Z79899 Other long term (current) drug therapy: Principal | ICD-10-CM

## 2019-09-17 DIAGNOSIS — Z23 Encounter for immunization: Principal | ICD-10-CM

## 2019-09-17 DIAGNOSIS — F1199 Opioid use, unspecified with unspecified opioid-induced disorder: Principal | ICD-10-CM

## 2019-09-17 DIAGNOSIS — R110 Nausea: Principal | ICD-10-CM

## 2019-09-17 DIAGNOSIS — F411 Generalized anxiety disorder: Principal | ICD-10-CM

## 2019-09-17 DIAGNOSIS — I10 Essential (primary) hypertension: Principal | ICD-10-CM

## 2019-09-17 DIAGNOSIS — Z6835 Body mass index (BMI) 35.0-35.9, adult: Secondary | ICD-10-CM | POA: Diagnosis not present

## 2019-09-17 MED ORDER — BUPRENORPHINE HCL 8 MG SUBLINGUAL TABLET
ORAL_TABLET | 1 refills | 0 days | Status: CP
Start: 2019-09-17 — End: 2019-11-17

## 2019-09-17 NOTE — Pre-Procedure Instructions (Signed)
Spoke with patient regarding his missed Covid appointment.  He states that he called the office yesterday and told them that he would have to reschedule until later in the year.  He is waiting for a return call from the office since he has not had luck getting through their phones.  I will send a note to Dr. Sabra Heck with this info.

## 2019-09-21 ENCOUNTER — Ambulatory Visit: Admission: RE | Admit: 2019-09-21 | Payer: PPO | Source: Home / Self Care | Admitting: Specialist

## 2019-09-21 ENCOUNTER — Encounter: Admission: RE | Payer: Self-pay | Source: Home / Self Care

## 2019-09-21 SURGERY — CARPAL TUNNEL RELEASE
Anesthesia: General | Laterality: Left

## 2019-09-28 DIAGNOSIS — Z794 Long term (current) use of insulin: Secondary | ICD-10-CM | POA: Diagnosis not present

## 2019-09-28 DIAGNOSIS — E113493 Type 2 diabetes mellitus with severe nonproliferative diabetic retinopathy without macular edema, bilateral: Secondary | ICD-10-CM | POA: Diagnosis not present

## 2019-09-28 DIAGNOSIS — I10 Essential (primary) hypertension: Secondary | ICD-10-CM | POA: Diagnosis not present

## 2019-09-28 DIAGNOSIS — E221 Hyperprolactinemia: Secondary | ICD-10-CM | POA: Diagnosis not present

## 2019-09-28 DIAGNOSIS — E291 Testicular hypofunction: Secondary | ICD-10-CM | POA: Diagnosis not present

## 2019-09-28 DIAGNOSIS — E782 Mixed hyperlipidemia: Secondary | ICD-10-CM | POA: Diagnosis not present

## 2019-09-28 DIAGNOSIS — Z5181 Encounter for therapeutic drug level monitoring: Secondary | ICD-10-CM | POA: Diagnosis not present

## 2019-10-12 ENCOUNTER — Other Ambulatory Visit: Payer: Self-pay

## 2019-10-12 ENCOUNTER — Ambulatory Visit (INDEPENDENT_AMBULATORY_CARE_PROVIDER_SITE_OTHER): Payer: PPO

## 2019-10-12 DIAGNOSIS — E113411 Type 2 diabetes mellitus with severe nonproliferative diabetic retinopathy with macular edema, right eye: Secondary | ICD-10-CM | POA: Diagnosis not present

## 2019-10-12 DIAGNOSIS — L570 Actinic keratosis: Secondary | ICD-10-CM

## 2019-10-12 NOTE — Patient Instructions (Signed)

## 2019-10-12 NOTE — Progress Notes (Signed)
Photodynamic Therapy Procedure Note Diagnosis: Actinic keratosis  Location: scalp Informed Consent: Discussed risks (burning, pain, redness, peeling, severe sunburn-like reaction, blistering, discoloration, lack of resolution) and  benefits of the procedure, as well as the alternatives. Informed consent was obtained. Preparation: After cleansing the skin, the area to be treated was coated with Levulan.  This was allowed to sit on the skin for 2 hours. Procedure Details: The patient was placed under the light source with appropriate eye protection for eyes minutes. After completing the treatment, the patient applied sunscreen to the treated areas. Patient tolerated the procedure well Plan: Avoid any sun exposure for the next 24 hours. Wear sunscreen daily for the next week. Observe normal sun precautions thereafter. Recommend OTC analgesia as needed for pain. Follow-up in 2 months.  I, Marye Round, CMA, am acting as scribe for [employer], [place] .

## 2019-10-14 MED ORDER — AMINOLEVULINIC ACID HCL 20 % EX SOLR
1.0000 "application " | Freq: Once | CUTANEOUS | Status: AC
  Administered 2019-10-12: 354 mg via TOPICAL

## 2019-10-19 DIAGNOSIS — H4052X2 Glaucoma secondary to other eye disorders, left eye, moderate stage: Secondary | ICD-10-CM | POA: Diagnosis not present

## 2019-11-03 DIAGNOSIS — G4733 Obstructive sleep apnea (adult) (pediatric): Secondary | ICD-10-CM | POA: Diagnosis not present

## 2019-11-18 ENCOUNTER — Ambulatory Visit: Admit: 2019-11-18 | Payer: PRIVATE HEALTH INSURANCE

## 2019-11-26 DIAGNOSIS — F1199 Opioid use, unspecified with unspecified opioid-induced disorder: Principal | ICD-10-CM

## 2019-11-26 DIAGNOSIS — Z79899 Other long term (current) drug therapy: Principal | ICD-10-CM

## 2019-11-26 MED ORDER — BUPRENORPHINE HCL 8 MG SUBLINGUAL TABLET
ORAL_TABLET | 0 refills | 7.00000 days | Status: CP
Start: 2019-11-26 — End: 2019-12-03

## 2019-12-01 ENCOUNTER — Institutional Professional Consult (permissible substitution): Admit: 2019-12-01 | Discharge: 2019-12-02 | Payer: PRIVATE HEALTH INSURANCE

## 2019-12-01 DIAGNOSIS — Z79899 Other long term (current) drug therapy: Principal | ICD-10-CM

## 2019-12-01 DIAGNOSIS — F1199 Opioid use, unspecified with unspecified opioid-induced disorder: Principal | ICD-10-CM

## 2019-12-07 DIAGNOSIS — F1199 Opioid use, unspecified with unspecified opioid-induced disorder: Principal | ICD-10-CM

## 2019-12-07 MED ORDER — BUPRENORPHINE HCL 8 MG SUBLINGUAL TABLET
ORAL_TABLET | 0 refills | 17.00000 days | Status: CP
Start: 2019-12-07 — End: 2019-12-24

## 2019-12-11 ENCOUNTER — Other Ambulatory Visit: Payer: Self-pay | Admitting: Family Medicine

## 2019-12-11 DIAGNOSIS — E7800 Pure hypercholesterolemia, unspecified: Secondary | ICD-10-CM

## 2019-12-23 ENCOUNTER — Ambulatory Visit: Admit: 2019-12-23 | Discharge: 2019-12-24 | Payer: PRIVATE HEALTH INSURANCE

## 2019-12-23 DIAGNOSIS — F1111 Opioid abuse, in remission: Principal | ICD-10-CM

## 2019-12-23 DIAGNOSIS — R519 Nonintractable headache, unspecified chronicity pattern, unspecified headache type: Principal | ICD-10-CM

## 2019-12-23 DIAGNOSIS — R110 Nausea: Principal | ICD-10-CM

## 2019-12-23 DIAGNOSIS — F1199 Opioid use, unspecified with unspecified opioid-induced disorder: Principal | ICD-10-CM

## 2019-12-23 DIAGNOSIS — M545 Low back pain: Principal | ICD-10-CM

## 2019-12-23 DIAGNOSIS — F411 Generalized anxiety disorder: Principal | ICD-10-CM

## 2019-12-23 DIAGNOSIS — Z79899 Other long term (current) drug therapy: Principal | ICD-10-CM

## 2019-12-23 DIAGNOSIS — Z6835 Body mass index (BMI) 35.0-35.9, adult: Secondary | ICD-10-CM | POA: Diagnosis not present

## 2019-12-23 MED ORDER — VENLAFAXINE ER 37.5 MG CAPSULE,EXTENDED RELEASE 24 HR
ORAL_CAPSULE | Freq: Every day | ORAL | 1 refills | 90.00000 days | Status: CP
Start: 2019-12-23 — End: 2020-06-20

## 2019-12-23 MED ORDER — ONDANSETRON HCL 8 MG TABLET
ORAL_TABLET | Freq: Two times a day (BID) | ORAL | 3 refills | 90 days | Status: CP
Start: 2019-12-23 — End: 2020-12-22

## 2019-12-23 MED ORDER — BUPRENORPHINE HCL 8 MG SUBLINGUAL TABLET
ORAL_TABLET | 1 refills | 0.00000 days | Status: CP
Start: 2019-12-23 — End: 2020-02-21

## 2019-12-23 MED ORDER — IBUPROFEN 800 MG TABLET
ORAL_TABLET | Freq: Three times a day (TID) | ORAL | 6 refills | 40.00000 days | Status: CP | PRN
Start: 2019-12-23 — End: 2020-12-22

## 2019-12-27 MED ORDER — PHENTERMINE 37.5 MG CAPSULE
ORAL_CAPSULE | Freq: Every morning | ORAL | 1 refills | 30 days | Status: CP
Start: 2019-12-27 — End: 2020-02-25

## 2020-02-04 DIAGNOSIS — G4733 Obstructive sleep apnea (adult) (pediatric): Secondary | ICD-10-CM | POA: Diagnosis not present

## 2020-02-22 ENCOUNTER — Ambulatory Visit: Admit: 2020-02-22 | Discharge: 2020-02-23 | Payer: PRIVATE HEALTH INSURANCE

## 2020-02-22 DIAGNOSIS — F1190 Opioid use, unspecified, uncomplicated: Principal | ICD-10-CM

## 2020-02-22 DIAGNOSIS — Z23 Encounter for immunization: Principal | ICD-10-CM

## 2020-02-22 DIAGNOSIS — J3489 Other specified disorders of nose and nasal sinuses: Principal | ICD-10-CM

## 2020-02-22 DIAGNOSIS — N529 Male erectile dysfunction, unspecified: Principal | ICD-10-CM

## 2020-02-22 DIAGNOSIS — Z79899 Other long term (current) drug therapy: Principal | ICD-10-CM

## 2020-02-22 DIAGNOSIS — F17200 Nicotine dependence, unspecified, uncomplicated: Principal | ICD-10-CM

## 2020-02-22 DIAGNOSIS — I10 Essential (primary) hypertension: Principal | ICD-10-CM

## 2020-02-22 DIAGNOSIS — Z6835 Body mass index (BMI) 35.0-35.9, adult: Secondary | ICD-10-CM | POA: Diagnosis not present

## 2020-02-22 MED ORDER — SILDENAFIL (PULMONARY HYPERTENSION) 20 MG TABLET
ORAL_TABLET | Freq: Every day | ORAL | 11 refills | 6.00000 days | Status: CP | PRN
Start: 2020-02-22 — End: 2021-02-21

## 2020-02-22 MED ORDER — OLMESARTAN 40 MG-AMLODIPINE 10 MG-HYDROCHLOROTHIAZIDE 25 MG TABLET
ORAL_TABLET | Freq: Every day | ORAL | 1 refills | 0.00000 days | Status: CP
Start: 2020-02-22 — End: 2020-08-20

## 2020-02-22 MED ORDER — BUPRENORPHINE HCL 8 MG SUBLINGUAL TABLET
ORAL_TABLET | 1 refills | 0 days | Status: CP
Start: 2020-02-22 — End: 2020-04-21

## 2020-02-22 MED ORDER — CETIRIZINE 10 MG TABLET
ORAL_TABLET | Freq: Every day | ORAL | 3 refills | 90 days | Status: CP
Start: 2020-02-22 — End: 2021-02-21

## 2020-03-02 MED ORDER — PHENTERMINE 37.5 MG CAPSULE
ORAL_CAPSULE | Freq: Every morning | ORAL | 1 refills | 30.00000 days | Status: CP
Start: 2020-03-02 — End: 2020-05-01

## 2020-03-03 ENCOUNTER — Encounter: Payer: BC Managed Care – PPO | Admitting: Family Medicine

## 2020-04-11 DIAGNOSIS — E113493 Type 2 diabetes mellitus with severe nonproliferative diabetic retinopathy without macular edema, bilateral: Secondary | ICD-10-CM | POA: Diagnosis not present

## 2020-04-11 DIAGNOSIS — Z794 Long term (current) use of insulin: Secondary | ICD-10-CM | POA: Diagnosis not present

## 2020-04-11 DIAGNOSIS — E113552 Type 2 diabetes mellitus with stable proliferative diabetic retinopathy, left eye: Secondary | ICD-10-CM | POA: Diagnosis not present

## 2020-04-12 DIAGNOSIS — E291 Testicular hypofunction: Secondary | ICD-10-CM | POA: Diagnosis not present

## 2020-04-12 DIAGNOSIS — Z5181 Encounter for therapeutic drug level monitoring: Secondary | ICD-10-CM | POA: Diagnosis not present

## 2020-04-12 DIAGNOSIS — E113493 Type 2 diabetes mellitus with severe nonproliferative diabetic retinopathy without macular edema, bilateral: Secondary | ICD-10-CM | POA: Diagnosis not present

## 2020-04-12 DIAGNOSIS — Z794 Long term (current) use of insulin: Secondary | ICD-10-CM | POA: Diagnosis not present

## 2020-04-12 DIAGNOSIS — Z720 Tobacco use: Secondary | ICD-10-CM | POA: Diagnosis not present

## 2020-04-18 ENCOUNTER — Ambulatory Visit: Admit: 2020-04-18 | Discharge: 2020-04-19 | Payer: PRIVATE HEALTH INSURANCE

## 2020-04-18 DIAGNOSIS — Z122 Encounter for screening for malignant neoplasm of respiratory organs: Principal | ICD-10-CM

## 2020-04-18 DIAGNOSIS — Z79899 Other long term (current) drug therapy: Principal | ICD-10-CM

## 2020-04-18 DIAGNOSIS — F1190 Opioid use, unspecified, uncomplicated: Principal | ICD-10-CM

## 2020-04-18 DIAGNOSIS — F17200 Nicotine dependence, unspecified, uncomplicated: Principal | ICD-10-CM

## 2020-04-18 DIAGNOSIS — F17210 Nicotine dependence, cigarettes, uncomplicated: Principal | ICD-10-CM

## 2020-04-18 DIAGNOSIS — H4052X2 Glaucoma secondary to other eye disorders, left eye, moderate stage: Secondary | ICD-10-CM | POA: Diagnosis not present

## 2020-04-18 DIAGNOSIS — Z6835 Body mass index (BMI) 35.0-35.9, adult: Secondary | ICD-10-CM | POA: Diagnosis not present

## 2020-04-18 MED ORDER — PHENTERMINE 37.5 MG CAPSULE
ORAL_CAPSULE | Freq: Every morning | ORAL | 0 refills | 90 days | Status: CP
Start: 2020-04-18 — End: 2020-07-17

## 2020-04-25 DIAGNOSIS — H4052X2 Glaucoma secondary to other eye disorders, left eye, moderate stage: Secondary | ICD-10-CM | POA: Diagnosis not present

## 2020-04-25 MED ORDER — BUPRENORPHINE HCL 8 MG SUBLINGUAL TABLET
ORAL_TABLET | 1 refills | 0 days | Status: CP
Start: 2020-04-25 — End: 2020-06-24

## 2020-05-05 DIAGNOSIS — G4733 Obstructive sleep apnea (adult) (pediatric): Secondary | ICD-10-CM | POA: Diagnosis not present

## 2020-05-11 ENCOUNTER — Ambulatory Visit: Admit: 2020-05-11 | Discharge: 2020-05-12 | Payer: PRIVATE HEALTH INSURANCE

## 2020-05-11 DIAGNOSIS — Z125 Encounter for screening for malignant neoplasm of prostate: Principal | ICD-10-CM

## 2020-05-11 DIAGNOSIS — R058 Other specified cough: Secondary | ICD-10-CM | POA: Diagnosis not present

## 2020-05-11 DIAGNOSIS — F17200 Nicotine dependence, unspecified, uncomplicated: Principal | ICD-10-CM

## 2020-05-11 DIAGNOSIS — Z0000 Encounter for general adult medical examination without abnormal findings: Principal | ICD-10-CM

## 2020-05-11 DIAGNOSIS — I10 Essential (primary) hypertension: Principal | ICD-10-CM

## 2020-05-11 DIAGNOSIS — Z136 Encounter for screening for cardiovascular disorders: Principal | ICD-10-CM

## 2020-05-11 DIAGNOSIS — Z6835 Body mass index (BMI) 35.0-35.9, adult: Secondary | ICD-10-CM | POA: Diagnosis not present

## 2020-05-11 MED ORDER — VARENICLINE 0.5 MG TABLET
ORAL_TABLET | 0 refills | 0 days | Status: CP
Start: 2020-05-11 — End: ?

## 2020-06-07 ENCOUNTER — Other Ambulatory Visit: Payer: Self-pay | Admitting: Family Medicine

## 2020-06-07 DIAGNOSIS — E7800 Pure hypercholesterolemia, unspecified: Secondary | ICD-10-CM

## 2020-06-08 MED ORDER — VARENICLINE 1 MG TABLET
ORAL_TABLET | Freq: Two times a day (BID) | ORAL | 1 refills | 30 days | Status: CP
Start: 2020-06-08 — End: 2020-08-07

## 2020-06-13 DIAGNOSIS — Z79899 Other long term (current) drug therapy: Principal | ICD-10-CM

## 2020-06-13 DIAGNOSIS — F1190 Opioid use, unspecified, uncomplicated: Principal | ICD-10-CM

## 2020-06-28 ENCOUNTER — Other Ambulatory Visit: Payer: Self-pay | Admitting: Family Medicine

## 2020-06-28 DIAGNOSIS — F411 Generalized anxiety disorder: Principal | ICD-10-CM

## 2020-06-28 MED ORDER — VENLAFAXINE ER 37.5 MG CAPSULE,EXTENDED RELEASE 24 HR
ORAL_CAPSULE | 1 refills | 0.00000 days | Status: CP
Start: 2020-06-28 — End: ?

## 2020-06-30 ENCOUNTER — Ambulatory Visit: Admit: 2020-06-30 | Discharge: 2020-07-01 | Payer: PRIVATE HEALTH INSURANCE

## 2020-06-30 DIAGNOSIS — Z125 Encounter for screening for malignant neoplasm of prostate: Principal | ICD-10-CM

## 2020-06-30 DIAGNOSIS — E785 Hyperlipidemia, unspecified: Principal | ICD-10-CM

## 2020-06-30 DIAGNOSIS — M545 Low back pain, unspecified: Secondary | ICD-10-CM | POA: Diagnosis not present

## 2020-06-30 DIAGNOSIS — F1190 Opioid use, unspecified, uncomplicated: Principal | ICD-10-CM

## 2020-06-30 DIAGNOSIS — J3489 Other specified disorders of nose and nasal sinuses: Principal | ICD-10-CM

## 2020-06-30 DIAGNOSIS — Z79899 Other long term (current) drug therapy: Principal | ICD-10-CM

## 2020-06-30 DIAGNOSIS — Z6834 Body mass index (BMI) 34.0-34.9, adult: Secondary | ICD-10-CM | POA: Diagnosis not present

## 2020-06-30 MED ORDER — FLUTICASONE PROPIONATE 50 MCG/ACTUATION NASAL SPRAY,SUSPENSION
Freq: Two times a day (BID) | NASAL | 11 refills | 0 days | Status: CP
Start: 2020-06-30 — End: 2021-06-30

## 2020-06-30 MED ORDER — MELOXICAM 7.5 MG TABLET
ORAL_TABLET | Freq: Two times a day (BID) | ORAL | 0 refills | 30 days | Status: CP | PRN
Start: 2020-06-30 — End: 2020-07-30

## 2020-06-30 MED ORDER — PHENTERMINE 37.5 MG CAPSULE
ORAL_CAPSULE | Freq: Every morning | ORAL | 0 refills | 90 days | Status: CP
Start: 2020-06-30 — End: 2020-09-28

## 2020-07-01 ENCOUNTER — Ambulatory Visit: Admit: 2020-07-01 | Discharge: 2020-07-02 | Payer: PRIVATE HEALTH INSURANCE

## 2020-07-01 DIAGNOSIS — Z0000 Encounter for general adult medical examination without abnormal findings: Secondary | ICD-10-CM | POA: Diagnosis not present

## 2020-07-01 DIAGNOSIS — Z136 Encounter for screening for cardiovascular disorders: Secondary | ICD-10-CM | POA: Diagnosis not present

## 2020-07-04 MED ORDER — BUPRENORPHINE HCL 8 MG SUBLINGUAL TABLET
ORAL_TABLET | 1 refills | 0 days | Status: CP
Start: 2020-07-04 — End: 2020-08-28

## 2020-08-03 DIAGNOSIS — G4733 Obstructive sleep apnea (adult) (pediatric): Secondary | ICD-10-CM | POA: Diagnosis not present

## 2020-08-16 ENCOUNTER — Telehealth: Payer: Self-pay | Admitting: *Deleted

## 2020-08-16 ENCOUNTER — Encounter: Payer: Self-pay | Admitting: *Deleted

## 2020-08-16 DIAGNOSIS — F17200 Nicotine dependence, unspecified, uncomplicated: Secondary | ICD-10-CM

## 2020-08-16 DIAGNOSIS — Z122 Encounter for screening for malignant neoplasm of respiratory organs: Secondary | ICD-10-CM

## 2020-08-16 DIAGNOSIS — Z87891 Personal history of nicotine dependence: Secondary | ICD-10-CM

## 2020-08-16 NOTE — Telephone Encounter (Signed)
Received referral for initial lung cancer screening scan. Contacted patient and obtained smoking history,(current every day smoker, 0.75 ppd x 30 yrs.) as well as answering questions related to screening process. Patient denies signs of lung cancer such as weight loss or hemoptysis. Patient denies comorbidity that would prevent curative treatment if lung cancer were found. Patient is scheduled for shared decision making visit and CT scan on 09/05/20@1 :30 pm.

## 2020-08-16 NOTE — Telephone Encounter (Signed)
Received referral for low dose lung cancer screening CT scan. Message left at phone number listed in EMR for patient to call me back to facilitate scheduling scan.  

## 2020-08-29 ENCOUNTER — Ambulatory Visit: Admit: 2020-08-29 | Discharge: 2020-08-30 | Payer: PRIVATE HEALTH INSURANCE

## 2020-08-29 DIAGNOSIS — M545 Low back pain, unspecified: Secondary | ICD-10-CM | POA: Diagnosis not present

## 2020-08-29 DIAGNOSIS — Z79899 Other long term (current) drug therapy: Principal | ICD-10-CM

## 2020-08-29 DIAGNOSIS — E113293 Type 2 diabetes mellitus with mild nonproliferative diabetic retinopathy without macular edema, bilateral: Principal | ICD-10-CM

## 2020-08-29 DIAGNOSIS — F411 Generalized anxiety disorder: Principal | ICD-10-CM

## 2020-08-29 DIAGNOSIS — F1190 Opioid use, unspecified, uncomplicated: Principal | ICD-10-CM

## 2020-08-29 DIAGNOSIS — E785 Hyperlipidemia, unspecified: Principal | ICD-10-CM

## 2020-08-29 DIAGNOSIS — M75102 Unspecified rotator cuff tear or rupture of left shoulder, not specified as traumatic: Principal | ICD-10-CM

## 2020-08-29 DIAGNOSIS — I10 Essential (primary) hypertension: Principal | ICD-10-CM

## 2020-08-29 DIAGNOSIS — Z794 Long term (current) use of insulin: Principal | ICD-10-CM

## 2020-08-29 DIAGNOSIS — R519 Nonintractable headache, unspecified chronicity pattern, unspecified headache type: Principal | ICD-10-CM

## 2020-08-29 DIAGNOSIS — Z6835 Body mass index (BMI) 35.0-35.9, adult: Secondary | ICD-10-CM | POA: Diagnosis not present

## 2020-08-29 MED ORDER — IBUPROFEN 800 MG TABLET
ORAL_TABLET | Freq: Three times a day (TID) | ORAL | 6 refills | 40 days | Status: CP | PRN
Start: 2020-08-29 — End: 2021-08-29

## 2020-08-29 MED ORDER — VENLAFAXINE ER 37.5 MG CAPSULE,EXTENDED RELEASE 24 HR
ORAL_CAPSULE | Freq: Every day | ORAL | 3 refills | 90.00000 days | Status: CP
Start: 2020-08-29 — End: 2021-08-29

## 2020-08-29 MED ORDER — BUPRENORPHINE HCL 8 MG SUBLINGUAL TABLET
ORAL_TABLET | 1 refills | 0 days | Status: CP
Start: 2020-08-29 — End: 2020-10-22

## 2020-09-05 ENCOUNTER — Other Ambulatory Visit: Payer: Self-pay

## 2020-09-05 ENCOUNTER — Ambulatory Visit
Admission: RE | Admit: 2020-09-05 | Discharge: 2020-09-05 | Disposition: A | Discharge status: Home / Self Care | Payer: PPO | Source: Ambulatory Visit | Attending: Oncology | Admitting: Oncology

## 2020-09-05 ENCOUNTER — Inpatient Hospital Stay: Payer: PPO | Attending: Oncology | Admitting: Nurse Practitioner

## 2020-09-05 DIAGNOSIS — F17200 Nicotine dependence, unspecified, uncomplicated: Secondary | ICD-10-CM | POA: Diagnosis not present

## 2020-09-05 DIAGNOSIS — Z87891 Personal history of nicotine dependence: Secondary | ICD-10-CM

## 2020-09-05 DIAGNOSIS — F17210 Nicotine dependence, cigarettes, uncomplicated: Secondary | ICD-10-CM

## 2020-09-05 DIAGNOSIS — Z122 Encounter for screening for malignant neoplasm of respiratory organs: Secondary | ICD-10-CM | POA: Diagnosis not present

## 2020-09-05 NOTE — Progress Notes (Signed)
Virtual Visit via Video Enabled Telemedicine Note   I connected with Joshua NIBERT Sr. on 09/05/20 at 1:45 PM EST by video enabled telemedicine visit and verified that I am speaking with the correct person using two identifiers.   I discussed the limitations, risks, security and privacy concerns of performing an evaluation and management service by telemedicine and the availability of in-person appointments. I also discussed with the patient that there may be a patient responsible charge related to this service. The patient expressed understanding and agreed to proceed.   Other persons participating in the visit and their role in the encounter: Burgess Estelle, RN- checking in patient & navigation  Patient's location: [place]  Provider's location: [place]   Chief Complaint: Low Dose CT Screening  Patient agreed to evaluation by telemedicine to discuss shared decision making for consideration of low dose CT lung cancer screening.    In accordance with CMS guidelines, patient has met eligibility criteria including age, absence of signs or symptoms of lung cancer.  Social History   Tobacco Use  . Smoking status: Current Every Day Smoker    Packs/day: 0.75    Years: 30.00    Pack years: 22.50    Types: Cigarettes  . Smokeless tobacco: Never Used  . Tobacco comment: had quit. started back recently.  Substance Use Topics  . Alcohol use: Yes    Comment: occasional   A shared decision-making session was conducted prior to the performance of CT scan. This includes one or more decision aids, includes benefits and harms of screening, follow-up diagnostic testing, over-diagnosis, false positive rate, and total radiation exposure.  Counseling on the importance of adherence to annual lung cancer LDCT screening, impact of co-morbidities, and ability or willingness to undergo diagnosis and treatment is imperative for compliance of the program.  Counseling on the importance of  continued smoking cessation for former smokers; the importance of smoking cessation for current smokers, and information about tobacco cessation interventions have been given to patient including [place] and [employer] [place] programs.   Written order for lung cancer screening with LDCT has been given to the patient and any and all questions have been answered to the best of my abilities.    Yearly follow up will be coordinated by Burgess Estelle, Thoracic Navigator.  I discussed the assessment and treatment plan with the patient. The patient was provided an opportunity to ask questions and all were answered. The patient agreed with the plan and demonstrated an understanding of the instructions.   The patient was advised to call back or seek an in-person evaluation if the symptoms worsen or if the condition fails to improve as anticipated.   I provided 15 minutes of face-to-face video visit time dedicated to the care of this patient on the date of this encounter to include pre-visit review of smoking history, face-to-face time with the patient, and post visit ordering of testing/documentation.   Beckey Rutter, DNP, AGNP-C [place] at [facility] [phone] (clinic)

## 2020-09-06 DIAGNOSIS — E782 Mixed hyperlipidemia: Secondary | ICD-10-CM | POA: Diagnosis not present

## 2020-09-06 DIAGNOSIS — R0602 Shortness of breath: Secondary | ICD-10-CM | POA: Diagnosis not present

## 2020-09-06 DIAGNOSIS — I10 Essential (primary) hypertension: Secondary | ICD-10-CM | POA: Diagnosis not present

## 2020-09-06 DIAGNOSIS — G4733 Obstructive sleep apnea (adult) (pediatric): Secondary | ICD-10-CM | POA: Diagnosis not present

## 2020-09-06 DIAGNOSIS — I2510 Atherosclerotic heart disease of native coronary artery without angina pectoris: Secondary | ICD-10-CM | POA: Diagnosis not present

## 2020-09-12 ENCOUNTER — Encounter: Payer: Self-pay | Admitting: *Deleted

## 2020-10-05 DIAGNOSIS — Z5181 Encounter for therapeutic drug level monitoring: Secondary | ICD-10-CM | POA: Diagnosis not present

## 2020-10-05 DIAGNOSIS — E113493 Type 2 diabetes mellitus with severe nonproliferative diabetic retinopathy without macular edema, bilateral: Secondary | ICD-10-CM | POA: Diagnosis not present

## 2020-10-05 DIAGNOSIS — Z794 Long term (current) use of insulin: Secondary | ICD-10-CM | POA: Diagnosis not present

## 2020-10-05 DIAGNOSIS — E291 Testicular hypofunction: Secondary | ICD-10-CM | POA: Diagnosis not present

## 2020-10-05 DIAGNOSIS — Z125 Encounter for screening for malignant neoplasm of prostate: Secondary | ICD-10-CM | POA: Diagnosis not present

## 2020-10-05 DIAGNOSIS — E1169 Type 2 diabetes mellitus with other specified complication: Secondary | ICD-10-CM | POA: Diagnosis not present

## 2020-10-05 DIAGNOSIS — E669 Obesity, unspecified: Secondary | ICD-10-CM | POA: Diagnosis not present

## 2020-10-12 DIAGNOSIS — E1129 Type 2 diabetes mellitus with other diabetic kidney complication: Secondary | ICD-10-CM | POA: Diagnosis not present

## 2020-10-12 DIAGNOSIS — Z794 Long term (current) use of insulin: Secondary | ICD-10-CM | POA: Diagnosis not present

## 2020-10-12 DIAGNOSIS — E1169 Type 2 diabetes mellitus with other specified complication: Secondary | ICD-10-CM | POA: Diagnosis not present

## 2020-10-12 DIAGNOSIS — E291 Testicular hypofunction: Secondary | ICD-10-CM | POA: Diagnosis not present

## 2020-10-12 DIAGNOSIS — R809 Proteinuria, unspecified: Secondary | ICD-10-CM | POA: Diagnosis not present

## 2020-10-12 DIAGNOSIS — Z5181 Encounter for therapeutic drug level monitoring: Secondary | ICD-10-CM | POA: Diagnosis not present

## 2020-10-12 DIAGNOSIS — E113493 Type 2 diabetes mellitus with severe nonproliferative diabetic retinopathy without macular edema, bilateral: Secondary | ICD-10-CM | POA: Diagnosis not present

## 2020-10-12 DIAGNOSIS — E669 Obesity, unspecified: Secondary | ICD-10-CM | POA: Diagnosis not present

## 2020-10-18 DIAGNOSIS — R0602 Shortness of breath: Secondary | ICD-10-CM | POA: Diagnosis not present

## 2020-10-18 DIAGNOSIS — I2510 Atherosclerotic heart disease of native coronary artery without angina pectoris: Secondary | ICD-10-CM | POA: Diagnosis not present

## 2020-10-24 ENCOUNTER — Ambulatory Visit: Admit: 2020-10-24 | Discharge: 2020-10-25 | Payer: PRIVATE HEALTH INSURANCE

## 2020-10-24 DIAGNOSIS — Z79899 Other long term (current) drug therapy: Principal | ICD-10-CM

## 2020-10-24 DIAGNOSIS — F17200 Nicotine dependence, unspecified, uncomplicated: Principal | ICD-10-CM

## 2020-10-24 DIAGNOSIS — F1190 Opioid use, unspecified, uncomplicated: Principal | ICD-10-CM

## 2020-10-24 DIAGNOSIS — Z23 Encounter for immunization: Principal | ICD-10-CM

## 2020-10-24 DIAGNOSIS — F411 Generalized anxiety disorder: Principal | ICD-10-CM

## 2020-10-24 DIAGNOSIS — R310 Gross hematuria: Principal | ICD-10-CM

## 2020-10-24 DIAGNOSIS — N529 Male erectile dysfunction, unspecified: Principal | ICD-10-CM

## 2020-10-24 DIAGNOSIS — Z6834 Body mass index (BMI) 34.0-34.9, adult: Secondary | ICD-10-CM | POA: Diagnosis not present

## 2020-10-24 DIAGNOSIS — H4052X2 Glaucoma secondary to other eye disorders, left eye, moderate stage: Secondary | ICD-10-CM | POA: Diagnosis not present

## 2020-10-24 MED ORDER — BUPRENORPHINE HCL 8 MG SUBLINGUAL TABLET
ORAL_TABLET | 1 refills | 0.00000 days | Status: CP
Start: 2020-10-24 — End: 2020-12-16

## 2020-11-03 ENCOUNTER — Other Ambulatory Visit: Payer: Self-pay | Admitting: Family Medicine

## 2020-11-03 DIAGNOSIS — E7800 Pure hypercholesterolemia, unspecified: Secondary | ICD-10-CM

## 2020-11-03 DIAGNOSIS — G4733 Obstructive sleep apnea (adult) (pediatric): Secondary | ICD-10-CM | POA: Diagnosis not present

## 2020-11-03 NOTE — Telephone Encounter (Signed)
Notes to clinic:  For next 90 day refill  Medsync patient    Requested Prescriptions  Pending Prescriptions Disp Refills   ezetimibe (ZETIA) 10 MG tablet [Pharmacy Med Name: EZETIMIBE 10 MG TAB] 90 tablet 0    Sig: TAKE ONE TABLET EVERY DAY      Cardiovascular:  Antilipid - Sterol Transport Inhibitors Failed - 11/03/2020  2:39 PM      Failed - Total Cholesterol in normal range and within 360 days    Cholesterol, Total  Date Value Ref Range Status  09/01/2019 142 100 - 199 mg/dL Final   Cholesterol Piccolo, Waived  Date Value Ref Range Status  02/03/2018 172 <200 mg/dL Final    Comment:                            Desirable                <200                         Borderline High      200- 239                         High                     >239           Failed - LDL in normal range and within 360 days    LDL Chol Calc (NIH)  Date Value Ref Range Status  09/01/2019 85 0 - 99 mg/dL Final          Failed - HDL in normal range and within 360 days    HDL  Date Value Ref Range Status  09/01/2019 36 (L) >39 mg/dL Final          Failed - Triglycerides in normal range and within 360 days    Triglycerides  Date Value Ref Range Status  09/01/2019 117 0 - 149 mg/dL Final   Triglycerides Piccolo,Waived  Date Value Ref Range Status  02/03/2018 297 (H) <150 mg/dL Final    Comment:                            Normal                   <150                         Borderline High     150 - 199                         High                200 - 499                         Very High                >499           Failed - Valid encounter within last 12 months    Recent Outpatient Visits           1 year ago Essential hypertension   [facility] [place], Megan P, DO   2 years ago Pure hypercholesterolemia  [facility], Jeannette How, MD   2 years ago Essential hypertension   [place], Jeannette How, MD   3 years ago  Essential hypertension   [place], Jeannette How, MD   3 years ago Pure hypercholesterolemia   [facility], Jeannette How, MD

## 2020-11-04 NOTE — Telephone Encounter (Signed)
Would pt need apt? 

## 2020-11-04 NOTE — Telephone Encounter (Signed)
Patient has not been seen since April 2021 and appears to be seeing [facility] family medicine. Please find out if he has changed providers

## 2020-11-11 NOTE — Telephone Encounter (Signed)
Called pt he is not longer under our care

## 2020-12-21 ENCOUNTER — Ambulatory Visit: Admit: 2020-12-21 | Discharge: 2020-12-22 | Payer: PRIVATE HEALTH INSURANCE

## 2020-12-21 DIAGNOSIS — R5383 Other fatigue: Principal | ICD-10-CM

## 2020-12-21 DIAGNOSIS — R799 Abnormal finding of blood chemistry, unspecified: Principal | ICD-10-CM

## 2020-12-21 DIAGNOSIS — Z79899 Other long term (current) drug therapy: Principal | ICD-10-CM

## 2020-12-21 DIAGNOSIS — E785 Hyperlipidemia, unspecified: Principal | ICD-10-CM

## 2020-12-21 DIAGNOSIS — F1190 Opioid use, unspecified, uncomplicated: Principal | ICD-10-CM

## 2020-12-21 DIAGNOSIS — Z23 Encounter for immunization: Principal | ICD-10-CM

## 2020-12-21 DIAGNOSIS — G4700 Insomnia, unspecified: Principal | ICD-10-CM

## 2020-12-21 DIAGNOSIS — R110 Nausea: Principal | ICD-10-CM

## 2020-12-21 DIAGNOSIS — Z6835 Body mass index (BMI) 35.0-35.9, adult: Secondary | ICD-10-CM | POA: Diagnosis not present

## 2020-12-21 MED ORDER — ZOLPIDEM 10 MG TABLET
ORAL_TABLET | 0 refills | 0 days | Status: CP
Start: 2020-12-21 — End: ?

## 2020-12-21 MED ORDER — BUPRENORPHINE HCL 8 MG SUBLINGUAL TABLET
ORAL_TABLET | 1 refills | 0.00000 days | Status: CP
Start: 2020-12-21 — End: 2021-02-11

## 2020-12-21 MED ORDER — PHENTERMINE 37.5 MG CAPSULE
ORAL_CAPSULE | Freq: Every morning | ORAL | 0 refills | 90.00000 days | Status: CP
Start: 2020-12-21 — End: 2021-03-21

## 2020-12-21 MED ORDER — ONDANSETRON HCL 8 MG TABLET
ORAL_TABLET | Freq: Two times a day (BID) | ORAL | 3 refills | 90 days | Status: CP
Start: 2020-12-21 — End: 2021-12-21

## 2021-01-02 ENCOUNTER — Other Ambulatory Visit: Payer: Self-pay | Admitting: Family Medicine

## 2021-01-02 NOTE — Telephone Encounter (Signed)
Joshua Highland, MD not at this practice

## 2021-02-01 DIAGNOSIS — J3489 Other specified disorders of nose and nasal sinuses: Principal | ICD-10-CM

## 2021-02-02 MED ORDER — CETIRIZINE 10 MG TABLET
ORAL_TABLET | 3 refills | 0.00000 days | Status: CP
Start: 2021-02-02 — End: ?

## 2021-02-03 DIAGNOSIS — G4733 Obstructive sleep apnea (adult) (pediatric): Secondary | ICD-10-CM | POA: Diagnosis not present

## 2021-02-21 DIAGNOSIS — M19012 Primary osteoarthritis, left shoulder: Secondary | ICD-10-CM | POA: Diagnosis not present

## 2021-02-21 DIAGNOSIS — M25512 Pain in left shoulder: Secondary | ICD-10-CM | POA: Diagnosis not present

## 2021-02-22 ENCOUNTER — Ambulatory Visit: Admit: 2021-02-22 | Discharge: 2021-02-23 | Payer: PRIVATE HEALTH INSURANCE

## 2021-02-22 DIAGNOSIS — F1190 Opioid use, unspecified, uncomplicated: Principal | ICD-10-CM

## 2021-02-22 DIAGNOSIS — F411 Generalized anxiety disorder: Principal | ICD-10-CM

## 2021-02-22 DIAGNOSIS — F32A Depression, unspecified depression type: Principal | ICD-10-CM

## 2021-02-22 DIAGNOSIS — Z6835 Body mass index (BMI) 35.0-35.9, adult: Secondary | ICD-10-CM | POA: Diagnosis not present

## 2021-02-22 MED ORDER — FLUOXETINE 20 MG TABLET
ORAL_TABLET | Freq: Every day | ORAL | 2 refills | 30.00000 days | Status: CP
Start: 2021-02-22 — End: 2021-05-23

## 2021-02-22 MED ORDER — BUPRENORPHINE HCL 8 MG SUBLINGUAL TABLET
ORAL_TABLET | 1 refills | 0 days | Status: CP
Start: 2021-02-22 — End: 2021-04-14

## 2021-02-22 MED ORDER — NALOXONE 4 MG/ACTUATION NASAL SPRAY
0 refills | 0.00000 days | Status: CP
Start: 2021-02-22 — End: ?

## 2021-03-09 DIAGNOSIS — I200 Unstable angina: Secondary | ICD-10-CM | POA: Diagnosis not present

## 2021-03-09 DIAGNOSIS — E782 Mixed hyperlipidemia: Secondary | ICD-10-CM | POA: Diagnosis not present

## 2021-03-09 DIAGNOSIS — E669 Obesity, unspecified: Secondary | ICD-10-CM | POA: Diagnosis not present

## 2021-03-09 DIAGNOSIS — G4733 Obstructive sleep apnea (adult) (pediatric): Secondary | ICD-10-CM | POA: Diagnosis not present

## 2021-03-09 DIAGNOSIS — R079 Chest pain, unspecified: Secondary | ICD-10-CM | POA: Diagnosis not present

## 2021-03-09 DIAGNOSIS — R0602 Shortness of breath: Secondary | ICD-10-CM | POA: Diagnosis not present

## 2021-03-09 DIAGNOSIS — I2510 Atherosclerotic heart disease of native coronary artery without angina pectoris: Secondary | ICD-10-CM | POA: Diagnosis not present

## 2021-03-09 DIAGNOSIS — I10 Essential (primary) hypertension: Secondary | ICD-10-CM | POA: Diagnosis not present

## 2021-03-11 DIAGNOSIS — F32A Depression, unspecified depression type: Principal | ICD-10-CM

## 2021-03-11 DIAGNOSIS — F411 Generalized anxiety disorder: Principal | ICD-10-CM

## 2021-03-13 DIAGNOSIS — F32A Depression, unspecified depression type: Principal | ICD-10-CM

## 2021-03-13 DIAGNOSIS — F411 Generalized anxiety disorder: Principal | ICD-10-CM

## 2021-03-13 MED ORDER — FLUOXETINE 20 MG TABLET
ORAL_TABLET | Freq: Every day | ORAL | 2 refills | 30 days | Status: CP
Start: 2021-03-13 — End: 2021-06-11

## 2021-03-20 ENCOUNTER — Ambulatory Visit: Payer: Self-pay | Admitting: Cardiovascular Disease

## 2021-03-20 DIAGNOSIS — I2510 Atherosclerotic heart disease of native coronary artery without angina pectoris: Secondary | ICD-10-CM | POA: Diagnosis not present

## 2021-03-20 DIAGNOSIS — E782 Mixed hyperlipidemia: Secondary | ICD-10-CM | POA: Diagnosis not present

## 2021-03-20 DIAGNOSIS — I10 Essential (primary) hypertension: Secondary | ICD-10-CM | POA: Diagnosis not present

## 2021-03-20 DIAGNOSIS — R079 Chest pain, unspecified: Secondary | ICD-10-CM | POA: Insufficient documentation

## 2021-03-20 DIAGNOSIS — E669 Obesity, unspecified: Secondary | ICD-10-CM | POA: Diagnosis not present

## 2021-03-20 DIAGNOSIS — G4733 Obstructive sleep apnea (adult) (pediatric): Secondary | ICD-10-CM | POA: Diagnosis not present

## 2021-03-20 DIAGNOSIS — F17200 Nicotine dependence, unspecified, uncomplicated: Secondary | ICD-10-CM | POA: Diagnosis not present

## 2021-03-20 MED ORDER — SODIUM CHLORIDE 0.9% FLUSH: 3.0000 mL | Freq: Two times a day (BID) | INTRAVENOUS | Status: AC

## 2021-03-24 ENCOUNTER — Encounter
Admission: RE | Disposition: A | Discharge status: Home / Self Care | Payer: Self-pay | Source: Home / Self Care | Attending: Cardiovascular Disease

## 2021-03-24 ENCOUNTER — Ambulatory Visit
Admission: RE | Admit: 2021-03-24 | Discharge: 2021-03-24 | Disposition: A | Discharge status: Home / Self Care | Payer: PPO | Attending: Cardiovascular Disease | Admitting: Cardiovascular Disease

## 2021-03-24 ENCOUNTER — Encounter: Payer: Self-pay | Admitting: Cardiovascular Disease

## 2021-03-24 ENCOUNTER — Other Ambulatory Visit: Payer: Self-pay

## 2021-03-24 DIAGNOSIS — Z01818 Encounter for other preprocedural examination: Principal | ICD-10-CM

## 2021-03-24 DIAGNOSIS — I2510 Atherosclerotic heart disease of native coronary artery without angina pectoris: Principal | ICD-10-CM

## 2021-03-24 DIAGNOSIS — G4730 Sleep apnea, unspecified: Principal | ICD-10-CM

## 2021-03-24 DIAGNOSIS — I2582 Chronic total occlusion of coronary artery: Secondary | ICD-10-CM | POA: Insufficient documentation

## 2021-03-24 DIAGNOSIS — I200 Unstable angina: Secondary | ICD-10-CM | POA: Diagnosis not present

## 2021-03-24 DIAGNOSIS — R079 Chest pain, unspecified: Secondary | ICD-10-CM | POA: Diagnosis present

## 2021-03-24 HISTORY — PX: LEFT HEART CATH AND CORONARY ANGIOGRAPHY: CATH118249

## 2021-03-24 LAB — GLUCOSE, CAPILLARY: Glucose-Capillary: 80 mg/dL (ref 70–99)

## 2021-03-24 SURGERY — LEFT HEART CATH AND CORONARY ANGIOGRAPHY
Anesthesia: Moderate Sedation

## 2021-03-24 MED ORDER — ACETAMINOPHEN 325 MG PO TABS: 650.0000 mg | ORAL_TABLET | ORAL | Status: DC | PRN

## 2021-03-24 MED ORDER — ASPIRIN 81 MG PO CHEW
CHEWABLE_TABLET | ORAL | Status: AC
  Filled 2021-03-24: qty 1

## 2021-03-24 MED ORDER — LIDOCAINE HCL (PF) 1 % IJ SOLN
INTRAMUSCULAR | Status: DC | PRN
  Administered 2021-03-24: 20 mL

## 2021-03-24 MED ORDER — LIDOCAINE HCL 1 % IJ SOLN
INTRAMUSCULAR | Status: AC
  Filled 2021-03-24: qty 20

## 2021-03-24 MED ORDER — NITROGLYCERIN 1 MG/10 ML FOR IR/CATH LAB
INTRA_ARTERIAL | Status: DC | PRN
  Administered 2021-03-24: 200 ug via INTRACORONARY

## 2021-03-24 MED ORDER — SODIUM CHLORIDE 0.9 % WEIGHT BASED INFUSION: 3.0000 mL/kg/h | INTRAVENOUS | Status: DC

## 2021-03-24 MED ORDER — MIDAZOLAM HCL 2 MG/2ML IJ SOLN
INTRAMUSCULAR | Status: AC
  Filled 2021-03-24: qty 2

## 2021-03-24 MED ORDER — FENTANYL CITRATE (PF) 100 MCG/2ML IJ SOLN
INTRAMUSCULAR | Status: AC
  Filled 2021-03-24: qty 2

## 2021-03-24 MED ORDER — ONDANSETRON HCL 4 MG/2ML IJ SOLN: 4.0000 mg | Freq: Four times a day (QID) | INTRAMUSCULAR | Status: DC | PRN

## 2021-03-24 MED ORDER — SODIUM CHLORIDE 0.9 % WEIGHT BASED INFUSION: 1.0000 mL/kg/h | INTRAVENOUS | Status: DC

## 2021-03-24 MED ORDER — SODIUM CHLORIDE 0.9 % IV SOLN: 250.0000 mL | INTRAVENOUS | Status: DC | PRN

## 2021-03-24 MED ORDER — NITROGLYCERIN 1 MG/10 ML FOR IR/CATH LAB
INTRA_ARTERIAL | Status: AC
  Filled 2021-03-24: qty 10

## 2021-03-24 MED ORDER — HEPARIN (PORCINE) IN NACL 1000-0.9 UT/500ML-% IV SOLN
INTRAVENOUS | Status: AC
  Filled 2021-03-24: qty 1000

## 2021-03-24 MED ORDER — SODIUM CHLORIDE 0.9% FLUSH: 3.0000 mL | Freq: Two times a day (BID) | INTRAVENOUS | Status: DC

## 2021-03-24 MED ORDER — SODIUM CHLORIDE 0.9% FLUSH: 3.0000 mL | INTRAVENOUS | Status: DC | PRN

## 2021-03-24 MED ORDER — LABETALOL HCL 5 MG/ML IV SOLN: 10.0000 mg | INTRAVENOUS | Status: DC | PRN

## 2021-03-24 MED ORDER — FENTANYL CITRATE (PF) 100 MCG/2ML IJ SOLN
INTRAMUSCULAR | Status: DC | PRN
  Administered 2021-03-24: 50 ug via INTRAVENOUS

## 2021-03-24 MED ORDER — HEPARIN (PORCINE) IN NACL 1000-0.9 UT/500ML-% IV SOLN
INTRAVENOUS | Status: DC | PRN
  Administered 2021-03-24: 1000 mL

## 2021-03-24 MED ORDER — ASPIRIN 81 MG PO CHEW
81.0000 mg | CHEWABLE_TABLET | ORAL | Status: AC
  Administered 2021-03-24: 81 mg via ORAL

## 2021-03-24 MED ORDER — HYDRALAZINE HCL 20 MG/ML IJ SOLN: 10.0000 mg | INTRAMUSCULAR | Status: DC | PRN

## 2021-03-24 MED ORDER — MIDAZOLAM HCL 2 MG/2ML IJ SOLN
INTRAMUSCULAR | Status: DC | PRN
  Administered 2021-03-24: 1 mg via INTRAVENOUS

## 2021-03-24 MED ORDER — IOHEXOL 350 MG/ML SOLN
INTRAVENOUS | Status: DC | PRN
  Administered 2021-03-24: 80 mL

## 2021-03-24 SURGICAL SUPPLY — 11 items
CATH INFINITI 5FR ANG PIGTAIL (CATHETERS) ×2 IMPLANT
CATH INFINITI 5FR JL4 (CATHETERS) ×2 IMPLANT
CATH INFINITI JR4 5F (CATHETERS) ×2 IMPLANT
DEVICE CLOSURE MYNXGRIP 5F (Vascular Products) ×2 IMPLANT
NEEDLE PERC 18GX7CM (NEEDLE) ×2 IMPLANT
PACK CARDIAC CATH (CUSTOM PROCEDURE TRAY) ×2 IMPLANT
PROTECTION STATION PRESSURIZED (MISCELLANEOUS) ×2
SET ATX SIMPLICITY (MISCELLANEOUS) ×2 IMPLANT
SHEATH AVANTI 5FR X 11CM (SHEATH) ×2 IMPLANT
STATION PROTECTION PRESSURIZED (MISCELLANEOUS) ×1 IMPLANT
WIRE GUIDERIGHT .035X150 (WIRE) ×2 IMPLANT

## 2021-03-28 ENCOUNTER — Ambulatory Visit: Admit: 2021-03-28 | Discharge: 2021-03-29 | Payer: PRIVATE HEALTH INSURANCE

## 2021-03-28 DIAGNOSIS — I2510 Atherosclerotic heart disease of native coronary artery without angina pectoris: Principal | ICD-10-CM

## 2021-03-28 DIAGNOSIS — Z720 Tobacco use: Secondary | ICD-10-CM | POA: Diagnosis not present

## 2021-03-28 DIAGNOSIS — Z01810 Encounter for preprocedural cardiovascular examination: Secondary | ICD-10-CM | POA: Diagnosis not present

## 2021-03-28 DIAGNOSIS — Z794 Long term (current) use of insulin: Secondary | ICD-10-CM | POA: Diagnosis not present

## 2021-03-28 DIAGNOSIS — I10 Essential (primary) hypertension: Secondary | ICD-10-CM | POA: Diagnosis not present

## 2021-03-28 DIAGNOSIS — E785 Hyperlipidemia, unspecified: Secondary | ICD-10-CM | POA: Diagnosis not present

## 2021-03-28 DIAGNOSIS — E119 Type 2 diabetes mellitus without complications: Secondary | ICD-10-CM | POA: Diagnosis not present

## 2021-03-28 DIAGNOSIS — F17210 Nicotine dependence, cigarettes, uncomplicated: Secondary | ICD-10-CM | POA: Diagnosis not present

## 2021-03-28 DIAGNOSIS — Z01818 Encounter for other preprocedural examination: Secondary | ICD-10-CM | POA: Diagnosis not present

## 2021-03-28 DIAGNOSIS — I6523 Occlusion and stenosis of bilateral carotid arteries: Secondary | ICD-10-CM | POA: Diagnosis not present

## 2021-03-29 ENCOUNTER — Ambulatory Visit: Admit: 2021-03-29 | Discharge: 2021-03-30 | Payer: PRIVATE HEALTH INSURANCE

## 2021-03-29 DIAGNOSIS — I25118 Atherosclerotic heart disease of native coronary artery with other forms of angina pectoris: Principal | ICD-10-CM

## 2021-03-29 DIAGNOSIS — Z01818 Encounter for other preprocedural examination: Principal | ICD-10-CM

## 2021-03-29 DIAGNOSIS — E1159 Type 2 diabetes mellitus with other circulatory complications: Principal | ICD-10-CM

## 2021-03-29 DIAGNOSIS — R0609 Other forms of dyspnea: Principal | ICD-10-CM

## 2021-03-29 DIAGNOSIS — Z6835 Body mass index (BMI) 35.0-35.9, adult: Secondary | ICD-10-CM | POA: Diagnosis not present

## 2021-03-29 MED ORDER — EVOLOCUMAB 140 MG/ML SUBCUTANEOUS SYRINGE
SUBCUTANEOUS | 11 refills | 0 days | Status: CP
Start: 2021-03-29 — End: ?

## 2021-04-04 DIAGNOSIS — I2510 Atherosclerotic heart disease of native coronary artery without angina pectoris: Principal | ICD-10-CM

## 2021-04-05 ENCOUNTER — Ambulatory Visit: Admit: 2021-04-05 | Discharge: 2021-04-06 | Payer: PRIVATE HEALTH INSURANCE

## 2021-04-05 ENCOUNTER — Ambulatory Visit
Admit: 2021-04-05 | Discharge: 2021-04-06 | Payer: PRIVATE HEALTH INSURANCE | Attending: Nurse Practitioner | Primary: Nurse Practitioner

## 2021-04-05 DIAGNOSIS — I2510 Atherosclerotic heart disease of native coronary artery without angina pectoris: Principal | ICD-10-CM

## 2021-04-05 DIAGNOSIS — F17200 Nicotine dependence, unspecified, uncomplicated: Principal | ICD-10-CM

## 2021-04-05 DIAGNOSIS — I25118 Atherosclerotic heart disease of native coronary artery with other forms of angina pectoris: Principal | ICD-10-CM

## 2021-04-05 DIAGNOSIS — Z794 Long term (current) use of insulin: Principal | ICD-10-CM

## 2021-04-05 DIAGNOSIS — G4730 Sleep apnea, unspecified: Secondary | ICD-10-CM | POA: Diagnosis not present

## 2021-04-05 DIAGNOSIS — F1111 Opioid abuse, in remission: Secondary | ICD-10-CM | POA: Diagnosis not present

## 2021-04-05 DIAGNOSIS — F1290 Cannabis use, unspecified, uncomplicated: Secondary | ICD-10-CM | POA: Diagnosis not present

## 2021-04-05 DIAGNOSIS — F17210 Nicotine dependence, cigarettes, uncomplicated: Secondary | ICD-10-CM | POA: Diagnosis not present

## 2021-04-05 DIAGNOSIS — R0609 Other forms of dyspnea: Secondary | ICD-10-CM | POA: Diagnosis not present

## 2021-04-05 DIAGNOSIS — Z7982 Long term (current) use of aspirin: Secondary | ICD-10-CM | POA: Diagnosis not present

## 2021-04-05 DIAGNOSIS — R0602 Shortness of breath: Secondary | ICD-10-CM | POA: Diagnosis not present

## 2021-04-05 DIAGNOSIS — E785 Hyperlipidemia, unspecified: Secondary | ICD-10-CM | POA: Diagnosis not present

## 2021-04-05 DIAGNOSIS — Z01818 Encounter for other preprocedural examination: Secondary | ICD-10-CM | POA: Diagnosis not present

## 2021-04-05 DIAGNOSIS — F1090 Alcohol use, unspecified, uncomplicated: Secondary | ICD-10-CM | POA: Diagnosis not present

## 2021-04-05 DIAGNOSIS — R5383 Other fatigue: Secondary | ICD-10-CM | POA: Diagnosis not present

## 2021-04-05 DIAGNOSIS — Z79899 Other long term (current) drug therapy: Secondary | ICD-10-CM | POA: Diagnosis not present

## 2021-04-05 DIAGNOSIS — Z79891 Long term (current) use of opiate analgesic: Secondary | ICD-10-CM | POA: Diagnosis not present

## 2021-04-05 DIAGNOSIS — G4733 Obstructive sleep apnea (adult) (pediatric): Secondary | ICD-10-CM | POA: Diagnosis not present

## 2021-04-05 DIAGNOSIS — I10 Essential (primary) hypertension: Secondary | ICD-10-CM | POA: Diagnosis not present

## 2021-04-05 DIAGNOSIS — E119 Type 2 diabetes mellitus without complications: Secondary | ICD-10-CM | POA: Diagnosis not present

## 2021-04-05 DIAGNOSIS — Z9989 Dependence on other enabling machines and devices: Secondary | ICD-10-CM | POA: Diagnosis not present

## 2021-04-05 DIAGNOSIS — Z7984 Long term (current) use of oral hypoglycemic drugs: Secondary | ICD-10-CM | POA: Diagnosis not present

## 2021-04-05 DIAGNOSIS — Z6835 Body mass index (BMI) 35.0-35.9, adult: Secondary | ICD-10-CM | POA: Diagnosis not present

## 2021-04-05 NOTE — Unmapped (Signed)
Cardiac Surgery Consult Note    Requesting Attending Physician :  Dr. Shon Hale    Consulting Physician: Dr. Cain Sieve    Reason for Consult:  Patient seen at the request of Dr. Hessie Dibble in consultation for coronary artery disease    History of Present Illness:   Bernard Skinner is a 65 y.o. male with pmhx of CAD, diabetes, hyperlipidemia, and tobacco abuse.  Had a history of a left heart cath in 2012 that showed nonobstructive coronary disease.  Over the last 18 months the patient has felt more fatigued and experienced left-sided chest pressure.  He was seen by Dr. Park Breed and  scheduled for a stress test in June 2022, completed the first day of did not realize he was supposed to return the next day.  His symptoms persisted.  He was initially started on isosorbide for management of his symptoms.  This caused a headache and he discontinued it.  He also has shortness of breath that has persisted for the last 6 months.  He has obstructive sleep apnea and uses CPAP every night.  He denies orthopnea.  He underwent a left heart catheterization on 03/24/2021 that showed a chronic total occlusion of the LAD with distal reperfusion and a proximal RCA stenosis.  His right side is nondominant.    Patient denies any history of strokes or mini strokes.  Denies clotting or bleeding disorders.  He does have diabetes and reports his A1c ranges 6-7.  Denies LE claudication symptoms.    He continues to smoke cigarettes, up to a full pack per day.  He had quit smoking for many years but restarted smoking in 2015.  He also endorses drinking 4 ounces of alcohol per day.  He smokes marijuana intermittently.    Allergies:  Statins-hmg-coa reductase inhibitors    Scheduled Meds:  Current Outpatient Medications   Medication Sig Dispense Refill    amLODIPine (NORVASC) 10 MG tablet       aspirin (ECOTRIN) 81 MG tablet Take 81 mg by mouth.      buprenorphine HCL (SUBUTEX) 8 mg tablet 1 tab SL in am, 1 tab SL in afternoon, and 1/2 tab SL qhs 75 tablet 1    cetirizine (ZYRTEC) 10 MG tablet TAKE 1 TABLET BY MOUTH ONCE DAILY 90 tablet 3    coenzyme Q10 100 mg capsule Take 100 mg by mouth.      empagliflozin (JARDIANCE) 10 mg tablet Jardiance 10 mg tablet      evolocumab 140 mg/mL Syrg Inject the contents of one syringe (140 mg) under the skin every fourteen (14) days. 2 mL 11    ezetimibe (ZETIA) 10 mg tablet Take 1 tablet by mouth daily.      flash glucose sensor kit 1 each.      FLUoxetine (PROZAC) 20 MG tablet Take 2 tablets (40 mg total) by mouth daily. 60 tablet 2    fluticasone propionate (FLONASE) 50 mcg/actuation nasal spray 1 spray into each nostril Two (2) times a day. 32 g 11    insulin ASPART (NOVOLOG FLEXPEN) 100 unit/mL (3 mL) injection pen Inject up to 8 units three times daily before meals, as directed      insulin glargine U-300 conc (TOUJEO SOLOSTAR) 300 unit/mL (1.5 mL) injection pen Toujeo SoloStar U-300 Insulin 300 unit/mL (1.5 mL) subcutaneous pen      lisinopriL-hydrochlorothiazide (PRINZIDE,ZESTORETIC) 10-12.5 mg per tablet       meloxicam (MOBIC) 7.5 MG tablet  metFORMIN (GLUCOPHAGE) 1000 MG tablet Take 500 mg by mouth 2 (two) times a day with meals.       naloxone (NARCAN) 4 mg nasal spray One spray in either nostril once for known/suspected opioid overdose. May repeat every 2-3 minutes in alternating nostril til EMS arrives 2 each 0    nitroglycerin (NITROSTAT) 0.3 MG SL tablet PLEASE SEE ATTACHED FOR DETAILED DIRECTIONS      ondansetron (ZOFRAN) 8 MG tablet Take 1 tablet (8 mg total) by mouth Two (2) times a day. 180 tablet 3    pantoprazole (PROTONIX) 40 MG tablet       testosterone cypionate (DEPOTESTOTERONE CYPIONATE) 200 mg/mL injection   0    timolol (TIMOPTIC) 0.5 % ophthalmic solution   4    VASCEPA 1 gram cap TAKE 2 CAPSULES BY MOUTH TWICE A DAY 360 capsule 0    VICTOZA 3-PAK 0.6 mg/0.1 mL (18 mg/3 mL) injection Inject 1.8 mg under the skin daily.   4    vit C/E/Zn/coppr/lutein/zeaxan (PRESERVISION AREDS 2 ORAL) Take 2 tablets by mouth.      zolpidem (AMBIEN) 10 mg tablet 1/2-1 tab po at bedtime prn 30 tablet 0    aspirin (ECOTRIN) 81 MG tablet Take 1 tablet (81 mg total) by mouth daily. 150 tablet 2     No current facility-administered medications for this visit.       Medical History:  Past Medical History:   Diagnosis Date    Coronary artery disease     Diabetes mellitus (CMS-HCC)     Diverticulosis     Hyperlipidemia     statin intolerant    Hypertension     Opioid abuse, in remission (CMS-HCC) 05/23/2018    Opioid use disorder     on subutex , stable as of 12/19     Surgical History:  Past Surgical History:   Procedure Laterality Date    APPENDECTOMY      arthroscopic rotator cuff repair Right 07/27/2013    FRACTURE SURGERY Right     ORIF R tibial plateau fracdture    LEFT COLECTOMY  1977    TONSILLECTOMY       Social History:  Social History     Tobacco Use   Smoking Status Every Day    Packs/day: 0.50    Types: Cigarettes    Start date: 11/09/2003   Smokeless Tobacco Never     Social History     Substance and Sexual Activity   Alcohol Use Yes    Comment: 4 oz daily     Social History     Substance and Sexual Activity   Drug Use Yes    Types: Marijuana    Comment: hx oral opioid abuse in past (CBD 04/18/20)     Living situation: the patient lives with their spouse.    Family History:  The patient's family history includes Alcohol abuse in his father and son; Diabetes in his paternal aunt and son; Drug abuse in his son; Liver disease in his father; Mental illness in his son..    Immediate family history of CAD < 64 years of age: no    Review of Systems: A 12 system review of systems was negative except as noted in HPI.    Physical Exam:  Vitals:    04/05/21 1227   BP: 122/75   Pulse: 84   Temp: 36.5 ??C (97.7 ??F)   SpO2: 94%     General: alert and oriented, resting comfortably in NAD  HEENT: normocephalic, atraumatic. sclera anicteric, MMM  Neck: Supple.  Pulmonary: non-labored breathing, lungs CTAB, No wheezes, rales or rhonchi.   CV: regular rate and rhythm. Normal S1, S2. 2+ DP and radial pulses bilaterally. Lower extremities without edema.  Abdomen/GI: soft, non-tender, non-distended. positive bowel sounds throughout abdomen. no rebound or guarding present.  Neurologic: A&O x 3, answering questions appropriately. Gait steady.  No focal deficit.  Musculoskeletal: extremities warm and well perfused.   Skin: warm and dry. No rashes.    Diagnostic Studies:  LHC 03/24/2021:  CTO of LAD  proximal stenosis of RCA    Assessment/Plan:   Bernard Skinner is a very pleasant 65 y.o. male with past medical history of CAD, DM and sleep apnea.    CAD: Cath images reviewed in clinic.  Patient has shortness of breath and fatigue that is worsened over the past 18 months.  Consideration for coronary vascularization is reasonable.  However, patient has not had a recent transthoracic echocardiogram and this will need to be obtained to properly evaluate this patient.  We will need to determine if he has any valvular pathology and assess left ventricular ejection fraction.  The TTE has been scheduled with Dr. Santo Held office on 04/07/2021.  He is also scheduled for a CTA of his coronaries to evaluate calcium burden and determine if percutaneous intervention to address the chronic total occlusion is feasible.  Surgical vascularization would be an option if his left ventricular ejection fraction is not severely depressed.  We will follow-up with the patient once all these tests have been completed.  The patient and his wife verbalized understanding to the plan of care.    Tobacco use disorder: Smoking cessation was discussed with the patient.  We recommend that he cut back.  He was offered nicotine patches or nicotine gum and he declined at this time.  We will follow-up with him at his next visit.    Dr. Sindy Guadeloupe was present for the clinic visit and endorses the plan of care.    I have seen and evaluated this patient, as well as confirmed the history and physical exam findings.  I have reviewed the relevant lab and diagnostic studies and participated in the evaluation and management decisions for the patient.  I concur with the documentation as outlined.    Cain Sieve, M.D.

## 2021-04-07 DIAGNOSIS — I2510 Atherosclerotic heart disease of native coronary artery without angina pectoris: Secondary | ICD-10-CM | POA: Diagnosis not present

## 2021-04-10 ENCOUNTER — Other Ambulatory Visit: Payer: Self-pay | Admitting: Family Medicine

## 2021-04-10 NOTE — Telephone Encounter (Signed)
Requested Prescriptions  Refused Prescriptions Disp Refills  . pantoprazole (PROTONIX) 40 MG tablet [Pharmacy Med Name: PANTOPRAZOLE SODIUM 40 MG DR TAB] 90 tablet 1    Sig: TAKE 1 TABLET BY MOUTH DAILY     Gastroenterology: Proton Pump Inhibitors Failed - 04/10/2021 11:55 AM      Failed - Valid encounter within last 12 months    Recent Outpatient Visits          1 year ago Essential hypertension   [place], Megan P, DO   2 years ago Pure hypercholesterolemia   [place] [facility], Jeannette How, MD   3 years ago Essential hypertension   [place], Jeannette How, MD   3 years ago Essential hypertension   [place], Jeannette How, MD   4 years ago Pure hypercholesterolemia   [facility], Jeannette How, MD

## 2021-04-13 DIAGNOSIS — I25118 Atherosclerotic heart disease of native coronary artery with other forms of angina pectoris: Principal | ICD-10-CM

## 2021-04-13 MED ORDER — REPATHA SURECLICK 140 MG/ML SUBCUTANEOUS PEN INJECTOR
SUBCUTANEOUS | 3 refills | 0.00000 days | Status: CP
Start: 2021-04-13 — End: ?
  Filled 2021-06-02: qty 6, 84d supply, fill #0

## 2021-04-14 ENCOUNTER — Ambulatory Visit: Admit: 2021-04-14 | Discharge: 2021-04-15 | Payer: PRIVATE HEALTH INSURANCE

## 2021-04-14 DIAGNOSIS — I2582 Chronic total occlusion of coronary artery: Secondary | ICD-10-CM | POA: Diagnosis not present

## 2021-04-14 DIAGNOSIS — I25118 Atherosclerotic heart disease of native coronary artery with other forms of angina pectoris: Secondary | ICD-10-CM | POA: Diagnosis not present

## 2021-04-17 DIAGNOSIS — E291 Testicular hypofunction: Secondary | ICD-10-CM | POA: Diagnosis not present

## 2021-04-17 DIAGNOSIS — E669 Obesity, unspecified: Secondary | ICD-10-CM | POA: Diagnosis not present

## 2021-04-17 DIAGNOSIS — Z5181 Encounter for therapeutic drug level monitoring: Secondary | ICD-10-CM | POA: Diagnosis not present

## 2021-04-17 DIAGNOSIS — E1169 Type 2 diabetes mellitus with other specified complication: Secondary | ICD-10-CM | POA: Diagnosis not present

## 2021-04-19 DIAGNOSIS — E291 Testicular hypofunction: Secondary | ICD-10-CM | POA: Diagnosis not present

## 2021-04-19 DIAGNOSIS — Z794 Long term (current) use of insulin: Secondary | ICD-10-CM | POA: Diagnosis not present

## 2021-04-19 DIAGNOSIS — Z5181 Encounter for therapeutic drug level monitoring: Secondary | ICD-10-CM | POA: Diagnosis not present

## 2021-04-19 DIAGNOSIS — E1169 Type 2 diabetes mellitus with other specified complication: Secondary | ICD-10-CM | POA: Diagnosis not present

## 2021-04-19 DIAGNOSIS — E113493 Type 2 diabetes mellitus with severe nonproliferative diabetic retinopathy without macular edema, bilateral: Secondary | ICD-10-CM | POA: Diagnosis not present

## 2021-04-19 DIAGNOSIS — E669 Obesity, unspecified: Secondary | ICD-10-CM | POA: Diagnosis not present

## 2021-04-20 ENCOUNTER — Ambulatory Visit
Admit: 2021-04-20 | Discharge: 2021-04-21 | Payer: PRIVATE HEALTH INSURANCE | Attending: Cardiovascular Disease | Primary: Cardiovascular Disease

## 2021-04-20 DIAGNOSIS — R0609 Other forms of dyspnea: Principal | ICD-10-CM

## 2021-04-20 DIAGNOSIS — I25118 Atherosclerotic heart disease of native coronary artery with other forms of angina pectoris: Principal | ICD-10-CM

## 2021-04-20 DIAGNOSIS — I2510 Atherosclerotic heart disease of native coronary artery without angina pectoris: Principal | ICD-10-CM

## 2021-04-20 DIAGNOSIS — Z01818 Encounter for other preprocedural examination: Principal | ICD-10-CM

## 2021-04-20 DIAGNOSIS — Z7984 Long term (current) use of oral hypoglycemic drugs: Secondary | ICD-10-CM | POA: Diagnosis not present

## 2021-04-20 DIAGNOSIS — E785 Hyperlipidemia, unspecified: Secondary | ICD-10-CM | POA: Diagnosis not present

## 2021-04-20 DIAGNOSIS — E119 Type 2 diabetes mellitus without complications: Secondary | ICD-10-CM | POA: Diagnosis not present

## 2021-04-20 DIAGNOSIS — F411 Generalized anxiety disorder: Secondary | ICD-10-CM | POA: Diagnosis not present

## 2021-04-20 DIAGNOSIS — Z794 Long term (current) use of insulin: Secondary | ICD-10-CM | POA: Diagnosis not present

## 2021-04-20 DIAGNOSIS — G4700 Insomnia, unspecified: Secondary | ICD-10-CM | POA: Diagnosis not present

## 2021-04-20 DIAGNOSIS — I10 Essential (primary) hypertension: Secondary | ICD-10-CM | POA: Diagnosis not present

## 2021-04-20 DIAGNOSIS — G4733 Obstructive sleep apnea (adult) (pediatric): Secondary | ICD-10-CM | POA: Diagnosis not present

## 2021-04-20 DIAGNOSIS — Z7982 Long term (current) use of aspirin: Secondary | ICD-10-CM | POA: Diagnosis not present

## 2021-04-20 DIAGNOSIS — Z6835 Body mass index (BMI) 35.0-35.9, adult: Secondary | ICD-10-CM | POA: Diagnosis not present

## 2021-04-20 DIAGNOSIS — E113293 Type 2 diabetes mellitus with mild nonproliferative diabetic retinopathy without macular edema, bilateral: Secondary | ICD-10-CM | POA: Diagnosis not present

## 2021-04-20 DIAGNOSIS — Z79899 Other long term (current) drug therapy: Secondary | ICD-10-CM | POA: Diagnosis not present

## 2021-04-20 DIAGNOSIS — Z87891 Personal history of nicotine dependence: Secondary | ICD-10-CM | POA: Diagnosis not present

## 2021-04-20 NOTE — Unmapped (Signed)
DIVISION OF CARDIOLOGY   [facility] [place]        Date of Service: 04/20/2021      PCP: Referring Provider:   Arlyss Gandy, MD  [address]  [place] [address]  Phone: [phone]  Fax: [phone] Rosana Hoes, MD  [id number]  [place],  [place] [address]  Phone: [phone]  Fax: [phone]       Impression and Plan:  Bernard Skinner is a 65 y.o. male with a history of GAD, depression, and opioid use disorder, referred to the cardiology clinic at the [place] [facility] by Lucille Passy, MD for evaluation of CAD with possible CTO intervention.     1. CAD with CTO of the mid LAD -     We discussed the risk benefits indications of moving forward with PCI of the CTO and he wants to be aggressive with regards to revascularization.  I discussed with him that PCI is reasonable because he does have clear ischemia in that territory although he is not clearly having symptoms of angina.  I suspect that his exertional fatigue and dyspnea is an anginal equivalent however.  It is hard to know how much better he will feel with PCI and I did relay to him the possibility that we could complete PCI and his symptoms may not improve.  It will be important for him to continue to work on nutrition and weight loss and management of his obstructive sleep apnea.  He is going to quit smoking and moderate his alcohol consumption.  We discussed the risk and benefits of CTO PCI including the risk of perforation, vessel disruption and need for emergency cardiac surgery.  He understands that there is a risk of complications with the procedure and he is very anxious to proceed.    Elpidio Anis MD [facility]  [facility]   Division of Cardiology  Pager [phone]    History of Present Illness:  Bernard Skinner is a 65 y.o. male with a history of GAD, depression, and opioid use disorder referred to the cardiology clinic at the [place] [facility] by Lucille Passy, MD for evaluation of CAD with possible CTO intervention.     Past medical history is notable for longstanding tobacco abuse, obesity, and type 2 diabetes.    He states he feels SOB and fatigue.  He notes that this is been present for at least the past year if not longer.  He states that he has significant decline in his energy level to the point where he has difficulties doing some of his activities of daily living.  He does not have any significant exertional angina but he does note that he occasionally feels a fluttering sensation in his chest.  This is occasionally accompanied by a constant dull pain which lasts 30 to 40 seconds.  He was evaluated with a stress test and subsequent cardiac catheterization. Bernard Skinner recently underwent cardiac catheterization at [facility] on 03/24/21 by Dr. Milta Deiters.  He was found to have a chronically occluded mid LAD, with LCX giving collaterals, and 80% proximal non-dominant RCA. LVEF 60% on echo. He was referred for consideration of CTO PCI of LAD at [facility].    Bernard Skinner presents for evaluation of CAD.   He feels generalized fatigue for the last year. No chest pain, but does have dyspnea with heavy activity.  No pnd/orthopnea or edema. He is very clear that his exertional fatigue has been markedly worse over the last 6 months and he very much wants to move forward with attempted PCI of his recently diagnosed LAD occlusion.       Past Medical History:  1. GAD  2. Depression  3. Opioid use disorder  4.  Tobacco abuse  5.  Type 2 diabetes  6.  Hyperlipidemia  7.  Morbid obesity  8.  Coronary artery disease with occluded LAD    Past Medical History:  Past Medical History:   Diagnosis Date   ??? Coronary artery disease    ??? Diabetes mellitus (CMS-HCC)    ??? Diverticulosis    ??? Hyperlipidemia     statin intolerant   ??? Hypertension    ??? Opioid abuse, in remission (CMS-HCC) 05/23/2018   ??? Opioid use disorder     on subutex , stable as of 12/19       Past Surgical History:  Past Surgical History:   Procedure Laterality Date   ??? APPENDECTOMY     ??? arthroscopic rotator cuff repair Right 07/27/2013   ??? FRACTURE SURGERY Right     ORIF R tibial plateau fracdture   ??? LEFT COLECTOMY  1977   ??? TONSILLECTOMY         Problem List:  Patient Active Problem List   Diagnosis   ??? Back pain at L4-L5 level   ??? CAD (coronary artery disease)   ??? Chronic knee pain   ??? Derangement of medial meniscus   ??? Essential hypertension   ??? Hyperlipidemia   ??? Hypogonadism in male   ??? Erectile dysfunction   ??? Long-term insulin use (CMS-HCC)   ??? S/P rotator cuff repair   ??? Sleep apnea   ??? Type 2 diabetes mellitus with mild nonproliferative retinopathy of both eyes, with long-term current use of insulin (CMS-HCC)   ??? Opioid use disorder   ??? Adenomatous polyp of colon   ??? Hyperprolactinemia (CMS-HCC)   ??? Eye disorder   ??? Smoker   ??? Screening for colon cancer   ??? Routine general medical examination at a health care facility   ??? PND (post-nasal drip)   ??? Cough   ??? Morbid obesity (CMS-HCC)   ??? Insomnia   ??? GAD (generalized anxiety disorder)   ??? Chronic nausea   ??? Nonintractable headache   ??? Rhinorrhea   ??? 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 (CMS-HCC)   ??? Sputum production   ??? Disorder of rotator cuff syndrome of left shoulder and allied disorder   ??? Gross hematuria   ??? Fatigue   ??? Dyspnea on exertion       Most Recent Imaging:    LHC  03/24/21 @ [facility]  ??? ??Prox RCA lesion is 80% stenosed.   ??? ??Mid LAD lesion is 100% stenosed.   ??? ??The left ventricular systolic function is normal.   ??? ??LV end diastolic pressure is normal.   Chronically occluded mid LAD, with LCX giving collaterals, and 80%   proximal co-dominant RCA, LVEF 60% on echo. Advise [place] to treat CTO of LAD    Echo  03/24/21 @ [facility]  LVEF 60% on echo.    PVL  No prior.    ECG  No prior.     Current Medications:  Current Outpatient Medications   Medication Sig Dispense Refill   ??? amLODIPine (NORVASC) 10 MG tablet      ???  aspirin (ECOTRIN) 81 MG tablet Take 81 mg by mouth.     ??? cetirizine (ZYRTEC) 10 MG tablet TAKE 1 TABLET BY MOUTH ONCE DAILY 90 tablet 3   ??? coenzyme Q10 100 mg capsule Take 100 mg by mouth.     ??? empagliflozin (JARDIANCE) 10 mg tablet Jardiance 10 mg tablet     ??? evolocumab (REPATHA SURECLICK) 140 mg/mL PnIj Inject the contents of one pen (140 mg) under the skin every fourteen (14) days. (Patient taking differently: Inject 140 mg under the skin every fourteen (14) days. Has not received yet 04/20/21-not taking) 6 mL 3   ??? ezetimibe (ZETIA) 10 mg tablet Take 1 tablet by mouth daily.     ??? flash glucose sensor kit 1 each.     ??? FLUoxetine (PROZAC) 20 MG tablet Take 2 tablets (40 mg total) by mouth daily. 60 tablet 2   ??? fluticasone propionate (FLONASE) 50 mcg/actuation nasal spray 1 spray into each nostril Two (2) times a day. 32 g 11   ??? insulin ASPART (NOVOLOG FLEXPEN) 100 unit/mL (3 mL) injection pen Inject up to 8 units three times daily before meals, as directed     ??? insulin glargine U-300 conc (TOUJEO SOLOSTAR) 300 unit/mL (1.5 mL) injection pen Toujeo SoloStar U-300 Insulin 300 unit/mL (1.5 mL) subcutaneous pen     ??? lisinopriL-hydrochlorothiazide (PRINZIDE,ZESTORETIC) 10-12.5 mg per tablet      ??? meloxicam (MOBIC) 7.5 MG tablet      ??? metFORMIN (GLUCOPHAGE) 1000 MG tablet Take 500 mg by mouth 2 (two) times a day with meals.      ??? naloxone (NARCAN) 4 mg nasal spray One spray in either nostril once for known/suspected opioid overdose. May repeat every 2-3 minutes in alternating nostril til EMS arrives 2 each 0   ??? nitroglycerin (NITROSTAT) 0.3 MG SL tablet PLEASE SEE ATTACHED FOR DETAILED DIRECTIONS     ??? ondansetron (ZOFRAN) 8 MG tablet Take 1 tablet (8 mg total) by mouth Two (2) times a day. 180 tablet 3   ??? pantoprazole (PROTONIX) 40 MG tablet      ??? testosterone cypionate (DEPOTESTOTERONE CYPIONATE) 200 mg/mL injection   0   ??? timolol (TIMOPTIC) 0.5 % ophthalmic solution   4   ??? VASCEPA 1 gram cap TAKE 2 CAPSULES BY MOUTH TWICE A DAY 360 capsule 0   ??? VICTOZA 3-PAK 0.6 mg/0.1 mL (18 mg/3 mL) injection Inject 1.8 mg under the skin daily.   4   ??? vit C/E/Zn/coppr/lutein/zeaxan (PRESERVISION AREDS 2 ORAL) Take 2 tablets by mouth.     ??? zolpidem (AMBIEN) 10 mg tablet 1/2-1 tab po at bedtime prn 30 tablet 0   ??? aspirin (ECOTRIN) 81 MG tablet Take 1 tablet (81 mg total) by mouth daily. 150 tablet 2     No current facility-administered medications for this visit.       Allergies:  Allergies   Allergen Reactions   ??? Statins-Hmg-Coa Reductase Inhibitors      Nausea w/rosuva and simva       Review Of Systems:  Twelve system review of systems was completed and was non-contributory except as noted above, in the history of present illness.    Family History:  His family history includes Alcohol abuse in his father and son; Diabetes in his paternal aunt and son; Drug abuse in his son; Liver disease in his father; Mental illness in his son.    Social History:  He  reports that he has been smoking  cigarettes. He started smoking about 17 years ago. He has been smoking an average of .5 packs per day. He has never used smokeless tobacco. He reports current alcohol use. He reports current drug use. Drug: Marijuana.  Social History     Tobacco Use   Smoking Status Every Day   ??? Packs/day: 0.50   ??? Types: Cigarettes   ??? Start date: 11/09/2003   Smokeless Tobacco Never     Social History     Substance and Sexual Activity   Alcohol Use Yes    Comment: 4 oz daily     Social History     Substance and Sexual Activity   Drug Use Yes   ??? Types: Marijuana    Comment: hx oral opioid abuse in past (CBD 04/18/20)       Physical Exam:    BP 136/69 (BP Site: L Arm, BP Position: Sitting, BP Cuff Size: Medium) Comment: notes 117/72 yesterday - Pulse 66  - Ht 175.3 cm (5' 9)  - Wt (!) 109.8 kg (242 lb)  - SpO2 96%  - BMI 35.74 kg/m??     Wt Readings from Last 3 Encounters:   04/20/21 (!) 109.8 kg (242 lb)   04/05/21 (!) 108.3 kg (238 lb 12.1 oz)   03/29/21 (!) 109.4 kg (241 lb 3.2 oz)       General Appearance:  Alert, cooperative, no distress, appears stated age     HEENT:  Unremarkable   Neck: Carotids 2+ bilaterally, no carotid bruits,  No JVD, no HJR.  Central venous pressure is normal.        Lungs:   Clear to auscultation bilaterally, respirations unlabored     Heart:  Regular rate and rhythm  Normal S1 and S2   No murmurs/rubs/gallops     Abdomen:   Soft, non-tender, bowel sounds active,  no masses, no organomegaly     Extremities: Extremities normal, no cyanosis or edema.     Pulses: PT's 2+ symmetric bilaterally     Skin: No lower extremity rashes or ulcers     Neurologic: Alert, interactive, and appropriate, grossly moving all 4 extremities        LABS:     No results found for: PROBNP    Lab Results   Component Value Date    HGB 14.5 07/03/2018       Lab Results   Component Value Date    K 4.2 11/04/2017    CREATININE 1.1 04/14/2021       No results found for: TSH, ALBUMIN, ALT, AST, INR      Scribe Attestation:         This document serves as a record of the services and decisions performed by Elpidio Anis, MD on 04/20/2021. It was created on his behalf by Michaele Offer, a trained medical scribe. The creation of this document is based on the provider's statements and observations that were conveyed to the medical scribe during the patient's encounter.     (The information in this document, created by the medical scribe for me, accurately reflects the services I personally performed and the decisions made by me. I have reviewed and approved this document for accuracy.)     Elpidio Anis  MD [facility]  Division of Cardiology   [place] [facility]   Office [phone]   Pager [phone]

## 2021-04-21 DIAGNOSIS — I25118 Atherosclerotic heart disease of native coronary artery with other forms of angina pectoris: Secondary | ICD-10-CM | POA: Diagnosis not present

## 2021-04-21 DIAGNOSIS — R0609 Other forms of dyspnea: Secondary | ICD-10-CM | POA: Diagnosis not present

## 2021-04-24 DIAGNOSIS — I25118 Atherosclerotic heart disease of native coronary artery with other forms of angina pectoris: Principal | ICD-10-CM

## 2021-04-24 DIAGNOSIS — I10 Essential (primary) hypertension: Principal | ICD-10-CM

## 2021-04-24 DIAGNOSIS — R0609 Other forms of dyspnea: Principal | ICD-10-CM

## 2021-04-25 NOTE — Unmapped (Signed)
[facility] Specialty Medication Onboarding    Specialty Medication: REPATHA SURECLICK 140 mg/mL Pnij (evolocumab)  Prior Authorization: Approved   Financial Assistance: No - patient doesn't qualify for additional assistance   Final Copay/Day Supply: $72.04 / 64     Insurance Restrictions: None     Notes to Pharmacist:     The triage team has completed the benefits investigation and has determined that the patient is able to fill this medication at [facility]. Please contact the patient to complete the onboarding or follow up with the prescribing physician as needed.

## 2021-04-25 NOTE — Unmapped (Signed)
[facility]   Patient Onboarding/Medication Counseling    Bernard Skinner is a 65 y.o. male with CAD who I am counseling today on initiation of therapy.  I am speaking to the patient.    Was a Nurse, learning disability used for this call? No    Verified patient's date of birth / HIPAA.    Specialty medication(s) to be sent: General Specialty: Repatha      Non-specialty medications/supplies to be sent: None at this time       Medications not needed at this time: n/a         Repatha (evolocumab)    Medication & Administration     Dosage: Inject the contents of 1 pen (140mg ) under the skin every 2 weeks.    Administration: Administer under the skin of the abdomen, thigh or upper arm. Rotate sites with each injection.  ??? Injection instructions - Autoinjector:  o Remove 1 Repatha autoinjector from the refrigerator and let stand at room temperature for at least 30 minutes.  o Check the autoinjector for the following:  - Expiration date  - Absence of any cracks or damage  - The medicine is clear and colorless and does not contain any particles  - The orange cap is present and securely attached  o Choose your injection site and clean with an alcohol wipe. Allow to air dry completely.  o Pull the orange cap straight off and discard  o Pinch the skin (or stretch) with your thumb and fingers creating an area 2 inches wide  o Maintaining the pinch (or stretch) press the pen to your skin at a 90 degree angle. Firmly push the autoinjector down until the skin stops moving and the yellow safety guard is no longer visible.  o Do not touch the gray start button yet  o When you are ready to inject, press the gray start button. You will hear a click that signals the start of the injection  o Continue to press the pen to your skin and lift your thumb  o The injection may take up to 15 seconds. You will know the injection is complete when the medication window turns yellow. You may also hear a second click.  o Remove the pen from your skin and discard the pen in a sharps container.  o If there is blood at the injection site, press a cotton ball or gauze to the site. Do not rub the injection site.    Adherence/Missed dose instructions: Administer a missed dose within 7 days and resume your normal schedule.  If it has been more than 7 days and you inject every 2 weeks, skip the missed dose and resume your normal schedule..     Goals of Therapy     Lower cholesterol, prevention of cardiovascular events in patients with established cardiovascular disease    Side Effects & Monitoring Parameters   ??? Flu-like symptoms  ??? Signs of a common cold  ??? Back pain  ??? Injection site irritation  ??? Nose or throat irritation    The following side effects should be reported to the provider:  ??? Signs of an allergic reaction  ??? Signs of high blood sugar (confusion, drowsiness, increase thirst/hunger/urination, fast breathing, flushing)      Contraindications, Warnings, & Precautions     ??? Latex (the packaging of Repatha may contain natural rubber)    Drug/Food Interactions     ??? Medication list reviewed in Epic. The patient was instructed to  inform the care team before taking any new medications or supplements. No drug interactions identified.     Storage, Handling Precautions, & Disposal   ??? Repatha should be stored in the refrigerator. If necessary, Repatha may be kept at room temperature for no more than 30 days.  ??? Place used devices in a sharps container for disposal.      Current Medications (including OTC/herbals), Comorbidities and Allergies     Current Outpatient Medications   Medication Sig Dispense Refill   ??? amLODIPine (NORVASC) 10 MG tablet      ??? aspirin (ECOTRIN) 81 MG tablet Take 1 tablet (81 mg total) by mouth daily. 150 tablet 2   ??? aspirin (ECOTRIN) 81 MG tablet Take 81 mg by mouth.     ??? cetirizine (ZYRTEC) 10 MG tablet TAKE 1 TABLET BY MOUTH ONCE DAILY 90 tablet 3   ??? coenzyme Q10 100 mg capsule Take 100 mg by mouth.     ??? empagliflozin (JARDIANCE) 10 mg tablet Jardiance 10 mg tablet     ??? evolocumab (REPATHA SURECLICK) 140 mg/mL PnIj Inject the contents of one pen (140 mg) under the skin every fourteen (14) days. (Patient taking differently: Inject 140 mg under the skin every fourteen (14) days. Has not received yet 04/20/21-not taking) 6 mL 3   ??? ezetimibe (ZETIA) 10 mg tablet Take 1 tablet by mouth daily.     ??? flash glucose sensor kit 1 each.     ??? FLUoxetine (PROZAC) 20 MG tablet Take 2 tablets (40 mg total) by mouth daily. 60 tablet 2   ??? fluticasone propionate (FLONASE) 50 mcg/actuation nasal spray 1 spray into each nostril Two (2) times a day. 32 g 11   ??? insulin ASPART (NOVOLOG FLEXPEN) 100 unit/mL (3 mL) injection pen Inject up to 8 units three times daily before meals, as directed     ??? insulin glargine U-300 conc (TOUJEO SOLOSTAR) 300 unit/mL (1.5 mL) injection pen Toujeo SoloStar U-300 Insulin 300 unit/mL (1.5 mL) subcutaneous pen     ??? lisinopriL-hydrochlorothiazide (PRINZIDE,ZESTORETIC) 10-12.5 mg per tablet      ??? meloxicam (MOBIC) 7.5 MG tablet      ??? metFORMIN (GLUCOPHAGE) 1000 MG tablet Take 500 mg by mouth 2 (two) times a day with meals.      ??? naloxone (NARCAN) 4 mg nasal spray One spray in either nostril once for known/suspected opioid overdose. May repeat every 2-3 minutes in alternating nostril til EMS arrives 2 each 0   ??? nitroglycerin (NITROSTAT) 0.3 MG SL tablet PLEASE SEE ATTACHED FOR DETAILED DIRECTIONS     ??? ondansetron (ZOFRAN) 8 MG tablet Take 1 tablet (8 mg total) by mouth Two (2) times a day. 180 tablet 3   ??? pantoprazole (PROTONIX) 40 MG tablet      ??? testosterone cypionate (DEPOTESTOTERONE CYPIONATE) 200 mg/mL injection   0   ??? timolol (TIMOPTIC) 0.5 % ophthalmic solution   4   ??? VASCEPA 1 gram cap TAKE 2 CAPSULES BY MOUTH TWICE A DAY 360 capsule 0   ??? VICTOZA 3-PAK 0.6 mg/0.1 mL (18 mg/3 mL) injection Inject 1.8 mg under the skin daily.   4   ??? vit C/E/Zn/coppr/lutein/zeaxan (PRESERVISION AREDS 2 ORAL) Take 2 tablets by mouth.     ??? zolpidem (AMBIEN) 10 mg tablet 1/2-1 tab po at bedtime prn 30 tablet 0     No current facility-administered medications for this visit.       Allergies   Allergen Reactions   ???  Statins-Hmg-Coa Reductase Inhibitors      Nausea w/rosuva and simva       Patient Active Problem List   Diagnosis   ??? Back pain at L4-L5 level   ??? CAD (coronary artery disease)   ??? Chronic knee pain   ??? Derangement of medial meniscus   ??? Essential hypertension   ??? Hyperlipidemia   ??? Hypogonadism in male   ??? Erectile dysfunction   ??? Long-term insulin use (CMS-HCC)   ??? S/P rotator cuff repair   ??? Sleep apnea   ??? Type 2 diabetes mellitus with mild nonproliferative retinopathy of both eyes, with long-term current use of insulin (CMS-HCC)   ??? Opioid use disorder   ??? Adenomatous polyp of colon   ??? Hyperprolactinemia (CMS-HCC)   ??? Eye disorder   ??? Smoker   ??? Screening for colon cancer   ??? Routine general medical examination at a health care facility   ??? PND (post-nasal drip)   ??? Cough   ??? Morbid obesity (CMS-HCC)   ??? Insomnia   ??? GAD (generalized anxiety disorder)   ??? Chronic nausea   ??? Nonintractable headache   ??? Rhinorrhea   ??? 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 (CMS-HCC)   ??? Sputum production   ??? Disorder of rotator cuff syndrome of left shoulder and allied disorder   ??? Gross hematuria   ??? Fatigue   ??? Dyspnea on exertion       Reviewed and up to date in Epic.    Appropriateness of Therapy     Acute infections noted within Epic:  No active infections  Patient reported infection: None    Is medication and dose appropriate based on diagnosis and infection status? Yes    Prescription has been clinically reviewed: Yes      Baseline Quality of Life Assessment      How many days over the past month did your CAD  keep you from your normal activities? For example, brushing your teeth or getting up in the morning. Patient declined to answer    Financial Information Medication Assistance provided: Prior Authorization    Anticipated copay of $90 (84 days) reviewed with patient. Verified delivery address.    Delivery Information     Scheduled delivery date: 06/02/21    Expected start date: 06/02/21    Medication will be delivered via Same Day Courier to the prescription address in [facility].  This shipment will not require a signature.      Explained the services we provide at [facility] and that each month we would call to set up refills.  Stressed importance of returning phone calls so that we could ensure they receive their medications in time each month.  Informed patient that we should be setting up refills 7-10 days prior to when they will run out of medication.  A pharmacist will reach out to perform a clinical assessment periodically.  Informed patient that a welcome packet, containing information about our pharmacy and other support services, a Notice of Privacy Practices, and a drug information handout will be sent.      The patient or caregiver noted above participated in the development of this care plan and knows that they can request review of or adjustments to the care plan at any time.      Patient or caregiver verbalized understanding of the above information as well as how to contact the pharmacy at [phone] option  4 with any questions/concerns.  The pharmacy is open Monday through Friday 8:30am-4:30pm.  A pharmacist is available 24/7 via pager to answer any clinical questions they may have.    Patient Specific Needs     - Does the patient have any physical, cognitive, or cultural barriers? No    - Does the patient have adequate living arrangements? (i.e. the ability to store and take their medication appropriately) Yes    - Did you identify any home environmental safety or security hazards? No    - Patient prefers to have medications discussed with  Patient     - Is the patient or caregiver able to read and understand education materials at a high school level or above? Yes    - Patient's primary language is  English     - Is the patient high risk? No    SOCIAL DETERMINANTS OF HEALTH     At the [facility], we have learned that life circumstances - like trouble affording food, housing, utilities, or transportation can affect the health of many of our patients.   That is why we wanted to ask: are you currently experiencing any life circumstances that are negatively impacting your health and/or quality of life? Patient declined to answer    Social Determinants of Health     Food Insecurity: Not on file   Tobacco Use: High Risk   ??? Smoking Tobacco Use: Every Day   ??? Smokeless Tobacco Use: Never   ??? Passive Exposure: Not on file   Transportation Needs: Not on file   Alcohol Use: Not on file   Housing/Utilities: Not on file   Substance Use: Not on file   Financial Resource Strain: Not on file   Physical Activity: Not on file   Health Literacy: High Risk   ??? : Often   Stress: Not on file   Intimate Partner Violence: Not on file   Depression: Not at risk   ??? PHQ-2 Score: 0   Social Connections: Not on file       Would you be willing to receive help with any of the needs that you have identified today? Not applicable       Camillo Flaming  [facility] Specialty Pharmacist

## 2021-05-03 ENCOUNTER — Ambulatory Visit: Admit: 2021-05-03 | Discharge: 2021-05-03 | Payer: PRIVATE HEALTH INSURANCE

## 2021-05-03 HISTORY — PX: CORONARY ANGIOPLASTY WITH STENT PLACEMENT: SHX49

## 2021-05-03 LAB — BASIC METABOLIC PANEL
ANION GAP: 5 mmol/L (ref 5–14)
BLOOD UREA NITROGEN: 25 mg/dL — ABNORMAL HIGH (ref 9–23)
BUN / CREAT RATIO: 27
CALCIUM: 9.6 mg/dL (ref 8.7–10.4)
CHLORIDE: 105 mmol/L (ref 98–107)
CO2: 30 mmol/L (ref 20.0–31.0)
CREATININE: 0.91 mg/dL
EGFR CKD-EPI (2021) MALE: >90 mL/min/{1.73_m2} (ref >=60)
GLUCOSE RANDOM: 143 mg/dL — ABNORMAL HIGH (ref 70–99)
POTASSIUM: 4.2 mmol/L (ref 3.4–4.8)
SODIUM: 140 mmol/L (ref 135–145)

## 2021-05-03 LAB — CBC W/ AUTO DIFF
BASOPHILS ABSOLUTE COUNT: 0.1 10*9/L (ref 0.0–0.1)
BASOPHILS RELATIVE PERCENT: 0.9 %
EOSINOPHILS ABSOLUTE COUNT: 0.3 10*9/L (ref 0.0–0.5)
EOSINOPHILS RELATIVE PERCENT: 3.8 %
HEMATOCRIT: 45.6 % (ref 39.0–48.0)
HEMOGLOBIN: 15.4 g/dL (ref 12.9–16.5)
LYMPHOCYTES ABSOLUTE COUNT: 1.8 10*9/L (ref 1.1–3.6)
LYMPHOCYTES RELATIVE PERCENT: 24.6 %
MEAN CORPUSCULAR HEMOGLOBIN CONC: 33.8 g/dL (ref 32.0–36.0)
MEAN CORPUSCULAR HEMOGLOBIN: 31 pg (ref 25.9–32.4)
MEAN CORPUSCULAR VOLUME: 91.6 fL (ref 77.6–95.7)
MEAN PLATELET VOLUME: 6.9 fL (ref 6.8–10.7)
MONOCYTES ABSOLUTE COUNT: 0.7 10*9/L (ref 0.3–0.8)
MONOCYTES RELATIVE PERCENT: 10.2 %
NEUTROPHILS ABSOLUTE COUNT: 4.4 10*9/L (ref 1.8–7.8)
NEUTROPHILS RELATIVE PERCENT: 60.5 %
PLATELET COUNT: 261 10*9/L (ref 150–450)
RED BLOOD CELL COUNT: 4.98 10*12/L (ref 4.26–5.60)
RED CELL DISTRIBUTION WIDTH: 13.4 % (ref 12.2–15.2)
WBC ADJUSTED: 7.3 10*9/L (ref 3.6–11.2)

## 2021-05-03 MED ORDER — ASPIRIN 81 MG TABLET,DELAYED RELEASE
ORAL_TABLET | Freq: Every day | ORAL | 11 refills | 30 days | Status: CP
Start: 2021-05-03 — End: ?
  Filled 2021-05-03: qty 30, 30d supply, fill #0

## 2021-05-03 MED ADMIN — nitroGLYCERIN 12 mg/120 mL (100 mcg/mL) in heparinized saline: INTRACORONARY | @ 14:00:00 | Stop: 2021-05-03

## 2021-05-03 MED ADMIN — lidocaine (PF) (XYLOCAINE-MPF) 20 mg/mL (2 %) injection: SUBCUTANEOUS | @ 13:00:00 | Stop: 2021-05-03

## 2021-05-03 MED ADMIN — heparin (porcine) 1000 unit/mL injection: INTRAVENOUS | @ 13:00:00 | Stop: 2021-05-03

## 2021-05-03 MED ADMIN — heparin (porcine) in NS Manifold Flush: @ 13:00:00 | Stop: 2021-05-03

## 2021-05-03 MED ADMIN — iohexoL (OMNIPAQUE) 300 mg iodine/mL solution: INTRACORONARY | @ 14:00:00 | Stop: 2021-05-03

## 2021-05-03 MED ADMIN — clopidogreL (PLAVIX) tablet 600 mg: 600 mg | ORAL | @ 13:00:00 | Stop: 2021-05-03

## 2021-05-03 MED ADMIN — fentaNYL (PF) (SUBLIMAZE) injection: INTRAVENOUS | @ 13:00:00 | Stop: 2021-05-03

## 2021-05-03 MED ADMIN — aspirin chewable tablet 324 mg: 324 mg | ORAL | @ 13:00:00 | Stop: 2021-05-03

## 2021-05-03 MED ADMIN — heparin (porcine) 1000 unit/mL injection: INTRAVENOUS | @ 14:00:00 | Stop: 2021-05-03

## 2021-05-03 MED ADMIN — midazolam (VERSED) injection: INTRAVENOUS | @ 13:00:00 | Stop: 2021-05-03

## 2021-05-03 MED ADMIN — sodium chloride (NS) 0.9 % infusion: INTRAVENOUS | @ 14:00:00 | Stop: 2021-05-03

## 2021-05-03 MED ADMIN — sodium chloride (NS) 0.9 % infusion 100 mL 100 mL: 100 mL | INTRAVENOUS | @ 15:00:00 | Stop: 2021-05-03

## 2021-05-03 NOTE — Unmapped (Incomplete)
Cardiac Catheterization Laboratory  [facility], [place]  Tel: [phone]   Fax: [phone]    CARDIAC CATHETERIZATION REPORT    Date of Procedure: 05/03/2021     ____________________________________________________________________________  Findings:  1. Coronary artery disease including chronic total occlusion of the mid LAD.  2. Successful IVUS guided CTO intervention of the mid LAD placement of a 2.5 x 32 mm Synergy DES and a 2.5 x 28 mm Synergy DES with excellent angiographic result and TIMI 3 flow.    Recommendations:  1. Aggressive secondary prevention.  2. Dual antiplatelet therapy with aspirin and Clopidogrel for at least 6 months, ideally longer.  3. Follow up with primary cardiologist.    Complications:  ?? None  ____________________________________________________________________________  Referring Physician: Eppie Gibson, MD  Primary Cardiologist: Milta Deiters, MD  Primary Care Provider: Arlyss Gandy, MD    Performing Attending: Hoy Finlay, MD  Interventional Fellow: Lourdes Sledge, MD    Procedures performed:  Coronary Angiography  Intravascular Ultrasound (IVUS)  Percutaneous Coronary Intervention (PCI)    Access Site: Right Femoral Artery  Arterial Closure: Angioseal  Contrast Volume: 160 mL  Fluoroscopy Time: *** mins  Radiation Dose: *** mGy    ____________________________________________________________________________  History  ***    Procedure    Left Heart Catheterization (***Femoral)  Under Lidocaine 2% local anesthesia, a 31F sheath was placed in the right femoral artery using modified Seldinger technique. ***Left heart catheterization was performed by advancing a 31F Angled Pigtail catheter across the aortic valve. The LVEDP was obtained. A left ventriculogram was performed by power injection through the Angled Pigtail catheter with Omnipaque. Selective coronary angiography was then performed in multiple views by hand injections of Omnipaque using a 31F JL4 catheter to engage the left coronary artery and a 31F JR4 catheter to engage the right coronary artery.    Following the procedure, the sheath was removed and manual compression was applied to achieve hemostasis. The patient tolerated the procedure well without any complications.    Left Heart Catheterization (***Radial)  Under Lidocaine 2% local anesthesia, a 31F Terumo Glidesheath was placed in the right radial artery using modified Seldinger technique. 3 mg of verapamil were given via the sheath intra-arterially. A 0.035 Rosen wire (260cm) was used to guide the advancement of the coronary catheter for engagement. ***000 units of heparin were given after the wire and catheter crossed the aortic arch into the ascending aorta. ***Left heart catheterization was performed by advancing a 31F JR4 catheter across the aortic valve. The LVEDP was obtained. A left ventriculogram was performed by hand injection of Omnipaque using the 31F JR4 catheter. Selective coronary angiography was then performed in multiple views by hand injections of Omnipaque using a 31F JR4 catheter to engage the right coronary artery and a 31F JL3.5 catheter to engage the left coronary artery.    Following the procedure, 2 mg of verapamil were given via the sheath intra-arterially. The sheath was removed, and a Vasc band was applied to achieve hemostasis. The Vasc band was subsequently removed with staged removal of air as per protocol. The patient tolerated the procedure well without any complications.    Percutaneous Coronary Intervention  The decision was made to intervene on the ***.    PCI to ***    ?? Anticoagulation: Heparin   ?? Antiplatelet: ***   ?? Guide: 31F ***   ?? Guidewire(s): ***   ?? Intracoronary imaging: ***      ?? Pre-dilation balloon(s): ***   ??  Stent(s): ***   ?? Post-dilation balloon(s): ***   ?? Result: Excellent angiographic result with TIMI 3 flow.      PCI to ***    ?? Anticoagulation: Heparin   ?? Antiplatelet: ***   ?? Guide: 45F *** ?? Guidewire(s): ***   ?? Intracoronary imaging: ***      ?? Pre-dilation balloon(s): ***   ?? Stent(s): ***   ?? Post-dilation balloon(s): ***   ?? Result: Excellent angiographic result with TIMI 3 flow.      ***Following the procedure, the sheath was sutured in place. Once the ACT was < 140, the sheath was removed and manual compression was applied to achieve hemostasis. The patient tolerated the procedure well without any complications.    ***Following the procedure, 2 mg of verapamil were given via the sheath intra-arterially. The sheath was removed, and a Vasc band was applied to achieve hemostasis. The Vasc band was subsequently removed with staged removal of air as per protocol. The patient tolerated the procedure well without any complications.    Fractional Flow Reserve (FFR)  The decision was made to [facility] the ***. A 45F *** guide catheter was used to engage the *** coronary artery. Heparin was used for anticoagulation. A wireless FFR guidewire was advanced to the ***. Pd/Pa was obtained at rest. (Pd/Pa = 0.***)     An intravenous infusion of adenosine was given at 140 mcg/kg/min, and the FFR was obtained at maximal hyperemia at 2 minutes. (FFR = ***).    Sequential boluses of intracoronary adenosine ( and ) were given, and the FFR was obtained at maximal hyperemia. (FFR = ***).    OCT  A St. Jude Dragonfly OCT catheter was advanced down the wire and used to visualize the ***.    IVUS  An [employer] IVUS catheter was advanced down the wire and used to visualize the ***.    ____________________________________________________________________________    FINDINGS    Hemodynamics and Left Heart Catheterization  ?? Aortic pressure: *** mm Hg (mean *** mm Hg)  ?? Left ventricular filling pressure: *** (LVEDP = *** mm Hg).    Left Ventriculogram  ?? RAO Left Ventriculogram:{Blank single:19197::Normal,Abnormal,Not Performed}  ?? Ejection Fraction (visual estimate): ***%  ?? Mitral Regurgitation: {Blank single:19197::1+,2+,3+,4+,None}  ?? Wall motion: {Blank single:19197::Global hypokinesis,Hypercontractile,Takotsubo pattern cardiomyopathy,Anterior hypokinesis,Anterior akinesis,Anterior dyskinesis,Apical hypokinesis,Apical Kerrin Champagne dyskinesis,Inferior hypokinesis,Inferior [employer] dyskinesis,Lateral hypokinesis,Lateral akinesis,Lateral dyskinesis,Normal}    Coronary Angiography    Dominance: Right***    Left Main: *** The left main coronary artery (LMCA) is a large-caliber vessel that originates from the left coronary sinus. It bifurcates into the left anterior descending (LAD) and left circumflex (LCx) arteries. There is no angiographic evidence of significant disease in the LMCA.    LAD: *** The LAD is a large-caliber vessel that gives off *** diagonal (D) branches before it wraps around the apex. D1 is a ***-caliber vessel. D2 is a ***-caliber vessel. There is no angiographic evidence of significant disease in the LAD.    Left Circumflex: *** The LCx is a large-caliber vessel that gives off *** obtuse marginal (OM) branches and then continues as a small vessel in the AV groove. OM1 is a ***-caliber vessel. OM2 is a ***-caliber vessel. There is no angiographic evidence of significant disease in the LCx.    Right Coronary: *** The right coronary artery (RCA) is a large-caliber vessel originating from the right coronary sinus. It bifurcates distally into the posterior descending artery (PDA) and a posterolateral (PL) branch consistent with a  right dominant system. There is no angiographic evidence of significant disease in the RCA.    Bypass Graft Angiography:  ***      Clinical Predictors:  Cardiogenic shock prior to procedure:{Blank single:19197::Yes,No}  NYHA functional class :{Blank single:19197::1,2,3,4}  Procedure Status: {Blank single:19197::Elective,Urgent,Emergent,Salvage}  Dialysis: {Blank single:19197::Yes,No}  Cardiac arrrest prior to PCI: {Blank single:19197::No,Out of Hospital,At Transferring Facility,At this Facility}  CHSA Frailty Score:  {Blank single:19197::1: Very Fit,2: Well,3: Managing Well,4:Vulnerable,5: Mildly Frail,6: Moderately Frail,7: Severely Frail,8: Very Severely Frail,9: Terminally Ill}  Syntax Score:  {Blank single:19197::Not Performed,Low Risk,Intermediate Risk,High Risk}    PCI Indication  Presentation:  (STEMI, NSTEMI, Unstable angina, Stable angina):  ***  Angina class:  (Class I-IV):  ***  Assessment for dual-antiplatelet therapy:  ***  Pre-procedure non-invasive study:  (low, intermediate, high risk): ***  FFR (if performed): ***      Complications:  None  Blood loss:  Minimal  Specimen:  None  Device/Implants:  ***    Pre-procedure Dx:  ***  Post-procedure Dx:  ***      I personally spent *** minutes continuously monitoring the patient during the administration of moderate sedation.  Pre and post sedation activities have been reviewed.    ____________________________________________________________________________    Lourdes Sledge, MD  Interventional Cardiology Fellow    As PCI was performed / angina was present, cardiac rehab referral ordered and was discussed with patient.  ____________________________________________________________________________  I was present for the entire duration of the procedure, as detailed above  Hoy Finlay, MD

## 2021-05-03 NOTE — Unmapped (Signed)
Received patient from cath lab, report from [facility]. Pt is A+ox4, vss. Rt fem arterial cath site with dry gauze dressing intact, no bleeding or hematoma noted. Pt instructed to hold pressure over cath site if he coughs or sneezes. Strict bedrest. IVF infusing.

## 2021-05-03 NOTE — Unmapped (Cosign Needed)
CARDIOLOGY PROCEDURE PROGRESS NOTE:   Admit Date: May 03, 2021         Attending: Marlaine Hind, MD    Primary Service: Cards Procedures (MDS)    Admitting Diagnosis:  Coronary artery disease of native artery of native heart with stable angina pectoris (CMS-HCC) [I25.118]  Dyspnea on exertion [R06.09]  Essential hypertension [I10]    Patient seen and evaluated post procedure.  I agree with H&P as recorded.    Right femoral arterial puncture site(s) with dressing clean, dry and intact. No bleeding, hematoma or bruit noted.    Procedure:  05/03/2021 Coronary angiogram with percutaneous coronary intervention (PCI). See cath report for details.         ASSESSMENT AND PLAN      ASSESSMENT AND PLAN:    Bernard Skinner is a 65 y.o.-year-old male with CAD who is admitted s/p coronary angiogram and percutaneous coronary intervention (PCI).     1. CAD s/p CTO PCI of LAD   * Monitor on telemetry.  * Vital signs per protocol.   * Monitor femoral arterial access site(s) for bleeding or hematoma.  * Start clopidogrel 75mg  daily and continue ASA 81mg  daily.  * Continue Repatha.   * Up ad lib after bedrest complete.  * Maintain IV/Medlock per protocol.  * Check 12 lead EKG   * Clinical Care Management team paged with cardiac rehab referral. Cardiac Rehab referral placed in EPIC also.     2. HTN   * Continue home medications.     3. Type II DM  * Check blood glucose AC&HS and cover with sliding scale insulin (Lispro) per protocol.  * Hold Metformin x 48 hours.     DISPO:  * Admit Cardiology  * Floor status  * Anticipate discharge after EKG reviewed and patient hemodynamically stable 6 hours post-procedure.      FEN:    * IVF at 100 mL/hr per interventional fellow  * Monitor/replete electrolytes prn  * Heart healthy diet    DVT/GI Prophylaxis:  * PPI ppx: GI proph not indicated as patient to be discharged <24 hours post-procedure.   * DVT ppx: encourage ambulation after bedrest complete.        BEST PRACTICE:  BB: no  If No-Reason/Contraindication: LVEF >40%    Statin: no  If No-Reason/Contraindication: intolerance, on Repatha    ASA: yes  If No-Reason/Contraindication: N/A    Antiplatelet agent: yes   If No-Reason/Contraindication: N/A   Cardiac Rehab Referral: YES  Smoking Cessation Counseling: YES  Patient was counseled on smoking cessation and/or continued abstinence from tobacco as a strategy to reduce risk of cardiovascular disease and events. Patient participated in the counseling and verbalized understanding of the serious risks of using nicotine products.       Lab Results   Component Value Date    WBC 7.3 05/03/2021    HGB 15.4 05/03/2021    HCT 45.6 05/03/2021    PLT 261 05/03/2021       Lab Results   Component Value Date    NA 140 05/03/2021    K 4.2 05/03/2021    CL 105 05/03/2021    CO2 30.0 05/03/2021    BUN 25 (H) 05/03/2021    CREATININE 0.91 05/03/2021    GLU 143 (H) 05/03/2021    CALCIUM 9.6 05/03/2021     Lab Results   Component Value Date    LDL 107 (H) 07/01/2020       Lab Results  Component Value Date    A1C 7.3 02/07/2018

## 2021-05-04 MED ORDER — CLOPIDOGREL 75 MG TABLET
ORAL_TABLET | Freq: Every day | ORAL | 11 refills | 30.00000 days | Status: CP
Start: 2021-05-04 — End: ?
  Filled 2021-05-03: qty 30, 30d supply, fill #0

## 2021-05-04 NOTE — Unmapped (Signed)
Brief Operative Note  (CSN: [id number])      Date of Surgery: 05/03/2021    Pre-op Diagnosis: CAD    Post-op Diagnosis: Coronary artery disease of native artery of native heart with stable angina pectoris (CMS-HCC) [I25.118]  Dyspnea on exertion [R06.09]  Essential hypertension [I10]    Procedure(s):  CTO PCI: 98119 (CPT??)  Note: Revisions to procedures should be made in chart - see Procedures activity.    Performing Service: Cardiology  Surgeon(s) and Role:     * Marlaine Hind, MD - Primary     * Lourdes Sledge, MD - Fellow - Interventional    Assistant: None    Findings:     CTO LAD- successful PCI with Synergy DES x2  R femoral access 54F  Angioseal closure    Likely home this PM     Anesthesia: Conscious Sedation (Nurse Admin)    Estimated Blood Loss: None    Complications: None    Specimens: None collected    Implants:   Implant Name Type Inv. Item Serial No. Manufacturer Lot No. LRB No. Used Action   STENT SYNERGY [id number] [place] MR - [id number] Stent STENT SYNERGY 2.50X32 XD [place] MR  [employer] [id number] N/A 1 Implanted   STENT SYNERGY 2.50X28 XD [place] MR - [id number] Stent STENT SYNERGY 2.50X28 XD [place] MR  [employer] [id number] N/A 1 Implanted       Surgeon Notes: I was present and scrubbed for the entire procedure    Elpidio Anis   Date: 05/03/2021  Time: 5:48 PM

## 2021-05-05 DIAGNOSIS — F411 Generalized anxiety disorder: Principal | ICD-10-CM

## 2021-05-05 DIAGNOSIS — F32A Depression, unspecified depression type: Principal | ICD-10-CM

## 2021-05-05 MED ORDER — FLUOXETINE 20 MG TABLET
ORAL_TABLET | 2 refills | 0 days
Start: 2021-05-05 — End: ?

## 2021-05-16 NOTE — Unmapped (Signed)
Assessment & Plan        I personally spent 32 minutes face-to-face and non-face-to-face in the care of this patient, which includes all pre, intra, and post visit time on the date of service.            Problem List Items Addressed This Visit     GAD (generalized anxiety disorder)     Since last visit, patient switched himself back to effexor-XR from prozac as prozac did not control chronic leg pains as well. Now well controlled  ?? Prescribed effexor-XR 37.5 mg BID, patient may decrease to 37.5mg  daily if he desires (prefers flexibility of this rx)         Relevant Medications    venlafaxine (EFFEXOR-XR) 37.5 MG 24 hr capsule    Opioid use disorder - Primary     Hx of??opioid??pill??abuse, starting after prescribed opioids for knee/shoulder surgeries. ??Hx??rare MJ use in social situations and daily CBD oil use, so expect UDS + cannabinoid. Stable on Subutex now x several yrs.??  ?? Pt strongly desires to begin gradually tapering down buprenorphine dose. Decrease from??8 mg 2.5 tabs/d (1 am, 1 midday, 1/2 at bedtime) to 1 tab SL bid.  ?? We have discussed the risks of tapering down buprenorphine including relapse and overdose. urged patient if he feels cravings/withdrawal sxs to please increase back to 2.5 tabs/d dose and message me via MyChart/call to increase dosage back to baseline  ?? Has rx narcan nasal spray   ?? Controlled substance contract signed??12/21/20  ?? UDS??collected??today  ?? PDMP today shows 30 day subutex supplies have been filled: 04/18/21, 02/22/21, 12/21/20 (indicating pt is likely already taking less than 2.5 tab/d total ). Testosterone from Endocrinology filled 05/10/21. Phentermine 90 day supply last filled 12/21/20.  ?? Plan f/u in 2 mo (q60d OVs approp as pt stable on med long term and UDSs stable since establishing care w/me). Rec'd f/u 11mo given dose decrease but pt prefers 127mo f/u and says will let me know ASAP if not doing well           Relevant Medications    buprenorphine HCL (SUBUTEX) 8 mg tablet Other Relevant Orders    Benzodiazepines, Conf, Urine    Opiates confirmation, random, urine    Toxicology Screen, Urine    Smoker     Smoker x40 years+. Currently in the process of smoking cessation-has recently started a starting month pack of Chantix  ?? CT lung ca screening done in [facility] 5/22  ?? rx'd Chantix 1mg  BID x 127mos to take after finishes current starter pack-can refill if needed once competes 27mo total course  ?? Congratulated on smoking cessation efforts esp in setting of CAD         Relevant Medications    varenicline (CHANTIX) 1 mg tablet   Other Visit Diagnoses     Need for immunization against influenza        Relevant Orders    INFLUENZA QUAD ADJUVANTED 49YR UP(FLUAD) (Completed)           Return for f/u Suboxone w/UDS (within 60 days).     Medication adherence and barriers to the treatment plan have been addressed. Opportunities to optimize healthy behaviors have been discussed. Patient / caregiver voiced understanding.      Subjective:     Bernard Skinner is a 66 y.o. male who presents for Follow-up            History of Present Illness  Patient presents for follow-up of suboxone clinic. Last seen by me 02/22/21. GAD - tapered off venlafaxine, started fluoxetine 20mg , can titrate up, f/u 59mos-he will message me in Mychart if wants to inc dose before then or has SEs. OUD - Continue buprenorphine??8 mg 2.5 tabs/d as prescribed.??Some days takes less (2 tabs/d). Patient??says??would like to taper off this??starting 05/07/21. Rf'd narcan nasal spray, controlled substance contract signed??12/21/20, UDS??collected??today. PDMP today shows patient last filled buprenorphine 8/22, but he insists he filled rx 9/22 so likely pharmacy error. PDMP also shows testosterone , phentermine, zolpidem. Plan f/u in 2 mo (q60d OVs approp as pt stable on med long term and UDSs stable since establishing care w/me)    03/24/21 patient had a L heart Cath and Coronary Angiography    03/29/21 CTA Cardiac - Coronaries - IMPRESSION: Chronic total occlusion midsegment LAD, severe stenosis proximal right coronary. Multiple additional foci of disease. CAD-RADS 2.0 Category: CAD-RADS 5.  Total coronary occlusion. Plaque Burden: P4V extensive amount of plaque. High risk (portable) features    Seen by Cardiothoracic Surgery 04/05/21 with Cordella Register ANP - CAD - will follow up with patient after receive CTA to evaluate calcium burden. Tobacco use disorder - recommended patient cut back on smoking and use nicotine gum or patches.      04/05/21 New PFT 60.    Seen by [facility] 04/19/21 - Diabetes is reasonably controlled. No changes made to diabetes regimen today. Continue regular dilated eye exams.Will follow up on his pending testosterone level. Will adjust dose of testosterone cypionate if needed. * Will continue screening for toxicities with periodic CBC.     Seen by cardiology 04/20/21 w/ Eppie Gibson, MD. CAD - continue to work on nutrition and weight loss and management of his obstructive sleep apnea.  He is going to quit smoking and moderate his alcohol consumption.  We discussed the risk and benefits of CTO PCI including the risk of perforation, vessel disruption and need for emergency cardiac surgery.  He understands that there is a risk of complications with the procedure and he is very anxious to proceed     Patient doing overall well, had a complete blockage to one of his arteries. Patient is feeling better now. When coming down the stairs, patient felt leg / knee pain. Started taking effexor-XR at 75mg  for a few weeks for relief, then went down to 37.5 mg. Patient started taking chantex, already had a starter pack, needs Rx for continuing medication. Patient is trying very hard to stop cigarette habit. Patient reports local pharmacy does not deal with chantix, needs medication sent to [facility] [place]. Patient would like to taper down subutex, take 1/2 of a film less daily. Recommended patient if he is having opoid cravings or feels the need to get fix from street drugs, to please sent me a message and the dosage can go back up. Patient endorses taking some cough syrup with opioids in it, but was not recent. Endorses receiving fentanyl for procedure. No unexpected entries on UDS. Accepts flu shot.     Taking med regularly- yes  Symptoms of withdrawal- no  Symptoms of opioid cravings- no  Medication side effects- no  Attending counseling- no  Recent relapses- no   Other illicit substance use- no      Hemoglobin A1C (%)   Date Value   02/07/2018 7.3     LDL Calculated (mg/dL)   Date Value   16/02/9603 107 (H)   02/03/2018 76  BP Readings from Last 3 Encounters:   05/18/21 130/83   05/03/21 121/63   04/20/21 136/69     Wt Readings from Last 3 Encounters:   05/18/21 (!) 108.9 kg (240 lb 1.3 oz)   05/03/21 (!) 108.9 kg (240 lb)   04/20/21 (!) 109.8 kg (242 lb)           Review of Systems     History obtained from the patient and chart review     Unless otherwise stated in the HPI:  CONSTITUTIONAL: no f/c/s  HEENT: Eyes: No diplopia or blurred vision. ENT: No earache, sore throat or runny nose.   CARDIOVASCULAR: No pressure, squeezing, strangling, tightness, heaviness or aching about the chest, neck, axilla or epigastrium.   RESPIRATORY: No cough, shortness of breath, PND or orthopnea.   GASTROINTESTINAL: No nausea, vomiting or diarrhea.   GENITOURINARY: No dysuria, frequency or urgency.   MUSCULOSKELETAL: As per HPI.   SKIN: No change in skin, hair or nails.   NEUROLOGIC: No paresthesias, fasciculations, seizures or weakness.   PSYCHIATRIC: No disorder of thought or mood.   ENDOCRINE: No heat or cold intolerance, polyuria or polydipsia.   HEMATOLOGICAL: No easy bruising or bleeding.            Review of History     MEDICATIONS:        Current Outpatient Medications on File Prior to Visit   Medication Sig Dispense Refill   ??? amLODIPine (NORVASC) 10 MG tablet      ??? aspirin (ECOTRIN) 81 MG tablet Take 1 tablet (81 mg total) by mouth daily. 30 tablet 11   ??? cetirizine (ZYRTEC) 10 MG tablet TAKE 1 TABLET BY MOUTH ONCE DAILY 90 tablet 3   ??? clopidogreL (PLAVIX) 75 mg tablet Take 1 tablet (75 mg total) by mouth daily. 30 tablet 11   ??? coenzyme Q10 100 mg capsule Take 100 mg by mouth.     ??? empagliflozin (JARDIANCE) 10 mg tablet Jardiance 10 mg tablet     ??? evolocumab (REPATHA SURECLICK) 140 mg/mL PnIj Inject the contents of one pen (140 mg) under the skin every fourteen (14) days. 6 mL 3   ??? ezetimibe (ZETIA) 10 mg tablet Take 1 tablet by mouth daily.     ??? flash glucose sensor kit 1 each.     ??? fluticasone propionate (FLONASE) 50 mcg/actuation nasal spray 1 spray into each nostril Two (2) times a day. 32 g 11   ??? insulin ASPART (NOVOLOG FLEXPEN) 100 unit/mL (3 mL) injection pen Inject up to 8 units three times daily before meals, as directed     ??? insulin glargine U-300 conc (TOUJEO SOLOSTAR) 300 unit/mL (1.5 mL) injection pen Toujeo SoloStar U-300 Insulin 300 unit/mL (1.5 mL) subcutaneous pen     ??? JARDIANCE 25 mg tablet      ??? lisinopriL-hydrochlorothiazide (PRINZIDE,ZESTORETIC) 10-12.5 mg per tablet      ??? metFORMIN (GLUCOPHAGE) 1000 MG tablet Take 0.5 tablets (500 mg total) by mouth in the morning and 0.5 tablets (500 mg total) in the evening. Take with meals. 30 tablet 0   ??? naloxone (NARCAN) 4 mg nasal spray One spray in either nostril once for known/suspected opioid overdose. May repeat every 2-3 minutes in alternating nostril til EMS arrives 2 each 0   ??? nitroglycerin (NITROSTAT) 0.3 MG SL tablet PLEASE SEE ATTACHED FOR DETAILED DIRECTIONS     ??? ondansetron (ZOFRAN) 8 MG tablet Take 1 tablet (8 mg total) by mouth Two (2)  times a day. 180 tablet 3   ??? pantoprazole (PROTONIX) 40 MG tablet      ??? testosterone cypionate (DEPOTESTOTERONE CYPIONATE) 200 mg/mL injection   0   ??? timolol (TIMOPTIC) 0.5 % ophthalmic solution   4   ??? VASCEPA 1 gram cap TAKE 2 CAPSULES BY MOUTH TWICE A DAY 360 capsule 0   ??? VICTOZA 3-PAK 0.6 mg/0.1 mL (18 mg/3 mL) injection Inject 1.8 mg under the skin daily.   4   ??? vit C/E/Zn/coppr/lutein/zeaxan (PRESERVISION AREDS 2 ORAL) Take 2 tablets by mouth.     ??? zolpidem (AMBIEN) 10 mg tablet 1/2-1 tab po at bedtime prn 30 tablet 0     No current facility-administered medications on file prior to visit.        ALLERGIES:      Statins-hmg-coa reductase inhibitors         PAST MEDICAL HISTORY:     Past Medical History:   Diagnosis Date   ??? Coronary artery disease    ??? Diabetes mellitus (CMS-HCC)    ??? Diverticulosis    ??? Hyperlipidemia     statin intolerant   ??? Hypertension    ??? Opioid abuse, in remission (CMS-HCC) 05/23/2018   ??? Opioid use disorder     on subutex , stable as of 12/19       PAST SURGICAL HISTORY:     Past Surgical History:   Procedure Laterality Date   ??? APPENDECTOMY     ??? arthroscopic rotator cuff repair Right 07/27/2013   ??? FRACTURE SURGERY Right     ORIF R tibial plateau fracdture   ??? LEFT COLECTOMY  1977   ??? PR PRQ TRLUML CORONARY STENT W/ANGIO ONE ART/BRNCH N/A 05/03/2021    Procedure: CTO PCI;  Surgeon: Marlaine Hind, MD;  Location: [facility] CATH;  Service: Cardiology   ??? TONSILLECTOMY         PROBLEM LIST:     Patient Active Problem List    Diagnosis Date Noted   ??? Dyspnea on exertion 04/24/2021   ??? Fatigue 12/21/2020   ??? Gross hematuria 10/24/2020   ??? Disorder of rotator cuff syndrome of left shoulder and allied disorder 08/29/2020   ??? Sputum production 05/11/2020   ??? 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 Rhinorrhea 02/22/2020   ??? Nonintractable headache 12/23/2019   ??? GAD (generalized anxiety disorder) 09/17/2019   ??? Chronic nausea 09/17/2019   ??? Insomnia 09/01/2018   ??? Routine general medical examination at a health care facility 07/03/2018   ??? PND (post-nasal drip) 07/03/2018   ??? Cough 07/03/2018   ??? Morbid obesity (CMS-HCC) 07/03/2018   ??? Screening for colon cancer 05/25/2018   ??? Adenomatous polyp of colon 04/11/2018   ??? 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 Eye disorder 04/11/2018   ??? Smoker 04/11/2018   ??? Opioid use disorder 04/10/2018   ??? Hyperlipidemia 04/09/2018   ??? Back pain at L4-L5 level 02/03/2018   ??? Hypogonadism in male 04/11/2016   ??? CAD (coronary artery disease) 04/06/2015   ??? Sleep apnea 04/06/2015   ??? Erectile dysfunction 02/16/2014   ??? Essential hypertension 02/14/2014   ??? Long-term insulin use (CMS-HCC) 02/14/2014   ??? 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 Chronic knee pain 10/28/2013   ??? S/P rotator cuff repair 09/11/2013   ??? Derangement of  medial meniscus 02/21/2012           SOCIAL HISTORY:      reports that he has been smoking cigarettes. He started smoking about 17 years ago. He has been smoking an average of .5 packs per day. He has never used smokeless tobacco. He reports current alcohol use. He reports current drug use. Drug: Marijuana.   FAMILY HISTORY:     family history includes Alcohol abuse in his father and son; Diabetes in his paternal aunt and son; Drug abuse in his son; Liver disease in his father; Mental illness in his son.       Objective:    BP 130/83  - Pulse 69  - Temp 36.2 ??C (97.2 ??F) (Temporal)  - Wt (!) 108.9 kg (240 lb 1.3 oz)  - BMI 35.45 kg/m??  - Body mass index is 35.45 kg/m??.    Physical exam:    General:  Well appearing, well nourished in no distress.   Skin: no obvious rash or  prominent lesions    Cardiovascular: no obvious JVD  Eyes:  conjunctiva clear, sclera non-icteric   Ears/nose/throat: no obvious external deformities  Respiratory: no distress    Musculoskeletal:  Normal gait and station.  Neurologic: moves extremities symmetrically, cranial nerves grossly intact  Hematologic: no obvious ecchymosis     Psychiatric:  Oriented X3, intact judgement and insight, normal mood and affect.          No results found for this or any previous visit (from the past 24 hour(s)).     I attest that I, Paulino Rily, personally documented this note while acting as scribe for Arlyss Gandy, MD.      Paulino Rily, Scribe.  05/18/2021     The documentation recorded by the scribe accurately reflects the service I personally performed and the decisions made by me.    Arlyss Gandy, MD

## 2021-05-18 ENCOUNTER — Ambulatory Visit: Admit: 2021-05-18 | Discharge: 2021-05-19 | Payer: PRIVATE HEALTH INSURANCE

## 2021-05-18 DIAGNOSIS — F17200 Nicotine dependence, unspecified, uncomplicated: Principal | ICD-10-CM

## 2021-05-18 DIAGNOSIS — Z23 Encounter for immunization: Principal | ICD-10-CM

## 2021-05-18 DIAGNOSIS — F1190 Opioid use, unspecified, uncomplicated: Principal | ICD-10-CM

## 2021-05-18 DIAGNOSIS — F411 Generalized anxiety disorder: Principal | ICD-10-CM

## 2021-05-18 LAB — TOXICOLOGY SCREEN, URINE
AMPHETAMINE SCREEN URINE: NEGATIVE
BARBITURATE SCREEN URINE: NEGATIVE
BENZODIAZEPINE SCREEN, URINE: NEGATIVE
BUPRENORPHINE, URINE SCREEN: POSITIVE — AB
CANNABINOID SCREEN URINE: NEGATIVE
COCAINE(METAB.)SCREEN, URINE: NEGATIVE
FENTANYL SCREEN, URINE: NEGATIVE
METHADONE SCREEN, URINE: NEGATIVE
OPIATE SCREEN URINE: NEGATIVE
OXYCODONE SCREEN URINE: NEGATIVE

## 2021-05-18 MED ORDER — VENLAFAXINE ER 37.5 MG CAPSULE,EXTENDED RELEASE 24 HR
ORAL_CAPSULE | Freq: Every day | ORAL | 3 refills | 90.00000 days | Status: CP
Start: 2021-05-18 — End: 2022-05-18

## 2021-05-18 MED ORDER — VARENICLINE 1 MG TABLET
ORAL_TABLET | Freq: Two times a day (BID) | ORAL | 1 refills | 30 days | Status: CP
Start: 2021-05-18 — End: 2021-07-17

## 2021-05-18 MED ORDER — BUPRENORPHINE HCL 8 MG SUBLINGUAL TABLET
ORAL_TABLET | Freq: Two times a day (BID) | SUBLINGUAL | 1 refills | 30 days | Status: CP
Start: 2021-05-18 — End: 2021-07-17

## 2021-05-18 NOTE — Unmapped (Addendum)
Hx of??opioid??pill??abuse, starting after prescribed opioids for knee/shoulder surgeries. ??Hx??rare MJ use in social situations and daily CBD oil use, so expect UDS + cannabinoid. Stable on Subutex now x several yrs.??  ?? Pt strongly desires to begin gradually tapering down buprenorphine dose. Decrease from??8 mg 2.5 tabs/d (1 am, 1 midday, 1/2 at bedtime) to 1 tab SL bid.  ?? We have discussed the risks of tapering down buprenorphine including relapse and overdose. urged patient if he feels cravings/withdrawal sxs to please increase back to 2.5 tabs/d dose and message me via MyChart/call to increase dosage back to baseline  ?? Has rx narcan nasal spray   ?? Controlled substance contract signed??12/21/20  ?? UDS??collected??today  ?? PDMP today shows 30 day subutex supplies have been filled: 04/18/21, 02/22/21, 12/21/20 (indicating pt is likely already taking less than 2.5 tab/d total ). Testosterone from Endocrinology filled 05/10/21. Phentermine 90 day supply last filled 12/21/20.  ?? Plan f/u in 2 mo (q60d OVs approp as pt stable on med long term and UDSs stable since establishing care w/me). Rec'd f/u 5mo given dose decrease but pt prefers 79mo f/u and says will let me know ASAP if not doing well

## 2021-05-18 NOTE — Unmapped (Addendum)
Smoker x40 years+. Currently in the process of smoking cessation-has recently started a starting month pack of Chantix  ?? CT lung ca screening done in [facility] 5/22  ?? rx'd Chantix 1mg  BID x 2mos to take after finishes current starter pack-can refill if needed once competes 22mo total course  ?? Congratulated on smoking cessation efforts esp in setting of CAD

## 2021-05-18 NOTE — Unmapped (Addendum)
Since last visit, patient switched himself back to effexor-XR from prozac as prozac did not control chronic leg pains as well. Now well controlled  ?? Prescribed effexor-XR 37.5 mg BID, patient may decrease to 37.5mg  daily if he desires (prefers flexibility of this rx)

## 2021-05-22 LAB — BENZODIAZEPINES, CONF, URINE
2-HYDROXY ETHYL FLURAZEPAM BY MS CONFIRM: NEGATIVE ng/mL
ALPHA-HYDROXY MIDAZOLAM BY MS CONFIRM: NEGATIVE ng/mL
ALPRAZOLAM BY MS CONFIRM: NEGATIVE ng/mL
BENZODIAZEPINES INTERPRETATION: NEGATIVE
CHLORDIAZEPOXIDE BY MS CONFIRM: NEGATIVE ng/mL
CLOBAZAM BY MS CONFIRM: NEGATIVE ng/mL
CLONAZEPAM BY MS CONFIRM: NEGATIVE ng/mL
DIAZEPAM BY MS CONFIRM: NEGATIVE ng/mL
FLUNITRAZEPAM BY MS CONFIRM: NEGATIVE ng/mL
FLURAZEPAM BY MS CONFIRM: NEGATIVE ng/mL
MIDAZOLAM BY MS CONFIRM: NEGATIVE ng/mL
N-DESMETHYLCLOBAZAM BY MS CONFIRM: NEGATIVE ng/mL
PRAZEPAM BY MS CONFIRM: NEGATIVE ng/mL
TEMAZEPAM-BY GC/MS: NEGATIVE ng/mL
TRIAZOLAM BY MS CONFIRM: NEGATIVE ng/mL
ZOLPIDEM BY MS CONFIRM: NEGATIVE ng/mL
ZOLPIDEM PHENYL-4-CARBOXYLIC ACID BY MS CONFIRM: NEGATIVE ng/mL

## 2021-05-26 LAB — OPIATE, URINE, QUANTITATIVE
6-MONOACETYLMRPH: <5 ng/mL (ref <5)
BUPRENORPHINE: 39 ng/mL — ABNORMAL HIGH (ref <5)
CODEINE GC/MS CONF: <25 ng/mL (ref <25)
FENTANYL, URINE GC/MS: <0.5 ng/mL (ref <0.5)
HYDROCODONE GC/MS CONF: <25 ng/mL (ref <25)
HYDROMORPHONE GC/MS CONF: <25 ng/mL (ref <25)
MORPHINE GC/MS CONF: <25 ng/mL (ref <25)
NORBUPRENORPHINE: 117 ng/mL — ABNORMAL HIGH (ref <5)
NORFENTANYL, UR GC/MS: <1 ng/mL (ref <1)
OPIATE INTERP: POSITIVE
OXYCODONE (GC/MS): <25 ng/mL (ref <25)
OXYMORPHONE: <25 ng/mL (ref <25)

## 2021-05-30 ENCOUNTER — Other Ambulatory Visit: Payer: Self-pay

## 2021-05-30 ENCOUNTER — Encounter: Payer: Self-pay | Admitting: *Deleted

## 2021-05-30 ENCOUNTER — Encounter: Payer: HMO | Attending: Cardiovascular Disease | Admitting: *Deleted

## 2021-05-30 DIAGNOSIS — Z955 Presence of coronary angioplasty implant and graft: Secondary | ICD-10-CM

## 2021-05-30 NOTE — Progress Notes (Signed)
Virtual orientation call completed today. he has an appointment on Date: 06/13/2021  for EP eval and gym Orientation.  Documentation of diagnosis can be found in [facility] Date: 05/03/2021 .    Joshua Fowler is a current tobacco user. Intervention for tobacco cessation was provided at the initial medical review. He was asked about readiness to quit and reported he is working on quitting by decreasing number of cigarettes per day and he has started taking Chantix . Patient was advised and educated about tobacco cessation using combination therapy, tobacco cessation classes, quit line, and quit smoking apps. Patient demonstrated understanding of this material. Staff will continue to provide encouragement and follow up with the patient throughout the program.

## 2021-06-01 DIAGNOSIS — I25118 Atherosclerotic heart disease of native coronary artery with other forms of angina pectoris: Principal | ICD-10-CM

## 2021-06-02 IMAGING — CT CT CHEST LUNG CANCER SCREENING LOW DOSE W/O CM
2 of 5 series · 15 of 40 positions shown, 18 images · non-contrast
Comparison: None.

CLINICAL DATA: 65-year-old asymptomatic male current smoker with
22.5 pack-year smoking history.

EXAM:
CT CHEST WITHOUT CONTRAST LOW-DOSE FOR LUNG CANCER SCREENING
TECHNIQUE: Multidetector CT imaging of the chest was performed following the
standard protocol without IV contrast.

[Series 3: lung 1.00 · axial · 0.80mm/px · z∈[-1212,-910]mm · 12 of 334 slices shown, 15 images]
[im 16/334  mediastinal]
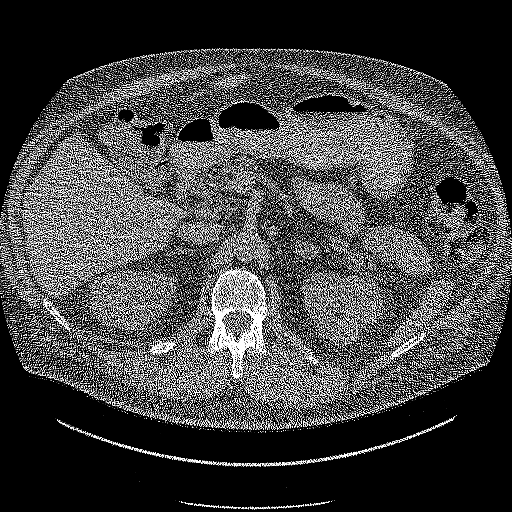
[im 16/334  lung]
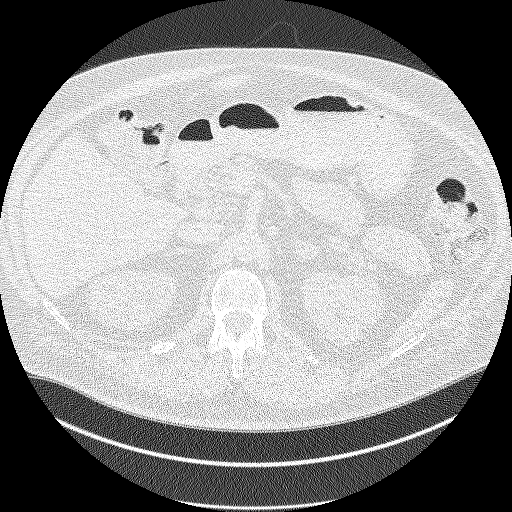
[im 46/334  lung]
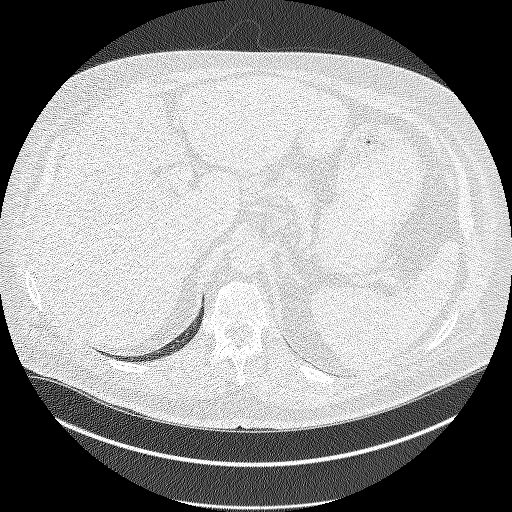
[im 76/334  lung]
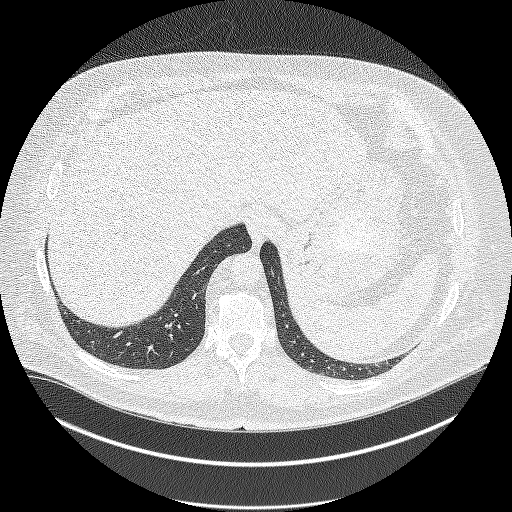
[im 106/334  lung]
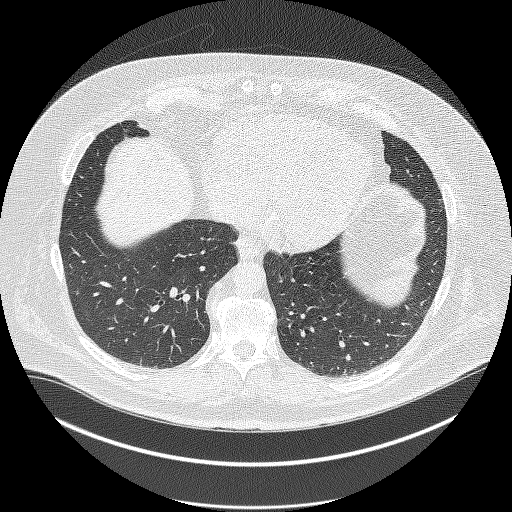
[im 122/334  mediastinal]
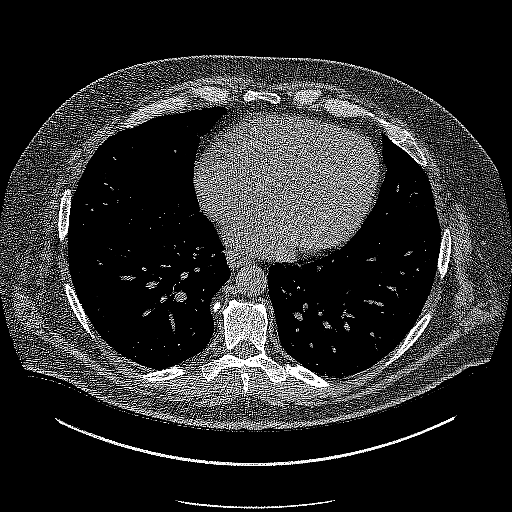
[im 122/334  lung]
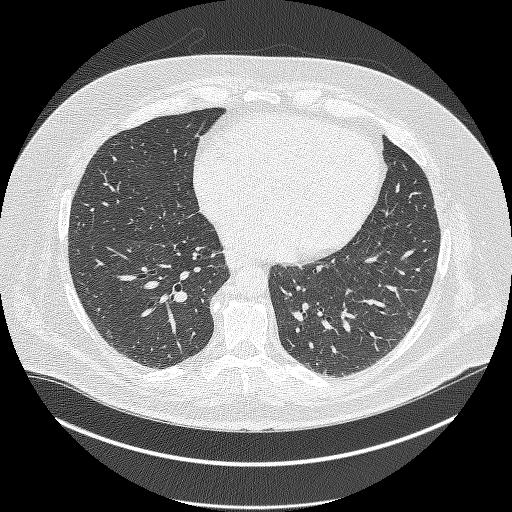
[im 152/334  lung]
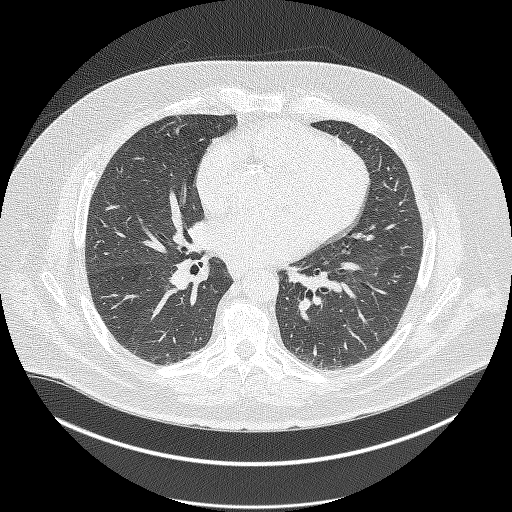
[im 182/334  lung]
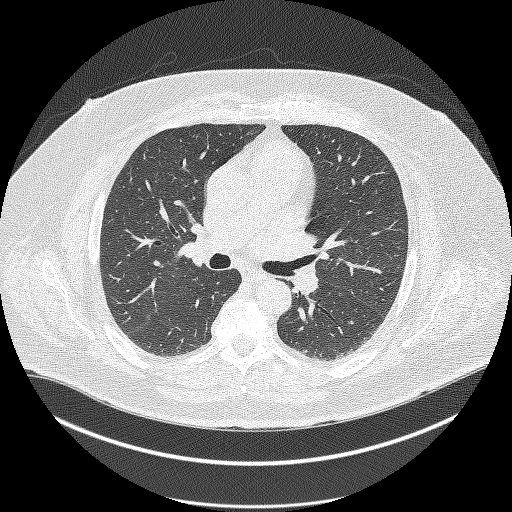
[im 212/334  lung]
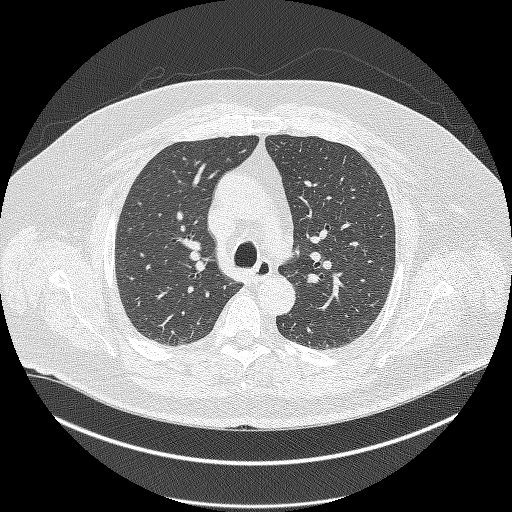
[im 228/334  mediastinal]
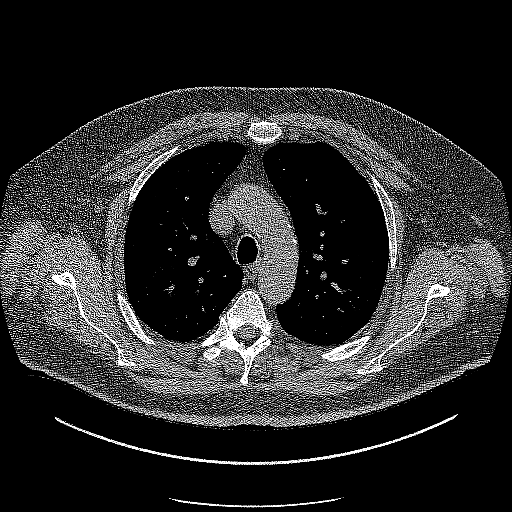
[im 228/334  lung]
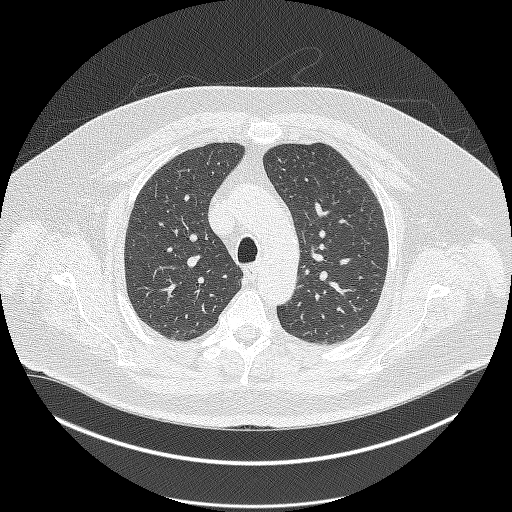
[im 258/334  lung]
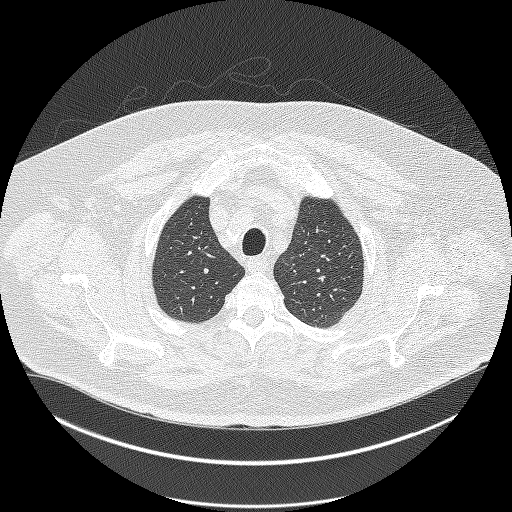
[im 288/334  lung]
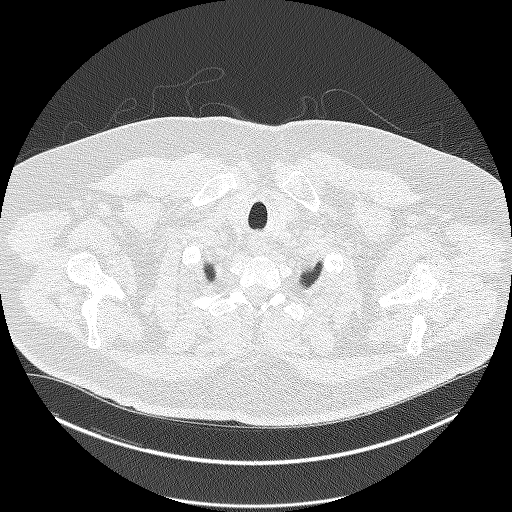
[im 318/334  lung]
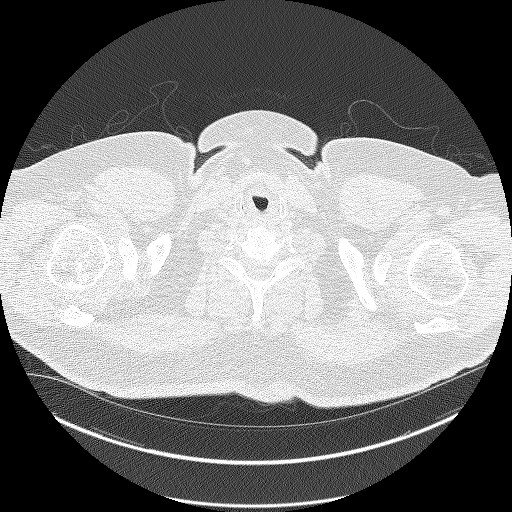

[Series 5: coronals lung 1.00 cor · coronal · 0.65mm/px · 3 of 379 slices shown]
[im 76/379  lung]
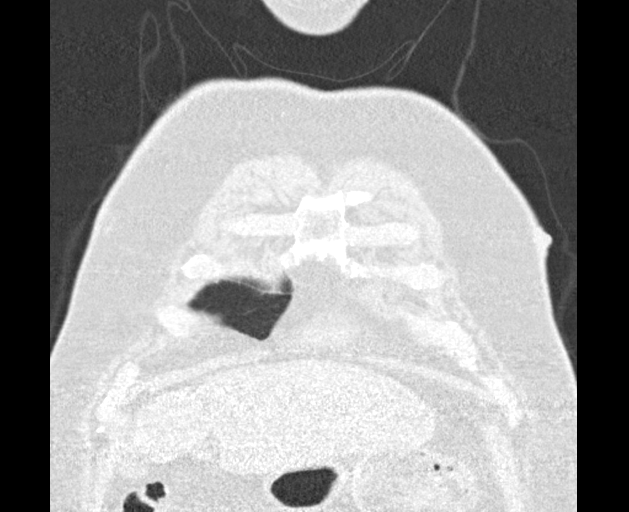
[im 152/379  lung]
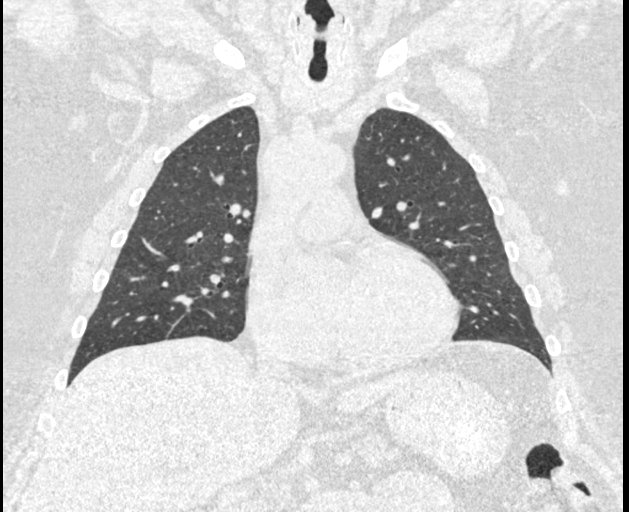
[im 227/379  lung]
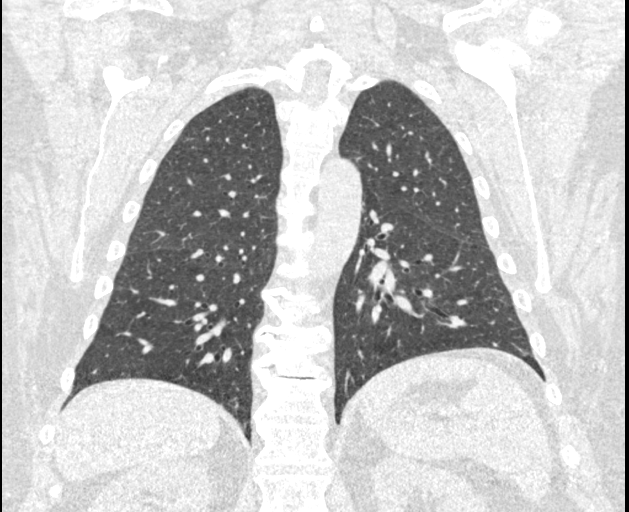

[15 of 40 positions shown; findings below may reference images not displayed]

FINDINGS: Cardiovascular: Top-normal heart size. No significant pericardial
effusion/thickening. Three-vessel coronary atherosclerosis.
Atherosclerotic nonaneurysmal thoracic aorta. Top-normal caliber
main pulmonary artery (3.1 cm diameter).

Mediastinum/Nodes: No discrete thyroid nodules. Unremarkable
esophagus. No pathologically enlarged axillary, mediastinal or hilar
lymph nodes, noting limited sensitivity for the detection of hilar
adenopathy on this noncontrast study.

Lungs/Pleura: No pneumothorax. No pleural effusion. Mild
centrilobular emphysema. No acute consolidative airspace disease or
lung masses. No significant pulmonary nodules.

Upper abdomen: Left adrenal 1.8 cm nodule with density 10 HU.

Musculoskeletal: No aggressive appearing focal osseous lesions.
Marked thoracic spondylosis.
IMPRESSION: 1. Lung-RADS 1, negative. Continue annual screening with low-dose
chest CT without contrast in 12 months.
2. Three-vessel coronary atherosclerosis.
3. Left adrenal 1.8 cm nodule with indeterminate density. In the
absence of a history of malignancy, this is presumably an adenoma.
Follow-up adrenal protocol CT abdomen without and with IV contrast
may be considered in 12 months. This recommendation follows ACR
consensus guidelines: Management of Incidental Adrenal Masses: A
White Paper of the ACR Incidental Findings Committee. [HOSPITAL] [id number];[id number].
4. Aortic Atherosclerosis (HFK14-09R.R) and Emphysema ([id number]-DDQ.P).

## 2021-06-06 MED ORDER — FLUOXETINE 20 MG TABLET
ORAL_TABLET | 0 refills | 0.00000 days
Start: 2021-06-06 — End: ?

## 2021-06-07 ENCOUNTER — Ambulatory Visit: Admit: 2021-06-07 | Discharge: 2021-06-07 | Payer: PRIVATE HEALTH INSURANCE

## 2021-06-07 DIAGNOSIS — E1159 Type 2 diabetes mellitus with other circulatory complications: Principal | ICD-10-CM

## 2021-06-07 DIAGNOSIS — R0609 Other forms of dyspnea: Principal | ICD-10-CM

## 2021-06-07 DIAGNOSIS — I25118 Atherosclerotic heart disease of native coronary artery with other forms of angina pectoris: Principal | ICD-10-CM

## 2021-06-07 DIAGNOSIS — I10 Essential (primary) hypertension: Principal | ICD-10-CM

## 2021-06-07 DIAGNOSIS — E7800 Pure hypercholesterolemia, unspecified: Principal | ICD-10-CM

## 2021-06-07 MED ORDER — FLUOXETINE 20 MG TABLET
ORAL_TABLET | 0 refills | 0.00000 days
Start: 2021-06-07 — End: ?

## 2021-06-13 ENCOUNTER — Other Ambulatory Visit: Payer: Self-pay

## 2021-06-13 ENCOUNTER — Encounter: Payer: HMO | Attending: Cardiovascular Disease

## 2021-06-13 VITALS — Ht 68.0 in | Wt 237.1 lb

## 2021-06-13 DIAGNOSIS — Z955 Presence of coronary angioplasty implant and graft: Secondary | ICD-10-CM | POA: Diagnosis present

## 2021-06-13 DIAGNOSIS — E119 Type 2 diabetes mellitus without complications: Secondary | ICD-10-CM | POA: Insufficient documentation

## 2021-06-13 DIAGNOSIS — Z48812 Encounter for surgical aftercare following surgery on the circulatory system: Secondary | ICD-10-CM | POA: Insufficient documentation

## 2021-06-13 NOTE — Progress Notes (Signed)
Cardiac Individual Treatment Plan  Patient Details  Name: Joshua ATES Sr. MRN: [id number] Date of Birth: [birth year] Referring Provider:   Flowsheet Row Cardiac Rehab from 06/13/2021 in [facility] Cardiac and Pulmonary Rehab  Referring Provider Neoma Laming MD       Initial Encounter Date:  Flowsheet Row Cardiac Rehab from 06/13/2021 in [facility] Cardiac and Pulmonary Rehab  Date 06/13/21       Visit Diagnosis: Status post coronary artery stent placement  Patient's Home Medications on Admission:  Current Outpatient Medications:    amLODipine (NORVASC) 10 MG tablet, Take 10 mg by mouth daily., Disp: , Rfl:    aspirin EC 81 MG tablet, Take 81 mg by mouth at bedtime., Disp: , Rfl:    B-D 3CC LUER-LOK SYR 21GX1" 21G X 1" 3 ML MISC, USE TO INJECT 1 ML OF TESTOSTERONE INTO MUSCLE, Disp: , Rfl: 12   BD HYPODERMIC NEEDLE 18G X 1" MISC, USE AS DIRECTED FOR WEEKLY INJECTIONS, Disp: , Rfl: 1   BD ULTRA-FINE PEN NEEDLES 29G X 12.7MM MISC, USE 2 (TWO) TIMES DAILY., Disp: , Rfl: 3   buprenorphine (SUBUTEX) 8 MG SUBL SL tablet, Place 4-8 mg under the tongue 2 (two) times daily as needed (pain). Take 8 mg by mouth in the morning and 4 mg in the evening as needed for pain, Disp: , Rfl:    cetirizine (ZYRTEC) 10 MG tablet, Take 10 mg by mouth daily., Disp: , Rfl:    clopidogrel (PLAVIX) 75 MG tablet, Take 75 mg by mouth daily., Disp: , Rfl:    Coenzyme Q10 (CO Q-10) 100 MG CAPS, Take 100 mg by mouth in the morning and at bedtime., Disp: , Rfl:    Continuous Blood Gluc Receiver (FREESTYLE LIBRE READER) DEVI, USE 1 EACH ONCE DAILY., Disp: , Rfl: 1   Continuous Blood Gluc Sensor (FREESTYLE LIBRE SENSOR SYSTEM) MISC, USE 3 EACH EVERY 10 (TEN) DAYS., Disp: , Rfl: 11   diazepam (VALIUM) 5 MG tablet, Take 2.5 mg by mouth daily as needed for anxiety., Disp: , Rfl:    Evolocumab (REPATHA SURECLICK) 209 MG/ML SOAJ, Inject into the skin., Disp: , Rfl:    ezetimibe (ZETIA) 10 MG tablet, TAKE ONE TABLET EVERY DAY, Disp:  90 tablet, Rfl: 0   FLUoxetine (PROZAC) 20 MG tablet, Take 40 mg by mouth daily. (Patient not taking: Reported on 05/30/2021), Disp: , Rfl:    gabapentin (NEURONTIN) 300 MG capsule, TAKE 1 CAPSULE 3 TIMES DAILY (Patient not taking: Reported on 03/21/2021), Disp: 270 capsule, Rfl: 1   gentamicin cream (GARAMYCIN) 0.1 %, Apply 1 application topically 2 (two) times daily. (Patient taking differently: Apply 1 application topically daily as needed (irritation).), Disp: 15 g, Rfl: 1   hydrocortisone 2.5 % cream, Apply 1 application topically daily as needed (rash).  (Patient not taking: Reported on 03/24/2021), Disp: , Rfl:    ibuprofen (ADVIL) 800 MG tablet, Take 1 tablet (800 mg total) by mouth 3 (three) times daily. (Patient not taking: Reported on 05/30/2021), Disp: 270 tablet, Rfl: 1   insulin aspart (NOVOLOG) 100 UNIT/ML FlexPen, Inject 2-6 Units into the skin 3 (three) times daily as needed (blood sugar of 250 or above)., Disp: , Rfl:    insulin lispro (HUMALOG KWIKPEN) 100 UNIT/ML KiwkPen, Inject 0.4 mLs (40 Units total) into the skin 3 (three) times daily. Use 10-40u with meals (Patient not taking: Reported on 05/30/2021), Disp: 5 pen, Rfl: 11   JARDIANCE 25 MG TABS tablet, Take 25 mg by mouth  daily., Disp: , Rfl:    lisinopril-hydrochlorothiazide (ZESTORETIC) 20-25 MG tablet, Take 1 tablet by mouth daily., Disp: , Rfl:    meloxicam (MOBIC) 7.5 MG tablet, Take 7.5 mg by mouth in the morning and at bedtime. (Patient not taking: Reported on 05/30/2021), Disp: , Rfl:    metFORMIN (GLUCOPHAGE) 1000 MG tablet, Take 1,000 mg by mouth 2 (two) times daily with a meal., Disp: , Rfl:    Multiple Vitamins-Minerals (PRESERVISION AREDS 2 PO), Take 2 tablets by mouth in the morning and at bedtime., Disp: , Rfl:    NARCAN 4 MG/0.1ML LIQD nasal spray kit, Place 0.4 mg into the nose once., Disp: , Rfl:    ondansetron (ZOFRAN) 4 MG tablet, Take 1 tablet (4 mg total) by mouth 2 (two) times daily. (Patient not taking:  Reported on 03/21/2021), Disp: 180 tablet, Rfl: 1   ondansetron (ZOFRAN) 8 MG tablet, Take 8 mg by mouth 2 (two) times daily., Disp: , Rfl:    pantoprazole (PROTONIX) 40 MG tablet, TAKE ONE TABLET EVERY DAY, Disp: 90 tablet, Rfl: 1   sildenafil (REVATIO) 20 MG tablet, Take 1-5 tablets (20-100 mg total) by mouth as needed., Disp: 50 tablet, Rfl: 12   testosterone cypionate (DEPOTESTOSTERONE CYPIONATE) 200 MG/ML injection, Inject 200 mg into the muscle every 14 (fourteen) days. , Disp: , Rfl:    timolol (TIMOPTIC) 0.5 % ophthalmic solution, Place 1 drop into the left eye 2 (two) times daily. , Disp: , Rfl: 3   TOUJEO SOLOSTAR 300 UNIT/ML Solostar Pen, Inject 42 Units into the skin at bedtime., Disp: , Rfl:    varenicline (CHANTIX) 1 MG tablet, Take by mouth., Disp: , Rfl:    VASCEPA 1 g capsule, Take 2 g by mouth 2 (two) times daily., Disp: , Rfl:    venlafaxine XR (EFFEXOR-XR) 37.5 MG 24 hr capsule, Take 1 capsule (37.5 mg total) by mouth daily., Disp: 90 capsule, Rfl: 1   VICTOZA 18 MG/3ML SOPN, Inject 1.8 mg into the skin daily. , Disp: , Rfl:    zolpidem (AMBIEN) 10 MG tablet, Take 10 mg by mouth at bedtime as needed for sleep. , Disp: , Rfl:  No current facility-administered medications for this visit.  Facility-Administered Medications Ordered in Other Visits:    sodium chloride flush (NS) 0.9 % injection 3 mL, 3 mL, Intravenous, Q12H, Dionisio David, MD  Past Medical History: Past Medical History:  Diagnosis Date   A-fib ([place]) 1995   CAD (coronary artery disease)    Diabetes mellitus without complication ([place])    GERD (gastroesophageal reflux disease)    History of kidney stones    Hyperlipidemia    Hypertension    Sleep apnea    cpap   Type 2 diabetes mellitus without complication ([place])     Tobacco Use: Social History   Tobacco Use  Smoking Status Every Day   Packs/day: 0.75   Years: 30.00   Pack years: 22.50   Types: Cigarettes  Smokeless Tobacco Never  Tobacco  Comments   Working down number of cigarettes a day and has started Chantix    Labs: Recent Chemical engineer     Labs for ITP Cardiac and Pulmonary Rehab Latest Ref Rng & Units 04/11/2016 11/12/2016 05/21/2017 02/03/2018 09/01/2019   Cholestrol 100 - 199 mg/dL 112 167 179 172 142   LDLCALC 0 - 99 mg/dL 61 - 95 - 85   HDL >39 mg/dL 31(L) - 34(L) - 36(L)   Trlycerides 0 - 149 mg/dL  99 211(H) 249(H) 297(H) 117   Hemoglobin A1c <7.0 % - - - - 6.3        Exercise Target Goals: Exercise Program Goal: Individual exercise prescription set using results from initial 6 min walk test and THRR while considering  patients activity barriers and safety.   Exercise Prescription Goal: Initial exercise prescription builds to 30-45 minutes a day of aerobic activity, 2-3 days per week.  Home exercise guidelines will be given to patient during program as part of exercise prescription that the participant will acknowledge.   Education: Aerobic Exercise: - Group verbal and visual presentation on the components of exercise prescription. Introduces F.I.T.T principle from ACSM for exercise prescriptions.  Reviews F.I.T.T. principles of aerobic exercise including progression. Written material given at graduation.   Education: Resistance Exercise: - Group verbal and visual presentation on the components of exercise prescription. Introduces F.I.T.T principle from ACSM for exercise prescriptions  Reviews F.I.T.T. principles of resistance exercise including progression. Written material given at graduation.    Education: Exercise & Equipment Safety: - Individual verbal instruction and demonstration of equipment use and safety with use of the equipment. Flowsheet Row Cardiac Rehab from 06/13/2021 in [facility] Cardiac and Pulmonary Rehab  Education need identified 06/13/21  Date 06/13/21  Educator [place]  Instruction Review Code 1- Verbalizes Understanding       Education: Exercise Physiology & General Exercise  Guidelines: - Group verbal and written instruction with models to review the exercise physiology of the cardiovascular system and associated critical values. Provides general exercise guidelines with specific guidelines to those with heart or lung disease.    Education: Flexibility, Balance, Mind/Body Relaxation: - Group verbal and visual presentation with interactive activity on the components of exercise prescription. Introduces F.I.T.T principle from ACSM for exercise prescriptions. Reviews F.I.T.T. principles of flexibility and balance exercise training including progression. Also discusses the mind body connection.  Reviews various relaxation techniques to help reduce and manage stress (i.e. Deep breathing, progressive muscle relaxation, and visualization). Balance handout provided to take home. Written material given at graduation.   Activity Barriers & Risk Stratification:  Activity Barriers & Cardiac Risk Stratification - 06/13/21 1002       Activity Barriers & Cardiac Risk Stratification   Activity Barriers Deconditioning;Other (comment);Joint Problems    Comments Diabetic neuropathy, knee & hip pain    Cardiac Risk Stratification Moderate             6 Minute Walk:  6 Minute Walk     Row Name 06/13/21 1004         6 Minute Walk   Phase Initial     Distance 1235 feet     Walk Time 6 minutes     # of Rest Breaks 0     MPH 2.33     METS 2.35     RPE 10     Perceived Dyspnea  1     VO2 Peak 8.24     Symptoms Yes (comment)     Comments Bilateral hip pain 6/10     Resting HR 80 bpm     Resting BP 124/80     Resting Oxygen Saturation  97 %     Exercise Oxygen Saturation  during 6 min walk 95 %     Max Ex. HR 105 bpm     Max Ex. BP 138/78     2 Minute Post BP 118/76              Oxygen Initial  Assessment:   Oxygen Re-Evaluation:   Oxygen Discharge (Final Oxygen Re-Evaluation):   Initial Exercise Prescription:  Initial Exercise Prescription -  06/13/21 1000       Date of Initial Exercise RX and Referring Provider   Date 06/13/21    Referring Provider Neoma Laming MD      Oxygen   Maintain Oxygen Saturation 88% or higher      Treadmill   MPH 2.4    Grade 0    Minutes 15    METs 2.84      T5 Nustep   Level 1    SPM 80    Minutes 15    METs 2.3      Biostep-RELP   Level 1    SPM 50    Minutes 15    METs 2.3      Track   Laps 28    Minutes 15    METs 2.52      Prescription Details   Frequency (times per week) 3    Duration Progress to 30 minutes of continuous aerobic without signs/symptoms of physical distress      Intensity   THRR 40-80% of Max Heartrate 109 - 139    Ratings of Perceived Exertion 11-13    Perceived Dyspnea 0-4      Progression   Progression Continue to progress workloads to maintain intensity without signs/symptoms of physical distress.      Resistance Training   Training Prescription Yes    Weight 5 lb    Reps 10-15             Perform Capillary Blood Glucose checks as needed.  Exercise Prescription Changes:   Exercise Prescription Changes     Row Name 06/13/21 1000             Response to Exercise   Blood Pressure (Admit) 124/80       Blood Pressure (Exercise) 138/78       Blood Pressure (Exit) 118/76       Heart Rate (Admit) 80 bpm       Heart Rate (Exercise) 105 bpm       Heart Rate (Exit) 81 bpm       Oxygen Saturation (Admit) 97 %       Oxygen Saturation (Exercise) 95 %       Rating of Perceived Exertion (Exercise) 10       Perceived Dyspnea (Exercise) 1       Symptoms Bilateral hip pain 6/10       Comments walk test results                Exercise Comments:   Exercise Goals and Review:   Exercise Goals     Row Name 06/13/21 1035             Exercise Goals   Increase Physical Activity Yes       Intervention Provide advice, education, support and counseling about physical activity/exercise needs.;Develop an individualized exercise  prescription for aerobic and resistive training based on initial evaluation findings, risk stratification, comorbidities and participant's personal goals.       Expected Outcomes Short Term: Attend rehab on a regular basis to increase amount of physical activity.;Long Term: Add in home exercise to make exercise part of routine and to increase amount of physical activity.;Long Term: Exercising regularly at least 3-5 days a week.       Increase Strength and Stamina Yes  Intervention Provide advice, education, support and counseling about physical activity/exercise needs.;Develop an individualized exercise prescription for aerobic and resistive training based on initial evaluation findings, risk stratification, comorbidities and participant's personal goals.       Expected Outcomes Short Term: Increase workloads from initial exercise prescription for resistance, speed, and METs.;Short Term: Perform resistance training exercises routinely during rehab and add in resistance training at home;Long Term: Improve cardiorespiratory fitness, muscular endurance and strength as measured by increased METs and functional capacity (6MWT)       Able to understand and use rate of perceived exertion (RPE) scale Yes       Intervention Provide education and explanation on how to use RPE scale       Expected Outcomes Short Term: Able to use RPE daily in rehab to express subjective intensity level;Long Term:  Able to use RPE to guide intensity level when exercising independently       Able to understand and use Dyspnea scale Yes       Intervention Provide education and explanation on how to use Dyspnea scale       Expected Outcomes Short Term: Able to use Dyspnea scale daily in rehab to express subjective sense of shortness of breath during exertion;Long Term: Able to use Dyspnea scale to guide intensity level when exercising independently       Knowledge and understanding of Target Heart Rate Range (THRR) Yes        Intervention Provide education and explanation of THRR including how the numbers were predicted and where they are located for reference       Expected Outcomes Short Term: Able to state/look up THRR;Long Term: Able to use THRR to govern intensity when exercising independently;Short Term: Able to use daily as guideline for intensity in rehab       Able to check pulse independently Yes       Intervention Provide education and demonstration on how to check pulse in carotid and radial arteries.;Review the importance of being able to check your own pulse for safety during independent exercise       Expected Outcomes Short Term: Able to explain why pulse checking is important during independent exercise;Long Term: Able to check pulse independently and accurately       Understanding of Exercise Prescription Yes       Intervention Provide education, explanation, and written materials on patient's individual exercise prescription       Expected Outcomes Short Term: Able to explain program exercise prescription;Long Term: Able to explain home exercise prescription to exercise independently                Exercise Goals Re-Evaluation :   Discharge Exercise Prescription (Final Exercise Prescription Changes):  Exercise Prescription Changes - 06/13/21 1000       Response to Exercise   Blood Pressure (Admit) 124/80    Blood Pressure (Exercise) 138/78    Blood Pressure (Exit) 118/76    Heart Rate (Admit) 80 bpm    Heart Rate (Exercise) 105 bpm    Heart Rate (Exit) 81 bpm    Oxygen Saturation (Admit) 97 %    Oxygen Saturation (Exercise) 95 %    Rating of Perceived Exertion (Exercise) 10    Perceived Dyspnea (Exercise) 1    Symptoms Bilateral hip pain 6/10    Comments walk test results             Nutrition:  Target Goals: Understanding of nutrition guidelines, daily intake of sodium <1579m, cholesterol <  233m, calories 30% from fat and 7% or less from saturated fats, daily to have 5 or  more servings of fruits and vegetables.  Education: All About Nutrition: -Group instruction provided by verbal, written material, interactive activities, discussions, models, and posters to present general guidelines for heart healthy nutrition including fat, fiber, MyPlate, the role of sodium in heart healthy nutrition, utilization of the nutrition label, and utilization of this knowledge for meal planning. Follow up email sent as well. Written material given at graduation. [facility]  Education need identified 06/13/21       Biometrics:  Pre Biometrics - 06/13/21 1002       Pre Biometrics   Height 5' 8" (1.727 m)    Weight 237 lb 1.6 oz (107.5 kg)    BMI (Calculated) 36.06    Single Leg Stand 3.2 seconds              Nutrition Therapy Plan and Nutrition Goals:  Nutrition Therapy & Goals - 06/13/21 1030       Intervention Plan   Intervention Prescribe, educate and counsel regarding individualized specific dietary modifications aiming towards targeted core components such as weight, hypertension, lipid management, diabetes, heart failure and other comorbidities.    Expected Outcomes Short Term Goal: Understand basic principles of dietary content, such as calories, fat, sodium, cholesterol and nutrients.;Short Term Goal: A plan has been developed with personal nutrition goals set during dietitian appointment.;Long Term Goal: Adherence to prescribed nutrition plan.             Nutrition Assessments:  MEDIFICTS Score Key: ?70 Need to make dietary changes  40-70 Heart Healthy Diet ? 40 Therapeutic Level Cholesterol Diet  [facility]  Picture Your Plate Total Score on Admission 56      Picture Your Plate Scores: <<48Unhealthy dietary pattern with much room for improvement. 41-50 Dietary pattern unlikely to meet recommendations for good  health and room for improvement. 51-60 More healthful dietary pattern, with some room for improvement.  >60 Healthy dietary pattern, although there may be some specific behaviors that could be improved.    Nutrition Goals Re-Evaluation:   Nutrition Goals Discharge (Final Nutrition Goals Re-Evaluation):   Psychosocial: Target Goals: Acknowledge presence or absence of significant depression and/or stress, maximize coping skills, provide positive support system. Participant is able to verbalize types and ability to use techniques and skills needed for reducing stress and depression.   Education: Stress, Anxiety, and Depression - Group verbal and visual presentation to define topics covered.  Reviews how body is impacted by stress, anxiety, and depression.  Also discusses healthy ways to reduce stress and to treat/manage anxiety and depression.  Written material given at graduation.   Education: Sleep Hygiene -Provides group verbal and written instruction about how sleep can affect your health.  Define sleep hygiene, discuss sleep cycles and impact of sleep habits. Review good sleep hygiene tips.    Initial Review & Psychosocial Screening:  Initial Psych Review & Screening - 05/30/21 1010       Initial Review   Current issues with Current Psychotropic Meds;Current Anxiety/Panic      Family Dynamics   Good Support System? Yes   wife  children, grandkids     Barriers   Psychosocial barriers to participate in program There are no identifiable barriers or psychosocial needs.;The patient should benefit from training in stress management and  relaxation.      Screening Interventions   Interventions Encouraged to exercise    Expected Outcomes Short Term goal: Utilizing psychosocial counselor, staff and physician to assist with identification of specific Stressors or current issues interfering with healing process. Setting desired goal for each stressor or current issue identified.;Long Term  Goal: Stressors or current issues are controlled or eliminated.;Short Term goal: Identification and review with participant of any Quality of Life or Depression concerns found by scoring the questionnaire.;Long Term goal: The participant improves quality of Life and PHQ9 Scores as seen by post scores and/or verbalization of changes             Quality of Life Scores:   Quality of Life - 06/13/21 1030       Quality of Life   Select Quality of Life      Quality of Life Scores   Health/Function Pre 26 %    Socioeconomic Pre 28.07 %    Psych/Spiritual Pre 30 %    Family Pre 30 %    GLOBAL Pre 27.89 %            Scores of 19 and below usually indicate a poorer quality of life in these areas.  A difference of  2-3 points is a clinically meaningful difference.  A difference of 2-3 points in the total score of the Quality of Life Index has been associated with significant improvement in overall quality of life, self-image, physical symptoms, and general health in studies assessing change in quality of life.  PHQ-9: Recent Review Flowsheet Data     Depression screen [facility] 2/9 06/13/2021 09/01/2019 08/06/2018 02/03/2018 11/12/2016   Decreased Interest 0 0 0 0 0   Down, Depressed, Hopeless 0 0 0 0 0   PHQ - 2 Score 0 0 0 0 0   Altered sleeping 0 - - 0 -   Tired, decreased energy 0 - - 0 -   Change in appetite 0 - - 0 -   Feeling bad or failure about yourself  0 - - 0 -   Trouble concentrating 0 - - 0 -   Moving slowly or fidgety/restless 0 - - 0 -   Suicidal thoughts 0 - - 0 -   PHQ-9 Score 0 - - 0 -   Difficult doing work/chores Not difficult at all - - - -      Interpretation of Total Score  Total Score Depression Severity:  1-4 = Minimal depression, 5-9 = Mild depression, 10-14 = Moderate depression, 15-19 = Moderately severe depression, 20-27 = Severe depression   Psychosocial Evaluation and Intervention:  Psychosocial Evaluation - 05/30/21 1022       Psychosocial Evaluation &  Interventions   Interventions Encouraged to exercise with the program and follow exercise prescription    Comments Joshua Fowler has no barriers to attending the program. He wants to see if he can get an exercise routine restarted. He live with his wife and several pets. She and his children,grandchildren are his support system. He does have medications for waht he thinks is anxiety versus depression. He stated that he would at times get too excited over what was going on. The medicine hlep keep that level of anxiety down. He is ready to get started.    Expected Outcomes STG Joshua Fowler attends all scheduled session, he is able to learn relaxation techniques LTG Joshua Fowler is able to continue the exercise progeression and utilize all the relaxation techniques taught during the program.  Continue Psychosocial Services  Follow up required by staff             Psychosocial Re-Evaluation:   Psychosocial Discharge (Final Psychosocial Re-Evaluation):   Vocational Rehabilitation: Provide vocational rehab assistance to qualifying candidates.   Vocational Rehab Evaluation & Intervention:   Education: Education Goals: Education classes will be provided on a variety of topics geared toward better understanding of heart health and risk factor modification. Participant will state understanding/return demonstration of topics presented as noted by education test scores.  Learning Barriers/Preferences:   General Cardiac Education Topics:  AED/CPR: - Group verbal and written instruction with the use of models to demonstrate the basic use of the AED with the basic ABC's of resuscitation.   Anatomy and Cardiac Procedures: - Group verbal and visual presentation and models provide information about basic cardiac anatomy and function. Reviews the testing methods done to diagnose heart disease and the outcomes of the test results. Describes the treatment choices: Medical Management, Angioplasty, or Coronary Bypass  Surgery for treating various heart conditions including Myocardial Infarction, Angina, Valve Disease, and Cardiac Arrhythmias.  Written material given at graduation. Flowsheet Row Cardiac Rehab from 06/13/2021 in [facility] Cardiac and Pulmonary Rehab  Education need identified 06/13/21       Medication Safety: - Group verbal and visual instruction to review commonly prescribed medications for heart and lung disease. Reviews the medication, class of the drug, and side effects. Includes the steps to properly store meds and maintain the prescription regimen.  Written material given at graduation.   Intimacy: - Group verbal instruction through game format to discuss how heart and lung disease can affect sexual intimacy. Written material given at graduation..   Know Your Numbers and Heart Failure: - Group verbal and visual instruction to discuss disease risk factors for cardiac and pulmonary disease and treatment options.  Reviews associated critical values for Overweight/Obesity, Hypertension, Cholesterol, and Diabetes.  Discusses basics of heart failure: signs/symptoms and treatments.  Introduces Heart Failure Zone chart for action plan for heart failure.  Written material given at graduation.   Infection Prevention: - Provides verbal and written material to individual with discussion of infection control including proper hand washing and proper equipment cleaning during exercise session. Flowsheet Row Cardiac Rehab from 06/13/2021 in [facility] Cardiac and Pulmonary Rehab  Education need identified 06/13/21  Date 06/13/21  Educator [place]  Instruction Review Code 1- Verbalizes Understanding       Falls Prevention: - Provides verbal and written material to individual with discussion of falls prevention and safety. Flowsheet Row Cardiac Rehab from 06/13/2021 in [facility] Cardiac [facility] Rehab  Education need identified 06/13/21  Date 06/13/21  Educator [place]  Instruction Review Code 1- Verbalizes  Understanding       Other: -Provides group and verbal instruction on various topics (see comments)   Knowledge Questionnaire Score:  Knowledge Questionnaire Score - 06/13/21 1027       Knowledge Questionnaire Score   Pre Score 22/26: MI, Angina, Nutrition             Core Components/Risk Factors/Patient Goals at Admission:  Personal Goals and Risk Factors at Admission - 06/13/21 1037       Core Components/Risk Factors/Patient Goals on Admission    Weight Management Yes;Obesity;Weight Loss    Intervention Weight Management: Develop a combined nutrition and exercise program designed to reach desired caloric intake, while maintaining appropriate intake of nutrient and fiber, sodium and fats, and appropriate energy expenditure required for the weight goal.;Weight Management:  Provide education and appropriate resources to help participant work on and attain dietary goals.;Weight Management/Obesity: Establish reasonable short term and long term weight goals.    Admit Weight 237 lb (107.5 kg)    Goal Weight: Short Term 232 lb (105.2 kg)    Goal Weight: Long Term 225 lb (102.1 kg)    Expected Outcomes Short Term: Continue to assess and modify interventions until short term weight is achieved;Long Term: Adherence to nutrition and physical activity/exercise program aimed toward attainment of established weight goal;Weight Loss: Understanding of general recommendations for a balanced deficit meal plan, which promotes 1-2 lb weight loss per week and includes a negative energy balance of [phone] kcal/d;Understanding recommendations for meals to include 15-35% energy as protein, 25-35% energy from fat, 35-60% energy from carbohydrates, less than 259m of dietary cholesterol, 20-35 gm of total fiber daily;Understanding of distribution of calorie intake throughout the day with the consumption of 4-5 meals/snacks    Tobacco Cessation Yes    Number of packs per day REdd Arbouris a current tobacco user.  Intervention for tobacco cessation was provided at the initial medical review. He was asked about readiness to quit and reported he is working on quitting by decreasing number of cigarettes per day and he has started taking Chantix . Patient was advised and educated about tobacco cessation using combination therapy, tobacco cessation classes, quit line, and quit smoking apps. Patient demonstrated understanding of this material. Staff will continue to provide encouragement and follow up with the patient throughout the program.    Intervention Assist the participant in steps to quit. Provide individualized education and counseling about committing to Tobacco Cessation, relapse prevention, and pharmacological support that can be provided by physician.;OAdvice worker assist with locating and accessing local/national Quit Smoking programs, and support quit date choice.    Expected Outcomes Short Term: Will demonstrate readiness to quit, by selecting a quit date.;Short Term: Will quit all tobacco product use, adhering to prevention of relapse plan.;Long Term: Complete abstinence from all tobacco products for at least 12 months from quit date.    Diabetes Yes    Intervention Provide education about signs/symptoms and action to take for hypo/hyperglycemia.;Provide education about proper nutrition, including hydration, and aerobic/resistive exercise prescription along with prescribed medications to achieve blood glucose in normal ranges: Fasting glucose 65-99 mg/dL    Expected Outcomes Short Term: Participant verbalizes understanding of the signs/symptoms and immediate care of hyper/hypoglycemia, proper foot care and importance of medication, aerobic/resistive exercise and nutrition plan for blood glucose control.;Long Term: Attainment of HbA1C < 7%.    Hypertension Yes    Intervention Provide education on lifestyle modifcations including regular physical activity/exercise, weight management, moderate  sodium restriction and increased consumption of fresh fruit, vegetables, and low fat dairy, alcohol moderation, and smoking cessation.;Monitor prescription use compliance.    Expected Outcomes Short Term: Continued assessment and intervention until BP is < 140/953mHG in hypertensive participants. < 130/8052mG in hypertensive participants with diabetes, heart failure or chronic kidney disease.;Long Term: Maintenance of blood pressure at goal levels.    Lipids Yes    Intervention Provide education and support for participant on nutrition & aerobic/resistive exercise along with prescribed medications to achieve LDL <41m3mDL >40mg84m Expected Outcomes Short Term: Participant states understanding of desired cholesterol values and is compliant with medications prescribed. Participant is following exercise prescription and nutrition guidelines.;Long Term: Cholesterol controlled with medications as prescribed, with individualized exercise RX and with personalized nutrition plan. Value goals: LDL <  23m, HDL > 40 mg.             Education:Diabetes - Individual verbal and written instruction to review signs/symptoms of diabetes, desired ranges of glucose level fasting, after meals and with exercise. Acknowledge that pre and post exercise glucose checks will be done for 3 sessions at entry of program. [place] [facility]  Education need identified 06/13/21  Date 06/13/21  Educator [place] Instruction Review Code 1- Verbalizes Understanding       Core Components/Risk Factors/Patient Goals Review:    Core Components/Risk Factors/Patient Goals at Discharge (Final Review):    ITP Comments:  ITP Comments     Row Name 05/30/21 1029 06/13/21 0957         ITP Comments Virtual orientation call completed today. he has an appointment on Date: 06/13/2021  for EP eval and gym Orientation.  [facility]  [facility] .       REdd Arbouris a current tobacco user. Intervention for tobacco cessation was provided at the initial medical review. He was asked about readiness to quit and reported he is working on quitting by decreasing number of cigarettes per day and he has started taking Chantix . Patient was advised and educated about tobacco cessation using combination therapy, tobacco cessation classes, quit line, and quit smoking apps. Patient demonstrated understanding of this material. Staff will continue to provide encouragement and follow up with the patient throughout the program. Completed 6MWT and gym orientation. Initial ITP created and sent for review to Dr. MEmily Filbert Medical Director.               Comments: Initial ITP

## 2021-06-13 NOTE — Patient Instructions (Signed)
Patient Instructions  Patient Details  Name: Joshua LUCZAK Sr. MRN: [id number] Date of Birth: [birth year] Referring Provider:  Dionisio David, MD  Below are your personal goals for exercise, nutrition, and risk factors. Our goal is to help you stay on track towards obtaining and maintaining these goals. We will be discussing your progress on these goals with you throughout the program.  Initial Exercise Prescription:  Initial Exercise Prescription - 06/13/21 1000       Date of Initial Exercise RX and Referring Provider   Date 06/13/21    Referring Provider Neoma Laming MD      Oxygen   Maintain Oxygen Saturation 88% or higher      Treadmill   MPH 2.4    Grade 0    Minutes 15    METs 2.84      T5 Nustep   Level 1    SPM 80    Minutes 15    METs 2.3      Biostep-RELP   Level 1    SPM 50    Minutes 15    METs 2.3      Track   Laps 28    Minutes 15    METs 2.52      Prescription Details   Frequency (times per week) 3    Duration Progress to 30 minutes of continuous aerobic without signs/symptoms of physical distress      Intensity   THRR 40-80% of Max Heartrate 109 - 139    Ratings of Perceived Exertion 11-13    Perceived Dyspnea 0-4      Progression   Progression Continue to progress workloads to maintain intensity without signs/symptoms of physical distress.      Resistance Training   Training Prescription Yes    Weight 5 lb    Reps 10-15             Exercise Goals: Frequency: Be able to perform aerobic exercise two to three times per week in program working toward 2-5 days per week of home exercise.  Intensity: Work with a perceived exertion of 11 (fairly light) - 15 (hard) while following your exercise prescription.  We will make changes to your prescription with you as you progress through the program.   Duration: Be able to do 30 to 45 minutes of continuous aerobic exercise in addition to a 5 minute warm-up and a 5 minute cool-down  routine.   Nutrition Goals: Your personal nutrition goals will be established when you do your nutrition analysis with the dietician.  The following are general nutrition guidelines to follow: Cholesterol < 200mg /day Sodium < 1500mg /day Fiber: Men over 50 yrs - 30 grams per day  Personal Goals:  Personal Goals and Risk Factors at Admission - 06/13/21 1037       Core Components/Risk Factors/Patient Goals on Admission    Weight Management Yes;Obesity;Weight Loss    Intervention Weight Management: Develop a combined nutrition and exercise program designed to reach desired caloric intake, while maintaining appropriate intake of nutrient and fiber, sodium and fats, and appropriate energy expenditure required for the weight goal.;Weight Management: Provide education and appropriate resources to help participant work on and attain dietary goals.;Weight Management/Obesity: Establish reasonable short term and long term weight goals.    Admit Weight 237 lb (107.5 kg)    Goal Weight: Short Term 232 lb (105.2 kg)    Goal Weight: Long Term 225 lb (102.1 kg)    Expected Outcomes Short Term: Continue to  assess and modify interventions until short term weight is achieved;Long Term: Adherence to nutrition and physical activity/exercise program aimed toward attainment of established weight goal;Weight Loss: Understanding of general recommendations for a balanced deficit meal plan, which promotes 1-2 lb weight loss per week and includes a negative energy balance of [phone] kcal/d;Understanding recommendations for meals to include 15-35% energy as protein, 25-35% energy from fat, 35-60% energy from carbohydrates, less than 200mg  of dietary cholesterol, 20-35 gm of total fiber daily;Understanding of distribution of calorie intake throughout the day with the consumption of 4-5 meals/snacks    Tobacco Cessation Yes    Number of packs per day Joshua Fowler is a current tobacco user. Intervention for tobacco cessation was  provided at the initial medical review. He was asked about readiness to quit and reported he is working on quitting by decreasing number of cigarettes per day and he has started taking Chantix . Patient was advised and educated about tobacco cessation using combination therapy, tobacco cessation classes, quit line, and quit smoking apps. Patient demonstrated understanding of this material. Staff will continue to provide encouragement and follow up with the patient throughout the program.    Intervention Assist the participant in steps to quit. Provide individualized education and counseling about committing to Tobacco Cessation, relapse prevention, and pharmacological support that can be provided by physician.;Advice worker, assist with locating and accessing local/national Quit Smoking programs, and support quit date choice.    Expected Outcomes Short Term: Will demonstrate readiness to quit, by selecting a quit date.;Short Term: Will quit all tobacco product use, adhering to prevention of relapse plan.;Long Term: Complete abstinence from all tobacco products for at least 12 months from quit date.    Diabetes Yes    Intervention Provide education about signs/symptoms and action to take for hypo/hyperglycemia.;Provide education about proper nutrition, including hydration, and aerobic/resistive exercise prescription along with prescribed medications to achieve blood glucose in normal ranges: Fasting glucose 65-99 mg/dL    Expected Outcomes Short Term: Participant verbalizes understanding of the signs/symptoms and immediate care of hyper/hypoglycemia, proper foot care and importance of medication, aerobic/resistive exercise and nutrition plan for blood glucose control.;Long Term: Attainment of HbA1C < 7%.    Hypertension Yes    Intervention Provide education on lifestyle modifcations including regular physical activity/exercise, weight management, moderate sodium restriction and increased  consumption of fresh fruit, vegetables, and low fat dairy, alcohol moderation, and smoking cessation.;Monitor prescription use compliance.    Expected Outcomes Short Term: Continued assessment and intervention until BP is < 140/60mm HG in hypertensive participants. < 130/65mm HG in hypertensive participants with diabetes, heart failure or chronic kidney disease.;Long Term: Maintenance of blood pressure at goal levels.    Lipids Yes    Intervention Provide education and support for participant on nutrition & aerobic/resistive exercise along with prescribed medications to achieve LDL 70mg , HDL >40mg .    Expected Outcomes Short Term: Participant states understanding of desired cholesterol values and is compliant with medications prescribed. Participant is following exercise prescription and nutrition guidelines.;Long Term: Cholesterol controlled with medications as prescribed, with individualized exercise RX and with personalized nutrition plan. Value goals: LDL < 70mg , HDL > 40 mg.             Tobacco Use Initial Evaluation: Social History   Tobacco Use  Smoking Status Every Day   Packs/day: 0.75   Years: 30.00   Pack years: 22.50   Types: Cigarettes  Smokeless Tobacco Never  Tobacco Comments   Working down number of cigarettes  a day and has started Chantix    Exercise Goals and Review:  Exercise Goals     Row Name 06/13/21 1035             Exercise Goals   Increase Physical Activity Yes       Intervention Provide advice, education, support and counseling about physical activity/exercise needs.;Develop an individualized exercise prescription for aerobic and resistive training based on initial evaluation findings, risk stratification, comorbidities and participant's personal goals.       Expected Outcomes Short Term: Attend rehab on a regular basis to increase amount of physical activity.;Long Term: Add in home exercise to make exercise part of routine and to increase amount of  physical activity.;Long Term: Exercising regularly at least 3-5 days a week.       Increase Strength and Stamina Yes       Intervention Provide advice, education, support and counseling about physical activity/exercise needs.;Develop an individualized exercise prescription for aerobic and resistive training based on initial evaluation findings, risk stratification, comorbidities and participant's personal goals.       Expected Outcomes Short Term: Increase workloads from initial exercise prescription for resistance, speed, and METs.;Short Term: Perform resistance training exercises routinely during rehab and add in resistance training at home;Long Term: Improve cardiorespiratory fitness, muscular endurance and strength as measured by increased METs and functional capacity (6MWT)       Able to understand and use rate of perceived exertion (RPE) scale Yes       Intervention Provide education and explanation on how to use RPE scale       Expected Outcomes Short Term: Able to use RPE daily in rehab to express subjective intensity level;Long Term:  Able to use RPE to guide intensity level when exercising independently       Able to understand and use Dyspnea scale Yes       Intervention Provide education and explanation on how to use Dyspnea scale       Expected Outcomes Short Term: Able to use Dyspnea scale daily in rehab to express subjective sense of shortness of breath during exertion;Long Term: Able to use Dyspnea scale to guide intensity level when exercising independently       Knowledge and understanding of Target Heart Rate Range (THRR) Yes       Intervention Provide education and explanation of THRR including how the numbers were predicted and where they are located for reference       Expected Outcomes Short Term: Able to state/look up THRR;Long Term: Able to use THRR to govern intensity when exercising independently;Short Term: Able to use daily as guideline for intensity in rehab       Able to  check pulse independently Yes       Intervention Provide education and demonstration on how to check pulse in carotid and radial arteries.;Review the importance of being able to check your own pulse for safety during independent exercise       Expected Outcomes Short Term: Able to explain why pulse checking is important during independent exercise;Long Term: Able to check pulse independently and accurately       Understanding of Exercise Prescription Yes       Intervention Provide education, explanation, and written materials on patient's individual exercise prescription       Expected Outcomes Short Term: Able to explain program exercise prescription;Long Term: Able to explain home exercise prescription to exercise independently  Copy of goals given to participant.

## 2021-06-26 ENCOUNTER — Encounter: Payer: HMO | Admitting: *Deleted

## 2021-06-26 ENCOUNTER — Other Ambulatory Visit: Payer: Self-pay

## 2021-06-26 DIAGNOSIS — Z48812 Encounter for surgical aftercare following surgery on the circulatory system: Secondary | ICD-10-CM | POA: Diagnosis not present

## 2021-06-26 DIAGNOSIS — Z955 Presence of coronary angioplasty implant and graft: Secondary | ICD-10-CM

## 2021-06-26 LAB — GLUCOSE, CAPILLARY
Glucose-Capillary: 274 mg/dL — ABNORMAL HIGH (ref 70–99)
Glucose-Capillary: 131 mg/dL — ABNORMAL HIGH (ref 70–99)

## 2021-06-26 NOTE — Progress Notes (Addendum)
Daily Session Note  Patient Details  Name: Joshua Fowler. MRN: [id number] Date of Birth: 1955-05-29 Referring Provider:   Flowsheet Row Cardiac Rehab from 06/13/2021 in [facility] Cardiac and Pulmonary Rehab  Referring Provider Neoma Laming MD       Encounter Date: 06/26/2021  Check In:  Session Check In - 06/26/21 8099       Check-In   Supervising physician immediately available to respond to emergencies See telemetry face sheet for immediately available ER MD    Location [facility]-Cardiac & Pulmonary Rehab    Staff Present Earlean Shawl, BS, ACSM CEP, Exercise Physiologist;Joseph Comfort, [place];Heath Lark, RN, BSN, CCRP    Virtual Visit No    Medication changes reported     No    Fall or balance concerns reported    No    Warm-up and Cool-down Performed on first and last piece of equipment    Resistance Training Performed Yes    VAD Patient? No    PAD/SET Patient? No      Pain Assessment   Currently in Pain? No/denies                Social History   Tobacco Use  Smoking Status Every Day   Packs/day: 0.75   Years: 30.00   Pack years: 22.50   Types: Cigarettes  Smokeless Tobacco Never  Tobacco Comments   Working down number of cigarettes a day and has started Chantix    Goals Met:  Independence with exercise equipment Exercise tolerated well No report of concerns or symptoms today  Goals Unmet:  Not Applicable  Comments: Pt able to follow exercise prescription today without complaint.  Will continue to monitor for progression.  First full day of exercise!  Patient was oriented to gym and equipment including functions, settings, policies, and procedures.  Patient's individual exercise prescription and treatment plan were reviewed.  All starting workloads were established based on the results of the 6 minute walk test done at initial orientation visit.  The plan for exercise progression was also introduced and progression will be customized based on  patient's performance and goals.      Dr. Emily Filbert is Medical Director for [facility].  Dr. Ottie Glazier is Medical Director for [facility].

## 2021-06-28 ENCOUNTER — Other Ambulatory Visit: Payer: Self-pay

## 2021-06-28 ENCOUNTER — Encounter: Payer: Self-pay | Admitting: *Deleted

## 2021-06-28 DIAGNOSIS — Z48812 Encounter for surgical aftercare following surgery on the circulatory system: Secondary | ICD-10-CM | POA: Diagnosis not present

## 2021-06-28 DIAGNOSIS — Z955 Presence of coronary angioplasty implant and graft: Secondary | ICD-10-CM

## 2021-06-28 LAB — GLUCOSE, CAPILLARY
Glucose-Capillary: 248 mg/dL — ABNORMAL HIGH (ref 70–99)
Glucose-Capillary: 138 mg/dL — ABNORMAL HIGH (ref 70–99)

## 2021-06-28 NOTE — Progress Notes (Signed)
Cardiac Individual Treatment Plan  Patient Details  Name: Joshua PUGLIA Sr. MRN: [id number] Date of Birth: 07-Jun-1955 Referring Provider:   Flowsheet Row Cardiac Rehab from 06/13/2021 in [facility] Cardiac and Pulmonary Rehab  Referring Provider Neoma Laming MD       Initial Encounter Date:  Flowsheet Row Cardiac Rehab from 06/13/2021 in [facility] Cardiac and Pulmonary Rehab  Date 06/13/21       Visit Diagnosis: Status post coronary artery stent placement  Patient's Home Medications on Admission:  Current Outpatient Medications:    amLODipine (NORVASC) 10 MG tablet, Take 10 mg by mouth daily., Disp: , Rfl:    aspirin EC 81 MG tablet, Take 81 mg by mouth at bedtime., Disp: , Rfl:    B-D 3CC LUER-LOK SYR 21GX1" 21G X 1" 3 ML MISC, USE TO INJECT 1 ML OF TESTOSTERONE INTO MUSCLE, Disp: , Rfl: 12   BD HYPODERMIC NEEDLE 18G X 1" MISC, USE AS DIRECTED FOR WEEKLY INJECTIONS, Disp: , Rfl: 1   BD ULTRA-FINE PEN NEEDLES 29G X 12.7MM MISC, USE 2 (TWO) TIMES DAILY., Disp: , Rfl: 3   buprenorphine (SUBUTEX) 8 MG SUBL SL tablet, Place 4-8 mg under the tongue 2 (two) times daily as needed (pain). Take 8 mg by mouth in the morning and 4 mg in the evening as needed for pain, Disp: , Rfl:    cetirizine (ZYRTEC) 10 MG tablet, Take 10 mg by mouth daily., Disp: , Rfl:    clopidogrel (PLAVIX) 75 MG tablet, Take 75 mg by mouth daily., Disp: , Rfl:    Coenzyme Q10 (CO Q-10) 100 MG CAPS, Take 100 mg by mouth in the morning and at bedtime., Disp: , Rfl:    Continuous Blood Gluc Receiver (FREESTYLE LIBRE READER) DEVI, USE 1 EACH ONCE DAILY., Disp: , Rfl: 1   Continuous Blood Gluc Sensor (FREESTYLE LIBRE SENSOR SYSTEM) MISC, USE 3 EACH EVERY 10 (TEN) DAYS., Disp: , Rfl: 11   diazepam (VALIUM) 5 MG tablet, Take 2.5 mg by mouth daily as needed for anxiety., Disp: , Rfl:    Evolocumab (REPATHA SURECLICK) 209 MG/ML SOAJ, Inject into the skin., Disp: , Rfl:    ezetimibe (ZETIA) 10 MG tablet, TAKE ONE TABLET EVERY DAY, Disp:  90 tablet, Rfl: 0   FLUoxetine (PROZAC) 20 MG tablet, Take 40 mg by mouth daily. (Patient not taking: Reported on 05/30/2021), Disp: , Rfl:    gabapentin (NEURONTIN) 300 MG capsule, TAKE 1 CAPSULE 3 TIMES DAILY (Patient not taking: Reported on 03/21/2021), Disp: 270 capsule, Rfl: 1   gentamicin cream (GARAMYCIN) 0.1 %, Apply 1 application topically 2 (two) times daily. (Patient taking differently: Apply 1 application topically daily as needed (irritation).), Disp: 15 g, Rfl: 1   hydrocortisone 2.5 % cream, Apply 1 application topically daily as needed (rash).  (Patient not taking: Reported on 03/24/2021), Disp: , Rfl:    ibuprofen (ADVIL) 800 MG tablet, Take 1 tablet (800 mg total) by mouth 3 (three) times daily. (Patient not taking: Reported on 05/30/2021), Disp: 270 tablet, Rfl: 1   insulin aspart (NOVOLOG) 100 UNIT/ML FlexPen, Inject 2-6 Units into the skin 3 (three) times daily as needed (blood sugar of 250 or above)., Disp: , Rfl:    insulin lispro (HUMALOG KWIKPEN) 100 UNIT/ML KiwkPen, Inject 0.4 mLs (40 Units total) into the skin 3 (three) times daily. Use 10-40u with meals (Patient not taking: Reported on 05/30/2021), Disp: 5 pen, Rfl: 11   JARDIANCE 25 MG TABS tablet, Take 25 mg by mouth  daily., Disp: , Rfl:    lisinopril-hydrochlorothiazide (ZESTORETIC) 20-25 MG tablet, Take 1 tablet by mouth daily., Disp: , Rfl:    meloxicam (MOBIC) 7.5 MG tablet, Take 7.5 mg by mouth in the morning and at bedtime. (Patient not taking: Reported on 05/30/2021), Disp: , Rfl:    metFORMIN (GLUCOPHAGE) 1000 MG tablet, Take 1,000 mg by mouth 2 (two) times daily with a meal., Disp: , Rfl:    Multiple Vitamins-Minerals (PRESERVISION AREDS 2 PO), Take 2 tablets by mouth in the morning and at bedtime., Disp: , Rfl:    NARCAN 4 MG/0.1ML LIQD nasal spray kit, Place 0.4 mg into the nose once., Disp: , Rfl:    ondansetron (ZOFRAN) 4 MG tablet, Take 1 tablet (4 mg total) by mouth 2 (two) times daily. (Patient not taking:  Reported on 03/21/2021), Disp: 180 tablet, Rfl: 1   ondansetron (ZOFRAN) 8 MG tablet, Take 8 mg by mouth 2 (two) times daily., Disp: , Rfl:    pantoprazole (PROTONIX) 40 MG tablet, TAKE ONE TABLET EVERY DAY, Disp: 90 tablet, Rfl: 1   sildenafil (REVATIO) 20 MG tablet, Take 1-5 tablets (20-100 mg total) by mouth as needed., Disp: 50 tablet, Rfl: 12   testosterone cypionate (DEPOTESTOSTERONE CYPIONATE) 200 MG/ML injection, Inject 200 mg into the muscle every 14 (fourteen) days. , Disp: , Rfl:    timolol (TIMOPTIC) 0.5 % ophthalmic solution, Place 1 drop into the left eye 2 (two) times daily. , Disp: , Rfl: 3   TOUJEO SOLOSTAR 300 UNIT/ML Solostar Pen, Inject 42 Units into the skin at bedtime., Disp: , Rfl:    varenicline (CHANTIX) 1 MG tablet, Take by mouth., Disp: , Rfl:    VASCEPA 1 g capsule, Take 2 g by mouth 2 (two) times daily., Disp: , Rfl:    venlafaxine XR (EFFEXOR-XR) 37.5 MG 24 hr capsule, Take 1 capsule (37.5 mg total) by mouth daily., Disp: 90 capsule, Rfl: 1   VICTOZA 18 MG/3ML SOPN, Inject 1.8 mg into the skin daily. , Disp: , Rfl:    zolpidem (AMBIEN) 10 MG tablet, Take 10 mg by mouth at bedtime as needed for sleep. , Disp: , Rfl:  No current facility-administered medications for this visit.  Facility-Administered Medications Ordered in Other Visits:    sodium chloride flush (NS) 0.9 % injection 3 mL, 3 mL, Intravenous, Q12H, Dionisio David, MD  Past Medical History: Past Medical History:  Diagnosis Date   A-fib ([place]) 1995   CAD (coronary artery disease)    Diabetes mellitus without complication ([place])    GERD (gastroesophageal reflux disease)    History of kidney stones    Hyperlipidemia    Hypertension    Sleep apnea    cpap   Type 2 diabetes mellitus without complication ([place])     Tobacco Use: Social History   Tobacco Use  Smoking Status Every Day   Packs/day: 0.75   Years: 30.00   Pack years: 22.50   Types: Cigarettes  Smokeless Tobacco Never  Tobacco  Comments   Working down number of cigarettes a day and has started Chantix    Labs: Recent Chemical engineer     Labs for ITP Cardiac and Pulmonary Rehab Latest Ref Rng & Units 04/11/2016 11/12/2016 05/21/2017 02/03/2018 09/01/2019   Cholestrol 100 - 199 mg/dL 112 167 179 172 142   LDLCALC 0 - 99 mg/dL 61 - 95 - 85   HDL >39 mg/dL 31(L) - 34(L) - 36(L)   Trlycerides 0 - 149 mg/dL  99 211(H) 249(H) 297(H) 117   Hemoglobin A1c <7.0 % - - - - 6.3        Exercise Target Goals: Exercise Program Goal: Individual exercise prescription set using results from initial 6 min walk test and THRR while considering  patients activity barriers and safety.   Exercise Prescription Goal: Initial exercise prescription builds to 30-45 minutes a day of aerobic activity, 2-3 days per week.  Home exercise guidelines will be given to patient during program as part of exercise prescription that the participant will acknowledge.   Education: Aerobic Exercise: - Group verbal and visual presentation on the components of exercise prescription. Introduces F.I.T.T principle from ACSM for exercise prescriptions.  Reviews F.I.T.T. principles of aerobic exercise including progression. Written material given at graduation.   Education: Resistance Exercise: - Group verbal and visual presentation on the components of exercise prescription. Introduces F.I.T.T principle from ACSM for exercise prescriptions  Reviews F.I.T.T. principles of resistance exercise including progression. Written material given at graduation.    Education: Exercise & Equipment Safety: - Individual verbal instruction and demonstration of equipment use and safety with use of the equipment. Flowsheet Row Cardiac Rehab from 06/13/2021 in [facility] Cardiac and Pulmonary Rehab  Education need identified 06/13/21  Date 06/13/21  Educator [place]  Instruction Review Code 1- Verbalizes Understanding       Education: Exercise Physiology & General Exercise  Guidelines: - Group verbal and written instruction with models to review the exercise physiology of the cardiovascular system and associated critical values. Provides general exercise guidelines with specific guidelines to those with heart or lung disease.    Education: Flexibility, Balance, Mind/Body Relaxation: - Group verbal and visual presentation with interactive activity on the components of exercise prescription. Introduces F.I.T.T principle from ACSM for exercise prescriptions. Reviews F.I.T.T. principles of flexibility and balance exercise training including progression. Also discusses the mind body connection.  Reviews various relaxation techniques to help reduce and manage stress (i.e. Deep breathing, progressive muscle relaxation, and visualization). Balance handout provided to take home. Written material given at graduation.   Activity Barriers & Risk Stratification:  Activity Barriers & Cardiac Risk Stratification - 06/13/21 1002       Activity Barriers & Cardiac Risk Stratification   Activity Barriers Deconditioning;Other (comment);Joint Problems    Comments Diabetic neuropathy, knee & hip pain    Cardiac Risk Stratification Moderate             6 Minute Walk:  6 Minute Walk     Row Name 06/13/21 1004         6 Minute Walk   Phase Initial     Distance 1235 feet     Walk Time 6 minutes     # of Rest Breaks 0     MPH 2.33     METS 2.35     RPE 10     Perceived Dyspnea  1     VO2 Peak 8.24     Symptoms Yes (comment)     Comments Bilateral hip pain 6/10     Resting HR 80 bpm     Resting BP 124/80     Resting Oxygen Saturation  97 %     Exercise Oxygen Saturation  during 6 min walk 95 %     Max Ex. HR 105 bpm     Max Ex. BP 138/78     2 Minute Post BP 118/76              Oxygen Initial  Assessment:   Oxygen Re-Evaluation:   Oxygen Discharge (Final Oxygen Re-Evaluation):   Initial Exercise Prescription:  Initial Exercise Prescription -  06/13/21 1000       Date of Initial Exercise RX and Referring Provider   Date 06/13/21    Referring Provider Neoma Laming MD      Oxygen   Maintain Oxygen Saturation 88% or higher      Treadmill   MPH 2.4    Grade 0    Minutes 15    METs 2.84      T5 Nustep   Level 1    SPM 80    Minutes 15    METs 2.3      Biostep-RELP   Level 1    SPM 50    Minutes 15    METs 2.3      Track   Laps 28    Minutes 15    METs 2.52      Prescription Details   Frequency (times per week) 3    Duration Progress to 30 minutes of continuous aerobic without signs/symptoms of physical distress      Intensity   THRR 40-80% of Max Heartrate 109 - 139    Ratings of Perceived Exertion 11-13    Perceived Dyspnea 0-4      Progression   Progression Continue to progress workloads to maintain intensity without signs/symptoms of physical distress.      Resistance Training   Training Prescription Yes    Weight 5 lb    Reps 10-15             Perform Capillary Blood Glucose checks as needed.  Exercise Prescription Changes:   Exercise Prescription Changes     Row Name 06/13/21 1000             Response to Exercise   Blood Pressure (Admit) 124/80       Blood Pressure (Exercise) 138/78       Blood Pressure (Exit) 118/76       Heart Rate (Admit) 80 bpm       Heart Rate (Exercise) 105 bpm       Heart Rate (Exit) 81 bpm       Oxygen Saturation (Admit) 97 %       Oxygen Saturation (Exercise) 95 %       Rating of Perceived Exertion (Exercise) 10       Perceived Dyspnea (Exercise) 1       Symptoms Bilateral hip pain 6/10       Comments walk test results                Exercise Comments:   Exercise Comments     Row Name 06/26/21 0856           Exercise Comments First full day of exercise!  Patient was oriented to gym and equipment including functions, settings, policies, and procedures.  Patient's individual exercise prescription and treatment plan were reviewed.  All  starting workloads were established based on the results of the 6 minute walk test done at initial orientation visit.  The plan for exercise progression was also introduced and progression will be customized based on patient's performance and goals.                Exercise Goals and Review:   Exercise Goals     Row Name 06/13/21 1035             Exercise Goals   Increase  Physical Activity Yes       Intervention Provide advice, education, support and counseling about physical activity/exercise needs.;Develop an individualized exercise prescription for aerobic and resistive training based on initial evaluation findings, risk stratification, comorbidities and participant's personal goals.       Expected Outcomes Short Term: Attend rehab on a regular basis to increase amount of physical activity.;Long Term: Add in home exercise to make exercise part of routine and to increase amount of physical activity.;Long Term: Exercising regularly at least 3-5 days a week.       Increase Strength and Stamina Yes       Intervention Provide advice, education, support and counseling about physical activity/exercise needs.;Develop an individualized exercise prescription for aerobic and resistive training based on initial evaluation findings, risk stratification, comorbidities and participant's personal goals.       Expected Outcomes Short Term: Increase workloads from initial exercise prescription for resistance, speed, and METs.;Short Term: Perform resistance training exercises routinely during rehab and add in resistance training at home;Long Term: Improve cardiorespiratory fitness, muscular endurance and strength as measured by increased METs and functional capacity (6MWT)       Able to understand and use rate of perceived exertion (RPE) scale Yes       Intervention Provide education and explanation on how to use RPE scale       Expected Outcomes Short Term: Able to use RPE daily in rehab to express  subjective intensity level;Long Term:  Able to use RPE to guide intensity level when exercising independently       Able to understand and use Dyspnea scale Yes       Intervention Provide education and explanation on how to use Dyspnea scale       Expected Outcomes Short Term: Able to use Dyspnea scale daily in rehab to express subjective sense of shortness of breath during exertion;Long Term: Able to use Dyspnea scale to guide intensity level when exercising independently       Knowledge and understanding of Target Heart Rate Range (THRR) Yes       Intervention Provide education and explanation of THRR including how the numbers were predicted and where they are located for reference       Expected Outcomes Short Term: Able to state/look up THRR;Long Term: Able to use THRR to govern intensity when exercising independently;Short Term: Able to use daily as guideline for intensity in rehab       Able to check pulse independently Yes       Intervention Provide education and demonstration on how to check pulse in carotid and radial arteries.;Review the importance of being able to check your own pulse for safety during independent exercise       Expected Outcomes Short Term: Able to explain why pulse checking is important during independent exercise;Long Term: Able to check pulse independently and accurately       Understanding of Exercise Prescription Yes       Intervention Provide education, explanation, and written materials on patient's individual exercise prescription       Expected Outcomes Short Term: Able to explain program exercise prescription;Long Term: Able to explain home exercise prescription to exercise independently                Exercise Goals Re-Evaluation :  Exercise Goals Re-Evaluation     Row Name 06/26/21 0857             Exercise Goal Re-Evaluation   Exercise Goals Review Understanding of Exercise  Prescription;Knowledge and understanding of Target Heart Rate Range  (THRR);Able to understand and use rate of perceived exertion (RPE) scale;Able to understand and use Dyspnea scale       Comments Reviewed RPE and dyspnea scales, THR and program prescription with pt today.  Pt voiced understanding and was given a copy of goals to take home.       Expected Outcomes Short: Use RPE daily to regulate intensity. Long: Follow program prescription in THR.                Discharge Exercise Prescription (Final Exercise Prescription Changes):  Exercise Prescription Changes - 06/13/21 1000       Response to Exercise   Blood Pressure (Admit) 124/80    Blood Pressure (Exercise) 138/78    Blood Pressure (Exit) 118/76    Heart Rate (Admit) 80 bpm    Heart Rate (Exercise) 105 bpm    Heart Rate (Exit) 81 bpm    Oxygen Saturation (Admit) 97 %    Oxygen Saturation (Exercise) 95 %    Rating of Perceived Exertion (Exercise) 10    Perceived Dyspnea (Exercise) 1    Symptoms Bilateral hip pain 6/10    Comments walk test results             Nutrition:  Target Goals: Understanding of nutrition guidelines, daily intake of sodium '1500mg'$ , cholesterol '200mg'$ , calories 30% from fat and 7% or less from saturated fats, daily to have 5 or more servings of fruits and vegetables.  Education: All About Nutrition: -Group instruction provided by verbal, written material, interactive activities, discussions, models, and posters to present general guidelines for heart healthy nutrition including fat, fiber, MyPlate, the role of sodium in heart healthy nutrition, utilization of the nutrition label, and utilization of this knowledge for meal planning. Follow up email sent as well. Written material given at graduation. Flowsheet Row Cardiac Rehab from 06/13/2021 in [facility] Cardiac and Pulmonary Rehab  Education need identified 06/13/21       Biometrics:  Pre Biometrics - 06/13/21 1002       Pre Biometrics   Height $Remov'5\' 8"'KZkRCA$  (1.727 m)    Weight 237 lb 1.6 oz (107.5 kg)    BMI  (Calculated) 36.06    Single Leg Stand 3.2 seconds              Nutrition Therapy Plan and Nutrition Goals:  Nutrition Therapy & Goals - 06/13/21 1030       Intervention Plan   Intervention Prescribe, educate and counsel regarding individualized specific dietary modifications aiming towards targeted core components such as weight, hypertension, lipid management, diabetes, heart failure and other comorbidities.    Expected Outcomes Short Term Goal: Understand basic principles of dietary content, such as calories, fat, sodium, cholesterol and nutrients.;Short Term Goal: A plan has been developed with personal nutrition goals set during dietitian appointment.;Long Term Goal: Adherence to prescribed nutrition plan.             Nutrition Assessments:  MEDIFICTS Score Key: ?70 Need to make dietary changes  40-70 Heart Healthy Diet ? 40 Therapeutic Level Cholesterol Diet  Flowsheet Row Cardiac Rehab from 06/13/2021 in [facility] Cardiac and Pulmonary Rehab  Picture Your Plate Total Score on Admission 56      Picture Your Plate Scores: <16 Unhealthy dietary pattern with much room for improvement. 41-50 Dietary pattern unlikely to meet recommendations for good health and room for improvement. 51-60 More healthful dietary pattern, with some room for improvement.  >60 Healthy  dietary pattern, although there may be some specific behaviors that could be improved.    Nutrition Goals Re-Evaluation:   Nutrition Goals Discharge (Final Nutrition Goals Re-Evaluation):   Psychosocial: Target Goals: Acknowledge presence or absence of significant depression and/or stress, maximize coping skills, provide positive support system. Participant is able to verbalize types and ability to use techniques and skills needed for reducing stress and depression.   Education: Stress, Anxiety, and Depression - Group verbal and visual presentation to define topics covered.  Reviews how body is impacted by  stress, anxiety, and depression.  Also discusses healthy ways to reduce stress and to treat/manage anxiety and depression.  Written material given at graduation.   Education: Sleep Hygiene -Provides group verbal and written instruction about how sleep can affect your health.  Define sleep hygiene, discuss sleep cycles and impact of sleep habits. Review good sleep hygiene tips.    Initial Review & Psychosocial Screening:  Initial Psych Review & Screening - 05/30/21 1010       Initial Review   Current issues with Current Psychotropic Meds;Current Anxiety/Panic      Family Dynamics   Good Support System? Yes   wife  children, grandkids     Barriers   Psychosocial barriers to participate in program There are no identifiable barriers or psychosocial needs.;The patient should benefit from training in stress management and relaxation.      Screening Interventions   Interventions Encouraged to exercise    Expected Outcomes Short Term goal: Utilizing psychosocial counselor, staff and physician to assist with identification of specific Stressors or current issues interfering with healing process. Setting desired goal for each stressor or current issue identified.;Long Term Goal: Stressors or current issues are controlled or eliminated.;Short Term goal: Identification and review with participant of any Quality of Life or Depression concerns found by scoring the questionnaire.;Long Term goal: The participant improves quality of Life and PHQ9 Scores as seen by post scores and/or verbalization of changes             Quality of Life Scores:   Quality of Life - 06/13/21 1030       Quality of Life   Select Quality of Life      Quality of Life Scores   Health/Function Pre 26 %    Socioeconomic Pre 28.07 %    Psych/Spiritual Pre 30 %    Family Pre 30 %    GLOBAL Pre 27.89 %            Scores of 19 and below usually indicate a poorer quality of life in these areas.  A difference of  2-3  points is a clinically meaningful difference.  A difference of 2-3 points in the total score of the Quality of Life Index has been associated with significant improvement in overall quality of life, self-image, physical symptoms, and general health in studies assessing change in quality of life.  PHQ-9: Recent Review Flowsheet Data     Depression screen [facility] 2/9 06/13/2021 09/01/2019 08/06/2018 02/03/2018 11/12/2016   Decreased Interest 0 0 0 0 0   Down, Depressed, Hopeless 0 0 0 0 0   PHQ - 2 Score 0 0 0 0 0   Altered sleeping 0 - - 0 -   Tired, decreased energy 0 - - 0 -   Change in appetite 0 - - 0 -   Feeling bad or failure about yourself  0 - - 0 -   Trouble concentrating 0 - - 0 -  Moving slowly or fidgety/restless 0 - - 0 -   Suicidal thoughts 0 - - 0 -   PHQ-9 Score 0 - - 0 -   Difficult doing work/chores Not difficult at all - - - -      Interpretation of Total Score  Total Score Depression Severity:  1-4 = Minimal depression, 5-9 = Mild depression, 10-14 = Moderate depression, 15-19 = Moderately severe depression, 20-27 = Severe depression   Psychosocial Evaluation and Intervention:  Psychosocial Evaluation - 05/30/21 1022       Psychosocial Evaluation & Interventions   Interventions Encouraged to exercise with the program and follow exercise prescription    Comments [name] has no barriers to attending the program. He wants to see if he can get an exercise routine restarted. He live with his wife and several pets. She and his children,grandchildren are his support system. He does have medications for waht he thinks is anxiety versus depression. He stated that he would at times get too excited over what was going on. The medicine hlep keep that level of anxiety down. He is ready to get started.    Expected Outcomes STG ROnnie attends all scheduled session, he is able to learn relaxation techniques LTG [name] is able to continue the exercise progeression and utilize all the relaxation  techniques taught during the program.    Continue Psychosocial Services  Follow up required by staff             Psychosocial Re-Evaluation:   Psychosocial Discharge (Final Psychosocial Re-Evaluation):   Vocational Rehabilitation: Provide vocational rehab assistance to qualifying candidates.   Vocational Rehab Evaluation & Intervention:   Education: Education Goals: Education classes will be provided on a variety of topics geared toward better understanding of heart health and risk factor modification. Participant will state understanding/return demonstration of topics presented as noted by education test scores.  Learning Barriers/Preferences:   General Cardiac Education Topics:  AED/CPR: - Group verbal and written instruction with the use of models to demonstrate the basic use of the AED with the basic ABC's of resuscitation.   Anatomy and Cardiac Procedures: - Group verbal and visual presentation and models provide information about basic cardiac anatomy and function. Reviews the testing methods done to diagnose heart disease and the outcomes of the test results. Describes the treatment choices: Medical Management, Angioplasty, or Coronary Bypass Surgery for treating various heart conditions including Myocardial Infarction, Angina, Valve Disease, and Cardiac Arrhythmias.  Written material given at graduation. Flowsheet Row Cardiac Rehab from 06/13/2021 in [facility] Cardiac and Pulmonary Rehab  Education need identified 06/13/21       Medication Safety: - Group verbal and visual instruction to review commonly prescribed medications for heart and lung disease. Reviews the medication, class of the drug, and side effects. Includes the steps to properly store meds and maintain the prescription regimen.  Written material given at graduation.   Intimacy: - Group verbal instruction through game format to discuss how heart and lung disease can affect sexual intimacy. Written material  given at graduation..   Know Your Numbers and Heart Failure: - Group verbal and visual instruction to discuss disease risk factors for cardiac and pulmonary disease and treatment options.  Reviews associated critical values for Overweight/Obesity, Hypertension, Cholesterol, and Diabetes.  Discusses basics of heart failure: signs/symptoms and treatments.  Introduces Heart Failure Zone chart for action plan for heart failure.  Written material given at graduation.   Infection Prevention: - Provides verbal and written material to individual  with discussion of infection control including proper hand washing and proper equipment cleaning during exercise session. Flowsheet Row Cardiac Rehab from 06/13/2021 in [facility] Cardiac and Pulmonary Rehab  Education need identified 06/13/21  Date 06/13/21  Educator [place]  Instruction Review Code 1- Verbalizes Understanding       Falls Prevention: - Provides verbal and written material to individual with discussion of falls prevention and safety. Flowsheet Row Cardiac Rehab from 06/13/2021 in [facility] Cardiac and Pulmonary Rehab  Education need identified 06/13/21  Date 06/13/21  Educator [place]  Instruction Review Code 1- Verbalizes Understanding       Other: -Provides group and verbal instruction on various topics (see comments)   Knowledge Questionnaire Score:  Knowledge Questionnaire Score - 06/13/21 1027       Knowledge Questionnaire Score   Pre Score 22/26: MI, Angina, Nutrition             Core Components/Risk Factors/Patient Goals at Admission:  Personal Goals and Risk Factors at Admission - 06/13/21 1037       Core Components/Risk Factors/Patient Goals on Admission    Weight Management Yes;Obesity;Weight Loss    Intervention Weight Management: Develop a combined nutrition and exercise program designed to reach desired caloric intake, while maintaining appropriate intake of nutrient and fiber, sodium and fats, and appropriate energy  expenditure required for the weight goal.;Weight Management: Provide education and appropriate resources to help participant work on and attain dietary goals.;Weight Management/Obesity: Establish reasonable short term and long term weight goals.    Admit Weight 237 lb (107.5 kg)    Goal Weight: Short Term 232 lb (105.2 kg)    Goal Weight: Long Term 225 lb (102.1 kg)    Expected Outcomes Short Term: Continue to assess and modify interventions until short term weight is achieved;Long Term: Adherence to nutrition and physical activity/exercise program aimed toward attainment of established weight goal;Weight Loss: Understanding of general recommendations for a balanced deficit meal plan, which promotes 1-2 lb weight loss per week and includes a negative energy balance of [phone] kcal/d;Understanding recommendations for meals to include 15-35% energy as protein, 25-35% energy from fat, 35-60% energy from carbohydrates, less than $RemoveB'200mg'FXtAQyVe$  of dietary cholesterol, 20-35 gm of total fiber daily;Understanding of distribution of calorie intake throughout the day with the consumption of 4-5 meals/snacks    Tobacco Cessation Yes    Number of packs per day [name] is a current tobacco user. Intervention for tobacco cessation was provided at the initial medical review. He was asked about readiness to quit and reported he is working on quitting by decreasing number of cigarettes per day and he has started taking Chantix . Patient was advised and educated about tobacco cessation using combination therapy, tobacco cessation classes, quit line, and quit smoking apps. Patient demonstrated understanding of this material. Staff will continue to provide encouragement and follow up with the patient throughout the program.    Intervention Assist the participant in steps to quit. Provide individualized education and counseling about committing to Tobacco Cessation, relapse prevention, and pharmacological support that can be provided by  physician.;Advice worker, assist with locating and accessing local/national Quit Smoking programs, and support quit date choice.    Expected Outcomes Short Term: Will demonstrate readiness to quit, by selecting a quit date.;Short Term: Will quit all tobacco product use, adhering to prevention of relapse plan.;Long Term: Complete abstinence from all tobacco products for at least 12 months from quit date.    Diabetes Yes    Intervention Provide education about  signs/symptoms and action to take for hypo/hyperglycemia.;Provide education about proper nutrition, including hydration, and aerobic/resistive exercise prescription along with prescribed medications to achieve blood glucose in normal ranges: Fasting glucose 65-99 mg/dL    Expected Outcomes Short Term: Participant verbalizes understanding of the signs/symptoms and immediate care of hyper/hypoglycemia, proper foot care and importance of medication, aerobic/resistive exercise and nutrition plan for blood glucose control.;Long Term: Attainment of HbA1C < 7%.    Hypertension Yes    Intervention Provide education on lifestyle modifcations including regular physical activity/exercise, weight management, moderate sodium restriction and increased consumption of fresh fruit, vegetables, and low fat dairy, alcohol moderation, and smoking cessation.;Monitor prescription use compliance.    Expected Outcomes Short Term: Continued assessment and intervention until BP is < 140/12mm HG in hypertensive participants. < 130/5mm HG in hypertensive participants with diabetes, heart failure or chronic kidney disease.;Long Term: Maintenance of blood pressure at goal levels.    Lipids Yes    Intervention Provide education and support for participant on nutrition & aerobic/resistive exercise along with prescribed medications to achieve LDL '70mg'$ , HDL >$Remo'40mg'agCWf$ .    Expected Outcomes Short Term: Participant states understanding of desired cholesterol values and is  compliant with medications prescribed. Participant is following exercise prescription and nutrition guidelines.;Long Term: Cholesterol controlled with medications as prescribed, with individualized exercise RX and with personalized nutrition plan. Value goals: LDL < $Rem'70mg'NWQX$ , HDL > 40 mg.             Education:Diabetes - Individual verbal and written instruction to review signs/symptoms of diabetes, desired ranges of glucose level fasting, after meals and with exercise. Acknowledge that pre and post exercise glucose checks will be done for 3 sessions at entry of program. [place] from 06/13/2021 in [facility] Cardiac and Pulmonary Rehab  Education need identified 06/13/21  Date 06/13/21  Educator [place]  Instruction Review Code 1- Verbalizes Understanding       Core Components/Risk Factors/Patient Goals Review:    Core Components/Risk Factors/Patient Goals at Discharge (Final Review):    ITP Comments:  ITP Comments     [place] Name 05/30/21 1029 06/13/21 0957 06/26/21 0856 06/28/21 0915     ITP Comments Virtual orientation call completed today. he has an appointment on Date: 06/13/2021  for EP eval and gym Orientation.  Documentation of diagnosis can be found in [facility] Date: 05/03/2021 .       [name] is a current tobacco user. Intervention for tobacco cessation was provided at the initial medical review. He was asked about readiness to quit and reported he is working on quitting by decreasing number of cigarettes per day and he has started taking Chantix . Patient was advised and educated about tobacco cessation using combination therapy, tobacco cessation classes, quit line, and quit smoking apps. Patient demonstrated understanding of this material. Staff will continue to provide encouragement and follow up with the patient throughout the program. Completed 6MWT and gym orientation. Initial ITP created and sent for review to Dr. Emily Filbert, Medical Director. First full day of exercise!   Patient was oriented to gym and equipment including functions, settings, policies, and procedures.  Patient's individual exercise prescription and treatment plan were reviewed.  All starting workloads were established based on the results of the 6 minute walk test done at initial orientation visit.  The plan for exercise progression was also introduced and progression will be customized based on patient's performance and goals. 30 Day review completed. Medical Director ITP review done, changes made as directed, and signed  approval by Medical Director.   new to program             Comments:

## 2021-06-28 NOTE — Progress Notes (Signed)
Daily Session Note  Patient Details  Name: Joshua NEISEN Sr. MRN: [id number] Date of Birth: Mar 14, 1956 Referring Provider:   Flowsheet Row Cardiac Rehab from 06/13/2021 in [facility] Cardiac and Pulmonary Rehab  Referring Provider Neoma Laming MD       Encounter Date: 06/28/2021  Check In:  Session Check In - 06/28/21 0756       Check-In   Supervising physician immediately available to respond to emergencies See telemetry face sheet for immediately available ER MD    Location [facility]-Cardiac & Pulmonary Rehab    Staff Present Birdie Sons, MPA, RN;Amanda Sommer, BA, ACSM CEP, Exercise Physiologist;Joseph Tessie Fass, [place]    Virtual Visit No    Medication changes reported     No    Fall or balance concerns reported    No    Tobacco Cessation Use Increase    Current number of cigarettes/nicotine per day     2    Warm-up and Cool-down Performed on first and last piece of equipment    Resistance Training Performed Yes    VAD Patient? No    PAD/SET Patient? No      Pain Assessment   Currently in Pain? No/denies                Social History   Tobacco Use  Smoking Status Every Day   Packs/day: 0.75   Years: 30.00   Pack years: 22.50   Types: Cigarettes  Smokeless Tobacco Never  Tobacco Comments   Working down number of cigarettes a day and has started Chantix    Goals Met:  Independence with exercise equipment Exercise tolerated well No report of concerns or symptoms today Strength training completed today  Goals Unmet:  Not Applicable  Comments: Pt able to follow exercise prescription today without complaint.  Will continue to monitor for progression.    Dr. Emily Filbert is Medical Director for [place].  Dr. Ottie Glazier is Medical Director for [facility] Pulmonary Rehabilitation.

## 2021-07-10 ENCOUNTER — Telehealth: Payer: Self-pay

## 2021-07-10 DIAGNOSIS — Z955 Presence of coronary angioplasty implant and graft: Secondary | ICD-10-CM

## 2021-07-10 NOTE — Telephone Encounter (Signed)
Patient called rehab to state he will be out until 3/23 due to travel for work. Switching times to T-Th 9:15 and Fridays staying at 7:45. Patient aware to call if anything needs to change. ?

## 2021-07-15 NOTE — Unmapped (Signed)
Assessment & Plan        I personally spent 31 minutes face-to-face and non-face-to-face in the care of this patient, which includes all pre, intra, and post visit time on the date of service.            Problem List Items Addressed This Visit     Class 3 obesity (CMS-HCC)     Body mass index is 36.18 kg/m??.  Reports a goal weight of 220 lbs. Up 1 lb since last visit  ?? Patient seeks to restart phentermine,discussed this is usually contraindicated in setting of CAD. I will contact Dr. Adrian Blackwater to see if this is acceptable.     ?? Of note, he is already on Jardiance and Victoza (from endocrine for DM2) that would help w/wt loss;??Long discussion of weight loss effects of other GLP1s esp semaglutide, and Mounjaro. Rec'd pt discuss changing Victoza to one of these w/his Endo??.  ?? Encouraged weight loss via healthy diet and regular exercise  Wt Readings from Last 3 Encounters:   07/17/21 (!) 111.1 kg (245 lb)   06/07/21 (!) 110.9 kg (244 lb 9.6 oz)   05/18/21 (!) 108.9 kg (240 lb 1.3 oz)            Opioid use disorder - Primary     Hx of??opioid??pill??abuse, starting after prescribed opioids for knee/shoulder surgeries. ??Hx??rare MJ use in social situations and regular CBD oil use, so expect UDS + cannabinoid. Stable on Subutex now x several yrs, well controlled on recent decrease dose to 1 tab bid.??  ?? Pt strongly desires to gradually taper down buprenorphine dose, but wants to keep same dose until next visit. Continue 8 mg (1 tab) SL BID.  ?? Next visit could try decreasing to 1 tab in am and 1/2 tab in evening  ?? We have discussed the risks of tapering down buprenorphine including relapse and overdose.   ?? Has rx narcan nasal spray   ?? Controlled substance contract signed??12/21/20  ?? UDS??collected??today  ?? PDMP today shows 22 day supply of subutex filled 06/22/21, 7 day supply filled 05/18/21 (indicating he likely uses only 1 tab daily many days). Continues to be prescribed testosterone from an outside provider. ?? Plan f/u in 2 mo (q60d OVs approp as pt stable on med long term and UDSs stable since establishing care w/me).            Relevant Medications    buprenorphine HCL (SUBUTEX) 8 mg tablet    Other Relevant Orders    Benzodiazepines, Conf, Urine    Opiates confirmation, random, urine    Toxicology Screen, Urine (Completed)    Smoker     Smoker x40 years+. Currently in the process of smoking cessation, is actively cutting down. has been doing very well on Chantix. Congratulated him on his efforts.  ?? CT lung ca screening done in [facility] 5/22  ?? Recent weight gain likely due to smoking less, discussed nicotine effect of appetite suppression, urged healthy diet/exercise to offset wt gain as he cuts down cigarette use  ?? Continue Chantix 1mg  BID  ?? Congratulated on smoking cessation efforts esp in setting of CAD        Other Visit Diagnoses     High risk medication use        Relevant Orders    Benzodiazepines, Conf, Urine    Opiates confirmation, random, urine    Toxicology Screen, Urine (Completed)  Return for f/u Suboxone w/UDS (within 60 days).     Medication adherence and barriers to the treatment plan have been addressed. Opportunities to optimize healthy behaviors have been discussed. Patient / caregiver voiced understanding.      Subjective:     Bernard Skinner is a 66 y.o. male who presents for Medication Refill and Follow-up            History of Present Illness          Patient presents for CDM follow-up. Last seen by me 05/18/21. GAD - Prescribed effexor-XR 37.5 mg BID, patient may decrease to 37.5mg  daily if he desires (prefers flexibility of this rx). OUD - Pt strongly desires to begin gradually tapering down buprenorphine dose. Decrease from??8 mg 2.5 tabs/d (1 am, 1 midday, 1/2 at bedtime) to 1 tab SL bid. We have discussed the risks of tapering down buprenorphine including relapse and overdose. urged patient if he feels cravings/withdrawal sxs to please increase back to 2.5 tabs/d dose and message me via MyChart/call to increase dosage back to baseline. Has rx narcan nasal spray. Controlled substance contract signed??12/21/20. UDS??collected??today. PDMP today shows 30 day subutex supplies have been filled: 04/18/21, 02/22/21, 12/21/20 (indicating pt is likely already taking less than 2.5 tab/d total ). Testosterone from Endocrinology filled 05/10/21. Phentermine 90 day supply last filled 12/21/20. Plan f/u in 2 mo (q60d OVs approp as pt stable on med long term and UDSs stable since establishing care w/me). Rec'd f/u 68mo given dose decrease but pt prefers 47mo f/u and says will let me know ASAP if not doing well. Smoker - CT lung ca screening done in [facility] 5/22. Rx'd Chantix 1mg  BID x 47mos to take after finishes current starter pack-can refill if needed once competes 40mo total course. Congratulated on smoking cessation efforts esp in setting of CAD. Given flu vaccine.     Seen by [facility] 05/30/21 for s/p Coronary Artery stent placement    Seen by Shon Hale MD 2/1 with Cardiology - HTN. Tobacco abuse - Advised patient of the negative effects of cigarettes and urged cessation. HLD - unable to tolerate statin therapy, last LDL 107, 07/01/20, started evolocumab last week. CAD - Advised patient to contact me if symptoms return and he will follow-up with Dr. Welton Flakes in 3-6 months. Obesity - Advised patient the benefits of losing weight. Encouraged patient to engage in daily exercise regimen.      Patient report he is doing well, is feeling okay with 2 films a day of subutex. Notes next time he comes in he wants to go down in dosage. Patient is looking to be prescribed phentermine, discussed that I need to have permission from his Cardiologist, with Dr. Adrian Blackwater. Patient notes he has decreased his smoking, notes he snacks more now that his nicotine consumption has lessened. Patient inquires about victoza. Discussed nature of medications with longer half-lifes and why certain medications last longer in the body. Patient notes sometimes it get constipated, but notes he usually has a bowel movement everyday, and gets upset if it takes longer than a day. Notes longest time waiting is 2 days. Patient feels okay on his cholesterol injections, reports no one has discussed his cholesterol in serious depth, and that something other than his cholesterol would have caused his stent issues.      Discussed Covid vaccine is NOT the cause of his CAD w/recent coronary stenting. Discussed his risk factors of smoking, DM2, HTN, age,  obesity are the cause of his CAD.      Taking med regularly- yes  Symptoms of withdrawal- no  Symptoms of opioid cravings- no  Medication side effects- yes  Attending counseling- no  Recent relapses- no  Other illicit substance use- no     PHQ-9 Score: 0     Screening complete, no depression identified / no further action needed today    Hemoglobin A1C (%)   Date Value   02/07/2018 7.3     LDL Calculated (mg/dL)   Date Value   81/19/1478 107 (H)   02/03/2018 76     BP Readings from Last 3 Encounters:   07/17/21 129/81   06/07/21 118/82   05/18/21 130/83     Wt Readings from Last 3 Encounters:   07/17/21 (!) 111.1 kg (245 lb)   06/07/21 (!) 110.9 kg (244 lb 9.6 oz)   05/18/21 (!) 108.9 kg (240 lb 1.3 oz)           Review of Systems     History obtained from the patient and chart review     Unless otherwise stated in the HPI:  CONSTITUTIONAL: no f/c/s  HEENT: Eyes: No diplopia or blurred vision. ENT: No earache, sore throat or runny nose.   CARDIOVASCULAR: No pressure, squeezing, strangling, tightness, heaviness or aching about the chest, neck, axilla or epigastrium.   RESPIRATORY: No cough, shortness of breath, PND or orthopnea.   GASTROINTESTINAL: No nausea, vomiting or diarrhea.   GENITOURINARY: No dysuria, frequency or urgency.   MUSCULOSKELETAL: As per HPI.   SKIN: No change in skin, hair or nails.   NEUROLOGIC: No paresthesias, fasciculations, seizures or weakness.   PSYCHIATRIC: No disorder of thought or mood.   ENDOCRINE: No heat or cold intolerance, polyuria or polydipsia.   HEMATOLOGICAL: No easy bruising or bleeding.            Review of History     MEDICATIONS:        Current Outpatient Medications on File Prior to Visit   Medication Sig Dispense Refill   ??? amLODIPine (NORVASC) 10 MG tablet      ??? aspirin (ECOTRIN) 81 MG tablet Take 1 tablet (81 mg total) by mouth daily. 30 tablet 11   ??? cetirizine (ZYRTEC) 10 MG tablet TAKE 1 TABLET BY MOUTH ONCE DAILY 90 tablet 3   ??? clopidogreL (PLAVIX) 75 mg tablet Take 1 tablet (75 mg total) by mouth daily. 30 tablet 11   ??? coenzyme Q10 100 mg capsule Take 1 capsule (100 mg total) by mouth.     ??? diazePAM (VALIUM) 5 MG tablet Take 0.5 tablets (2.5 mg total) by mouth.     ??? evolocumab (REPATHA SURECLICK) 140 mg/mL PnIj Inject the contents of one pen (140 mg) under the skin every fourteen (14) days. 6 mL 3   ??? ezetimibe (ZETIA) 10 mg tablet Take 1 tablet (10 mg total) by mouth daily.     ??? flash glucose sensor kit 1 each.     ??? insulin ASPART (NOVOLOG FLEXPEN) 100 unit/mL (3 mL) injection pen Inject up to 8 units three times daily before meals, as directed     ??? insulin glargine U-300 conc (TOUJEO SOLOSTAR) 300 unit/mL (1.5 mL) injection pen Toujeo SoloStar U-300 Insulin 300 unit/mL (1.5 mL) subcutaneous pen     ??? JARDIANCE 25 mg tablet      ??? lisinopriL-hydrochlorothiazide (PRINZIDE,ZESTORETIC) 10-12.5 mg per tablet      ??? naloxone (NARCAN) 4 mg  nasal spray One spray in either nostril once for known/suspected opioid overdose. May repeat every 2-3 minutes in alternating nostril til EMS arrives 2 each 0   ??? nitroglycerin (NITROSTAT) 0.3 MG SL tablet      ??? ondansetron (ZOFRAN) 8 MG tablet Take 1 tablet (8 mg total) by mouth Two (2) times a day. 180 tablet 3   ??? pantoprazole (PROTONIX) 40 MG tablet      ??? testosterone cypionate (DEPOTESTOTERONE CYPIONATE) 200 mg/mL injection   0   ??? timolol (TIMOPTIC) 0.5 % ophthalmic solution 4   ??? [EXPIRED] varenicline (CHANTIX) 1 mg tablet Take 1 tablet (1 mg total) by mouth Two (2) times a day. 60 tablet 1   ??? VASCEPA 1 gram cap TAKE 2 CAPSULES BY MOUTH TWICE A DAY 360 capsule 0   ??? venlafaxine (EFFEXOR-XR) 37.5 MG 24 hr capsule Take 2 capsules (75 mg total) by mouth daily. 180 capsule 3   ??? VICTOZA 3-PAK 0.6 mg/0.1 mL (18 mg/3 mL) injection Inject 0.3 mL (1.8 mg total) under the skin daily.  4   ??? vit C/E/Zn/coppr/lutein/zeaxan (PRESERVISION AREDS 2 ORAL) Take 2 tablets by mouth.     ??? zolpidem (AMBIEN) 10 mg tablet 1/2-1 tab po at bedtime prn 30 tablet 0   ??? fluticasone propionate (FLONASE) 50 mcg/actuation nasal spray 1 spray into each nostril Two (2) times a day. 32 g 11   ??? metFORMIN (GLUCOPHAGE) 1000 MG tablet Take 1 tablet (1,000 mg total) by mouth in the morning and 1 tablet (1,000 mg total) in the evening. Take with meals. 60 tablet 11     No current facility-administered medications on file prior to visit.        ALLERGIES:      Statins-hmg-coa reductase inhibitors         PAST MEDICAL HISTORY:     Past Medical History:   Diagnosis Date   ??? Coronary artery disease    ??? Diabetes mellitus (CMS-HCC)    ??? Diverticulosis    ??? Hyperlipidemia     statin intolerant   ??? Hypertension    ??? Opioid abuse, in remission (CMS-HCC) 05/23/2018   ??? Opioid use disorder     on subutex , stable as of 12/19       PAST SURGICAL HISTORY:     Past Surgical History:   Procedure Laterality Date   ??? APPENDECTOMY     ??? arthroscopic rotator cuff repair Right 07/27/2013   ??? FRACTURE SURGERY Right     ORIF R tibial plateau fracdture   ??? LEFT COLECTOMY  1977   ??? PR PRQ TRLUML CORONARY STENT W/ANGIO ONE ART/BRNCH N/A 05/03/2021    Procedure: CTO PCI;  Surgeon: Marlaine Hind, MD;  Location: [facility];  Service: Cardiology   ??? TONSILLECTOMY         PROBLEM LIST:     Patient Active Problem List    Diagnosis Date Noted   ??? Dyspnea on exertion 04/24/2021   ??? Fatigue 12/21/2020   ??? Gross hematuria 10/24/2020   ??? Disorder of rotator cuff syndrome of left shoulder and allied disorder 08/29/2020   ??? Sputum production 05/11/2020   ??? 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 Rhinorrhea 02/22/2020   ??? Nonintractable headache 12/23/2019   ??? GAD (generalized anxiety disorder) 09/17/2019   ??? Chronic nausea 09/17/2019   ??? Insomnia 09/01/2018   ??? Routine general medical examination at a  health care facility 07/03/2018   ??? PND (post-nasal drip) 07/03/2018   ??? Cough 07/03/2018   ??? Class 3 obesity (CMS-HCC) 07/03/2018   ??? Screening for colon cancer 05/25/2018   ??? Adenomatous polyp of colon 04/11/2018   ??? 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 Eye disorder 04/11/2018   ??? Smoker 04/11/2018   ??? Opioid use disorder 04/10/2018   ??? Hyperlipidemia 04/09/2018   ??? Back pain at L4-L5 level 02/03/2018   ??? Hypogonadism in male 04/11/2016   ??? CAD (coronary artery disease) 04/06/2015   ??? Sleep apnea 04/06/2015   ??? Erectile dysfunction 02/16/2014   ??? Essential hypertension 02/14/2014   ??? Long-term insulin use (CMS-HCC) 02/14/2014   ??? 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 Chronic knee pain 10/28/2013   ??? S/P rotator cuff repair 09/11/2013   ??? Derangement of medial meniscus 02/21/2012           SOCIAL HISTORY:      reports that he has been smoking cigarettes. He started smoking about 17 years ago. He has been smoking an average of .5 packs per day. He has never used smokeless tobacco. He reports current alcohol use. He reports current drug use. Drug: Marijuana.   FAMILY HISTORY:     family history includes Alcohol abuse in his father and son; Diabetes in his paternal aunt and son; Drug abuse in his son; Liver disease in his father; Mental illness in his son.       Objective:    BP 129/81  - Pulse 85  - Temp 36.6 ??C (97.9 ??F) (Temporal)  - Ht 175.3 cm (5' 9)  - Wt (!) 111.1 kg (245 lb)  - SpO2 99%  - BMI 36.18 kg/m??  - Body mass index is 36.18 kg/m??.    Physical exam:    General:  Well appearing, well nourished in no distress.   Skin: no obvious rash or  prominent lesions    Cardiovascular: no obvious JVD  Eyes:  conjunctiva clear, sclera non-icteric   Ears/nose/throat: no obvious external deformities  Respiratory: no distress    Musculoskeletal:  Normal gait and station.  Neurologic: moves extremities symmetrically, cranial nerves grossly intact  Hematologic: no obvious ecchymosis     Psychiatric:  Oriented X3, intact judgement and insight, normal mood and affect.            Recent Results (from the past 24 hour(s))   Toxicology Screen, Urine    Collection Time: 07/17/21  2:44 PM   Result Value Ref Range    Amphetamines Screen, Ur Negative <500 ng/mL    Barbiturates Screen, Ur Negative <200 ng/mL    Benzodiazepines Screen, Urine Negative <200 ng/mL    Cannabinoids Screen, Ur Negative <20 ng/mL    Methadone Screen, Urine Negative <300 ng/mL    Cocaine(Metab.)Screen, Urine Negative <150 ng/mL    Opiates Screen, Ur Negative <300 ng/mL    Fentanyl Screen, Ur Negative <1.0 ng/mL    Oxycodone Screen, Ur Negative <100 ng/mL    Buprenorphine, Urine Positive (A) <5 ng/mL        I attest that I, Paulino Rily, personally documented this note while acting as scribe for Arlyss Gandy, MD.      Paulino Rily, Scribe.  07/17/2021     The documentation recorded by the scribe accurately reflects the service I personally performed and the decisions made by me.  Arlyss Gandy, MD

## 2021-07-16 MED ORDER — BUPRENORPHINE HCL 8 MG SUBLINGUAL TABLET
ORAL_TABLET | Freq: Two times a day (BID) | SUBLINGUAL | 1 refills | 30 days
Start: 2021-07-16 — End: 2021-09-14

## 2021-07-17 ENCOUNTER — Ambulatory Visit: Admit: 2021-07-17 | Discharge: 2021-07-18 | Payer: PRIVATE HEALTH INSURANCE

## 2021-07-17 ENCOUNTER — Encounter: Payer: Self-pay | Admitting: *Deleted

## 2021-07-17 DIAGNOSIS — F1190 Opioid use, unspecified, uncomplicated: Principal | ICD-10-CM

## 2021-07-17 DIAGNOSIS — Z79899 Other long term (current) drug therapy: Principal | ICD-10-CM

## 2021-07-17 DIAGNOSIS — F17200 Nicotine dependence, unspecified, uncomplicated: Principal | ICD-10-CM

## 2021-07-17 DIAGNOSIS — Z955 Presence of coronary angioplasty implant and graft: Secondary | ICD-10-CM

## 2021-07-17 LAB — TOXICOLOGY SCREEN, URINE
AMPHETAMINE SCREEN URINE: NEGATIVE
BARBITURATE SCREEN URINE: NEGATIVE
BENZODIAZEPINE SCREEN, URINE: NEGATIVE
BUPRENORPHINE, URINE SCREEN: POSITIVE — AB
CANNABINOID SCREEN URINE: NEGATIVE
COCAINE(METAB.)SCREEN, URINE: NEGATIVE
FENTANYL SCREEN, URINE: NEGATIVE
METHADONE SCREEN, URINE: NEGATIVE
OPIATE SCREEN URINE: NEGATIVE
OXYCODONE SCREEN URINE: NEGATIVE

## 2021-07-17 MED ORDER — BUPRENORPHINE HCL 8 MG SUBLINGUAL TABLET
ORAL_TABLET | Freq: Two times a day (BID) | SUBLINGUAL | 1 refills | 30 days | Status: CP
Start: 2021-07-17 — End: 2021-09-15

## 2021-07-17 NOTE — Unmapped (Addendum)
Hx of??opioid??pill??abuse, starting after prescribed opioids for knee/shoulder surgeries. ??Hx??rare MJ use in social situations and daily CBD oil use, so expect UDS + cannabinoid. Stable on Subutex now x several yrs.??  ?? Pt strongly desires to gradually taper down buprenorphine dose. Continue 8 mg (1 tab) SL BID.  ?? We have discussed the risks of tapering down buprenorphine including relapse and overdose.   ?? Has rx narcan nasal spray   ?? Controlled substance contract signed??12/21/20  ?? UDS??collected??today  ?? PDMP today shows 22 day supply of subutex filled 06/22/21, 7 day supply filled 05/18/21. Continues to be prescribed testosterone from an outside provider.   ?? Plan f/u in 2 mo (q60d OVs approp as pt stable on med long term and UDSs stable since establishing care w/me).

## 2021-07-17 NOTE — Unmapped (Addendum)
Smoker x40 years+. Currently in the process of smoking cessation, has been doing very well on Chantix  ?? CT lung ca screening done in [facility] 5/22  ?? Continue Chantix 1mg  BID  ?? Congratulated on smoking cessation efforts esp in setting of CAD

## 2021-07-17 NOTE — Unmapped (Addendum)
Body mass index is 36.18 kg/m??.  Reports a goal weight of 220 lbs. Up 1 lb since last visit  ?? Patient seeks to restart phentermine, will contact Dr. Adrian Blackwater to see if this is acceptable.     ?? Of note, he is already on Jardiance and Victoza (from endocrine for DM2) that would help w/wt loss;??Endo??could also increase Victoza dosage??to Saxenda range, or change to Ozempic,??if covered by insurance  ?? Encouraged weight loss via healthy diet and regular exercise  Wt Readings from Last 3 Encounters:   07/17/21 (!) 111.1 kg (245 lb)   06/07/21 (!) 110.9 kg (244 lb 9.6 oz)   05/18/21 (!) 108.9 kg (240 lb 1.3 oz)

## 2021-07-18 MED ORDER — PHENTERMINE 37.5 MG CAPSULE
ORAL_CAPSULE | Freq: Every morning | ORAL | 0 refills | 90 days | Status: CP
Start: 2021-07-18 — End: 2021-10-16

## 2021-07-18 NOTE — Unmapped (Addendum)
Voicemail left with nurse line at Dr. Christy Sartorius office [facility] [phone]) with my name/cell # asking for callback to discuss if pt could safely take phentermine, or if this is contraindicated in setting of his CAD.    Dr. Welton Flakes called me back ~2pm to state pt's CAD is stable, good cardiac function, and he feels phentermine would be safe overall for pt to take, and feels benefits of weight loss would outweigh risks.     -clinical staff-please call pt (he hasnt' accessed Mychart in ~a year) to let him know I sent in a 90 day prescription for phentermine for him since his cardiologist gave the ok that it's safe for him to take. thx

## 2021-07-19 LAB — OPIATE, URINE, QUANTITATIVE
6-MONOACETYLMRPH: <5 ng/mL (ref <5)
BUPRENORPHINE: 96 ng/mL — ABNORMAL HIGH (ref <5)
CODEINE GC/MS CONF: <25 ng/mL (ref <25)
FENTANYL, URINE GC/MS: <0.5 ng/mL (ref <0.5)
HYDROCODONE GC/MS CONF: <25 ng/mL (ref <25)
HYDROMORPHONE GC/MS CONF: <25 ng/mL (ref <25)
MORPHINE GC/MS CONF: <25 ng/mL (ref <25)
NORBUPRENORPHINE: 198 ng/mL — ABNORMAL HIGH (ref <5)
NORFENTANYL, UR GC/MS: <1 ng/mL (ref <1)
OPIATE INTERP: POSITIVE
OXYCODONE (GC/MS): <25 ng/mL (ref <25)
OXYMORPHONE: <25 ng/mL (ref <25)

## 2021-07-19 NOTE — Unmapped (Signed)
Addended by: Arlyss Gandy on: 07/18/2021 07:38 PM     Modules accepted: Orders

## 2021-07-21 LAB — BENZODIAZEPINES, CONF, URINE
2-HYDROXY ETHYL FLURAZEPAM BY MS CONFIRM: NEGATIVE ng/mL
ALPHA-HYDROXY MIDAZOLAM BY MS CONFIRM: NEGATIVE ng/mL
ALPRAZOLAM BY MS CONFIRM: NEGATIVE ng/mL
BENZODIAZEPINES INTERPRETATION: NEGATIVE
CHLORDIAZEPOXIDE BY MS CONFIRM: NEGATIVE ng/mL
CLOBAZAM BY MS CONFIRM: NEGATIVE ng/mL
CLONAZEPAM BY MS CONFIRM: NEGATIVE ng/mL
DIAZEPAM BY MS CONFIRM: NEGATIVE ng/mL
FLUNITRAZEPAM BY MS CONFIRM: NEGATIVE ng/mL
FLURAZEPAM BY MS CONFIRM: NEGATIVE ng/mL
MIDAZOLAM BY MS CONFIRM: NEGATIVE ng/mL
N-DESMETHYLCLOBAZAM BY MS CONFIRM: NEGATIVE ng/mL
PRAZEPAM BY MS CONFIRM: NEGATIVE ng/mL
TEMAZEPAM-BY GC/MS: NEGATIVE ng/mL
TRIAZOLAM BY MS CONFIRM: NEGATIVE ng/mL
ZOLPIDEM BY MS CONFIRM: NEGATIVE ng/mL
ZOLPIDEM PHENYL-4-CARBOXYLIC ACID BY MS CONFIRM: NEGATIVE ng/mL

## 2021-07-26 ENCOUNTER — Encounter: Payer: Self-pay | Admitting: *Deleted

## 2021-07-26 ENCOUNTER — Telehealth: Payer: Self-pay

## 2021-07-26 DIAGNOSIS — Z955 Presence of coronary angioplasty implant and graft: Secondary | ICD-10-CM

## 2021-07-26 NOTE — Progress Notes (Signed)
Patient called to say he would not be able to return to rehab for awhile. He states things are busy right now and is not a good time for him. Informed him how and if he wants to resume to obtain a referral from his doctor. Patient verbalizes understanding. Discharge summery sent Dr. Emily Filbert.  ?

## 2021-07-26 NOTE — Progress Notes (Signed)
Discharge Progress Report ? ?Patient Details  ?Name: Joshua VANDENBERGHE Sr. ?MRN: [id number] ?Date of Birth: 1956-03-27 ?Referring Provider:   ?Flowsheet Row Cardiac Rehab from 06/13/2021 in [facility] Cardiac and Pulmonary Rehab  ?Referring Provider Neoma Laming MD  ? ?  ? ? ? ?Number of Visits: 3/36 ? ?Reason for Discharge:  ?Early Exit:  Back to work and Personal ? ?Smoking History:  ?Social History  ? ?Tobacco Use  ?Smoking Status Every Day  ? Packs/day: 0.75  ? Years: 30.00  ? Pack years: 22.50  ? Types: Cigarettes  ?Smokeless Tobacco Never  ?Tobacco Comments  ? Working down number of cigarettes a day and has started Chantix  ? ? ?Diagnosis:  ?Status post coronary artery stent placement ? ?ADL [facility]: ? ? ?Initial Exercise Prescription: ? Initial Exercise Prescription - 06/13/21 1000   ? ?  ? Date of Initial Exercise RX and Referring Provider  ? Date 06/13/21   ? Referring Provider Neoma Laming MD   ?  ? Oxygen  ? Maintain Oxygen Saturation 88% or higher   ?  ? Treadmill  ? MPH 2.4   ? Grade 0   ? Minutes 15   ? METs 2.84   ?  ? T5 Nustep  ? Level 1   ? SPM 80   ? Minutes 15   ? METs 2.3   ?  ? Biostep-RELP  ? Level 1   ? SPM 50   ? Minutes 15   ? METs 2.3   ?  ? Track  ? Laps 28   ? Minutes 15   ? METs 2.52   ?  ? Prescription Details  ? Frequency (times per week) 3   ? Duration Progress to 30 minutes of continuous aerobic without signs/symptoms of physical distress   ?  ? Intensity  ? THRR 40-80% of Max Heartrate 109 - 139   ? Ratings of Perceived Exertion 11-13   ? Perceived Dyspnea 0-4   ?  ? Progression  ? Progression Continue to progress workloads to maintain intensity without signs/symptoms of physical distress.   ?  ? Resistance Training  ? Training Prescription Yes   ? Weight 5 lb   ? Reps 10-15   ? ?  ?  ? ?  ? ? ?Discharge Exercise Prescription (Final Exercise Prescription Changes): ? Exercise Prescription Changes - 07/03/21 1200   ? ?  ? Response to Exercise  ? Blood Pressure (Admit) 118/70   ? Blood Pressure  (Exercise) 142/66   ? Blood Pressure (Exit) 122/72   ? Heart Rate (Admit) 77 bpm   ? Heart Rate (Exercise) 120 bpm   ? Heart Rate (Exit) 107 bpm   ? Rating of Perceived Exertion (Exercise) 11   ? Symptoms none   ? Comments second day   ? Duration Progress to 30 minutes of  aerobic without signs/symptoms of physical distress   ? Intensity THRR unchanged   ?  ? Progression  ? Progression Continue to progress workloads to maintain intensity without signs/symptoms of physical distress.   ? Average METs 3.6   ?  ? Resistance Training  ? Training Prescription Yes   ? Weight 5 lb   ? Reps 10-15   ?  ? Recumbant Bike  ? Level 2   ? Minutes 15   ? METs 3.3   ?  ? NuStep  ? Level 3   ? Minutes 15   ? METs 3.9   ? ?  ?  ? ?  ? ? ?  Functional Capacity: ? 6 Minute Walk   ? ? [name] 06/13/21 1004  ?  ?  ?  ? 6 Minute Walk  ? Phase Initial    ? Distance 1235 feet    ? Walk Time 6 minutes    ? # of Rest Breaks 0    ? MPH 2.33    ? METS 2.35    ? RPE 10    ? Perceived Dyspnea  1    ? VO2 Peak 8.24    ? Symptoms Yes (comment)    ? Comments Bilateral hip pain 6/10    ? Resting HR 80 bpm    ? Resting BP 124/80    ? Resting Oxygen Saturation  97 %    ? Exercise Oxygen Saturation  during 6 min walk 95 %    ? Max Ex. HR 105 bpm    ? Max Ex. BP 138/78    ? 2 Minute Post BP 118/76    ? ?  ?  ? ?  ? ? ?Psychological, QOL, Others - Outcomes: ?PHQ 2/9: ? ?  06/13/2021  ?  9:58 AM 09/01/2019  ? 10:15 AM 08/06/2018  ?  8:08 AM 02/03/2018  ? 10:23 AM 11/12/2016  ? 10:20 AM  ?Depression screen PHQ 2/9  ?Decreased Interest 0 0 0 0 0  ?Down, Depressed, Hopeless 0 0 0 0 0  ?PHQ - 2 Score 0 0 0 0 0  ?Altered sleeping 0   0   ?Tired, decreased energy 0   0   ?Change in appetite 0   0   ?Feeling bad or failure about yourself  0   0   ?Trouble concentrating 0   0   ?Moving slowly or fidgety/restless 0   0   ?Suicidal thoughts 0   0   ?PHQ-9 Score 0   0   ?Difficult doing work/chores Not difficult at all      ? ? ?Quality of Life: ? Quality of Life - 06/13/21  1030   ? ?  ? Quality of Life  ? Select Quality of Life   ?  ? Quality of Life Scores  ? Health/Function Pre 26 %   ? Socioeconomic Pre 28.07 %   ? Psych/Spiritual Pre 30 %   ? Family Pre 30 %   ? GLOBAL Pre 27.89 %   ? ?  ?  ? ?  ? ? ?Personal Goals: ?Goals established at orientation with interventions provided to work toward goal. ? Personal Goals and Risk Factors at Admission - 06/13/21 1037   ? ?  ? Core Components/Risk Factors/Patient Goals on Admission  ?  Weight Management Yes;Obesity;Weight Loss   ? Intervention Weight Management: Develop a combined nutrition and exercise program designed to reach desired caloric intake, while maintaining appropriate intake of nutrient and fiber, sodium and fats, and appropriate energy expenditure required for the weight goal.;Weight Management: Provide education and appropriate resources to help participant work on and attain dietary goals.;Weight Management/Obesity: Establish reasonable short term and long term weight goals.   ? Admit Weight 237 lb (107.5 kg)   ? Goal Weight: Short Term 232 lb (105.2 kg)   ? Goal Weight: Long Term 225 lb (102.1 kg)   ? Expected Outcomes Short Term: Continue to assess and modify interventions until short term weight is achieved;Long Term: Adherence to nutrition and physical activity/exercise program aimed toward attainment of established weight goal;Weight Loss: Understanding of general recommendations for a balanced deficit meal  plan, which promotes 1-2 lb weight loss per week and includes a negative energy balance of [phone] kcal/d;Understanding recommendations for meals to include 15-35% energy as protein, 25-35% energy from fat, 35-60% energy from carbohydrates, less than '200mg'$  of dietary cholesterol, 20-35 gm of total fiber daily;Understanding of distribution of calorie intake throughout the day with the consumption of 4-5 meals/snacks   ? Tobacco Cessation Yes   ? Number of packs per day [name] is a current tobacco user. Intervention  for tobacco cessation was provided at the initial medical review. He was asked about readiness to quit and reported he is working on quitting by decreasing number of cigarettes per day and he has started taking Chantix . Patient was advised and educated about tobacco cessation using combination therapy, tobacco cessation classes, quit line, and quit smoking apps. Patient demonstrated understanding of this material. Staff will continue to provide encouragement and follow up with the patient throughout the program.   ? Intervention Assist the participant in steps to quit. Provide individualized education and counseling about committing to Tobacco Cessation, relapse prevention, and pharmacological support that can be provided by physician.;Advice worker, assist with locating and accessing local/national Quit Smoking programs, and support quit date choice.   ? Expected Outcomes Short Term: Will demonstrate readiness to quit, by selecting a quit date.;Short Term: Will quit all tobacco product use, adhering to prevention of relapse plan.;Long Term: Complete abstinence from all tobacco products for at least 12 months from quit date.   ? Diabetes Yes   ? Intervention Provide education about signs/symptoms and action to take for hypo/hyperglycemia.;Provide education about proper nutrition, including hydration, and aerobic/resistive exercise prescription along with prescribed medications to achieve blood glucose in normal ranges: Fasting glucose 65-99 mg/dL   ? Expected Outcomes Short Term: Participant verbalizes understanding of the signs/symptoms and immediate care of hyper/hypoglycemia, proper foot care and importance of medication, aerobic/resistive exercise and nutrition plan for blood glucose control.;Long Term: Attainment of HbA1C < 7%.   ? Hypertension Yes   ? Intervention Provide education on lifestyle modifcations including regular physical activity/exercise, weight management, moderate sodium  restriction and increased consumption of fresh fruit, vegetables, and low fat dairy, alcohol moderation, and smoking cessation.;Monitor prescription use compliance.   ? Expected Outcomes Short Term: Continued

## 2021-07-26 NOTE — Telephone Encounter (Signed)
Patient called to say he would not be able to return to rehab for awhile. He states things are busy right now and is not a good time for him. Informed him how and if he wants to resume to obtain a referral from his doctor. Patient verbalizes understanding. ?

## 2021-07-26 NOTE — Progress Notes (Signed)
Cardiac Individual Treatment Plan ? ?Patient Details  ?Name: Joshua BALAGUER Sr. ?MRN: [id number] ?Date of Birth: 01/09/1956 ?Referring Provider:   ?Flowsheet Row Cardiac Rehab from 06/13/2021 in [facility] Cardiac and Pulmonary Rehab  ?Referring Provider Neoma Laming MD  ? ?  ? ? ?Initial Encounter Date:  ?Flowsheet Row Cardiac Rehab from 06/13/2021 in [facility] Cardiac and Pulmonary Rehab  ?Date 06/13/21  ? ?  ? ? ?Visit Diagnosis: Status post coronary artery stent placement ? ?Patient's Home Medications on Admission: ? ?Current Outpatient Medications:  ?  amLODipine (NORVASC) 10 MG tablet, Take 10 mg by mouth daily., Disp: , Rfl:  ?  aspirin EC 81 MG tablet, Take 81 mg by mouth at bedtime., Disp: , Rfl:  ?  B-D 3CC LUER-LOK SYR 21GX1" 21G X 1" 3 ML MISC, USE TO INJECT 1 ML OF TESTOSTERONE INTO MUSCLE, Disp: , Rfl: 12 ?  BD HYPODERMIC NEEDLE 18G X 1" MISC, USE AS DIRECTED FOR WEEKLY INJECTIONS, Disp: , Rfl: 1 ?  BD ULTRA-FINE PEN NEEDLES 29G X 12.7MM MISC, USE 2 (TWO) TIMES DAILY., Disp: , Rfl: 3 ?  buprenorphine (SUBUTEX) 8 MG SUBL SL tablet, Place 4-8 mg under the tongue 2 (two) times daily as needed (pain). Take 8 mg by mouth in the morning and 4 mg in the evening as needed for pain, Disp: , Rfl:  ?  cetirizine (ZYRTEC) 10 MG tablet, Take 10 mg by mouth daily., Disp: , Rfl:  ?  clopidogrel (PLAVIX) 75 MG tablet, Take 75 mg by mouth daily., Disp: , Rfl:  ?  Coenzyme Q10 (CO Q-10) 100 MG CAPS, Take 100 mg by mouth in the morning and at bedtime., Disp: , Rfl:  ?  Continuous Blood Gluc Receiver (FREESTYLE LIBRE READER) DEVI, USE 1 EACH ONCE DAILY., Disp: , Rfl: 1 ?  Continuous Blood Gluc Sensor (FREESTYLE LIBRE SENSOR SYSTEM) MISC, USE 3 EACH EVERY 10 (TEN) DAYS., Disp: , Rfl: 11 ?  diazepam (VALIUM) 5 MG tablet, Take 2.5 mg by mouth daily as needed for anxiety., Disp: , Rfl:  ?  Evolocumab (REPATHA SURECLICK) 115 MG/ML SOAJ, Inject into the skin., Disp: , Rfl:  ?  ezetimibe (ZETIA) 10 MG tablet, TAKE ONE TABLET EVERY DAY, Disp:  90 tablet, Rfl: 0 ?  FLUoxetine (PROZAC) 20 MG tablet, Take 40 mg by mouth daily. (Patient not taking: Reported on 05/30/2021), Disp: , Rfl:  ?  gabapentin (NEURONTIN) 300 MG capsule, TAKE 1 CAPSULE 3 TIMES DAILY (Patient not taking: Reported on 03/21/2021), Disp: 270 capsule, Rfl: 1 ?  gentamicin cream (GARAMYCIN) 0.1 %, Apply 1 application topically 2 (two) times daily. (Patient taking differently: Apply 1 application topically daily as needed (irritation).), Disp: 15 g, Rfl: 1 ?  hydrocortisone 2.5 % cream, Apply 1 application topically daily as needed (rash).  (Patient not taking: Reported on 03/24/2021), Disp: , Rfl:  ?  ibuprofen (ADVIL) 800 MG tablet, Take 1 tablet (800 mg total) by mouth 3 (three) times daily. (Patient not taking: Reported on 05/30/2021), Disp: 270 tablet, Rfl: 1 ?  insulin aspart (NOVOLOG) 100 UNIT/ML FlexPen, Inject 2-6 Units into the skin 3 (three) times daily as needed (blood sugar of 250 or above)., Disp: , Rfl:  ?  insulin lispro (HUMALOG KWIKPEN) 100 UNIT/ML KiwkPen, Inject 0.4 mLs (40 Units total) into the skin 3 (three) times daily. Use 10-40u with meals (Patient not taking: Reported on 05/30/2021), Disp: 5 pen, Rfl: 11 ?  JARDIANCE 25 MG TABS tablet, Take 25 mg by mouth  daily., Disp: , Rfl:  ?  lisinopril-hydrochlorothiazide (ZESTORETIC) 20-25 MG tablet, Take 1 tablet by mouth daily., Disp: , Rfl:  ?  meloxicam (MOBIC) 7.5 MG tablet, Take 7.5 mg by mouth in the morning and at bedtime. (Patient not taking: Reported on 05/30/2021), Disp: , Rfl:  ?  metFORMIN (GLUCOPHAGE) 1000 MG tablet, Take 1,000 mg by mouth 2 (two) times daily with a meal., Disp: , Rfl:  ?  Multiple Vitamins-Minerals (PRESERVISION AREDS 2 PO), Take 2 tablets by mouth in the morning and at bedtime., Disp: , Rfl:  ?  NARCAN 4 MG/0.1ML LIQD nasal spray kit, Place 0.4 mg into the nose once., Disp: , Rfl:  ?  ondansetron (ZOFRAN) 4 MG tablet, Take 1 tablet (4 mg total) by mouth 2 (two) times daily. (Patient not taking:  Reported on 03/21/2021), Disp: 180 tablet, Rfl: 1 ?  ondansetron (ZOFRAN) 8 MG tablet, Take 8 mg by mouth 2 (two) times daily., Disp: , Rfl:  ?  pantoprazole (PROTONIX) 40 MG tablet, TAKE ONE TABLET EVERY DAY, Disp: 90 tablet, Rfl: 1 ?  sildenafil (REVATIO) 20 MG tablet, Take 1-5 tablets (20-100 mg total) by mouth as needed., Disp: 50 tablet, Rfl: 12 ?  testosterone cypionate (DEPOTESTOSTERONE CYPIONATE) 200 MG/ML injection, Inject 200 mg into the muscle every 14 (fourteen) days. , Disp: , Rfl:  ?  timolol (TIMOPTIC) 0.5 % ophthalmic solution, Place 1 drop into the left eye 2 (two) times daily. , Disp: , Rfl: 3 ?  TOUJEO SOLOSTAR 300 UNIT/ML Solostar Pen, Inject 42 Units into the skin at bedtime., Disp: , Rfl:  ?  VASCEPA 1 g capsule, Take 2 g by mouth 2 (two) times daily., Disp: , Rfl:  ?  venlafaxine XR (EFFEXOR-XR) 37.5 MG 24 hr capsule, Take 1 capsule (37.5 mg total) by mouth daily., Disp: 90 capsule, Rfl: 1 ?  VICTOZA 18 MG/3ML SOPN, Inject 1.8 mg into the skin daily. , Disp: , Rfl:  ?  zolpidem (AMBIEN) 10 MG tablet, Take 10 mg by mouth at bedtime as needed for sleep. , Disp: , Rfl:  ?No current facility-administered medications for this visit. ? ?Facility-Administered Medications Ordered in Other Visits:  ?  sodium chloride flush (NS) 0.9 % injection 3 mL, 3 mL, Intravenous, Q12H, Dionisio David, MD ? ?Past Medical History: ?Past Medical History:  ?Diagnosis Date  ? A-fib ([place]) 1995  ? CAD (coronary artery disease)   ? Diabetes mellitus without complication ([place])   ? GERD (gastroesophageal reflux disease)   ? History of kidney stones   ? Hyperlipidemia   ? Hypertension   ? Sleep apnea   ? cpap  ? Type 2 diabetes mellitus without complication ([facility])   ? ? ?Tobacco Use: ?Social History  ? ?Tobacco Use  ?Smoking Status Every Day  ? Packs/day: 0.75  ? Years: 30.00  ? Pack years: 22.50  ? Types: Cigarettes  ?Smokeless Tobacco Never  ?Tobacco Comments  ? Working down number of cigarettes a day and has started  Chantix  ? ? ?Labs: ?Review Flowsheet   ? ?  ?  Latest Ref Rng & Units 04/11/2016 11/12/2016 05/21/2017 02/03/2018  ?Labs for [facility]P Cardiac and Pulmonary Rehab  ?Cholestrol 100 - 199 mg/dL 112   167   179   172    ?LDL (calc) 0 - 99 mg/dL 61    95     ?HDL-C >39 mg/dL 31    34     ?Trlycerides 0 - 149 mg/dL 99  211   249   297    ?Hemoglobin A1c <7.0 %      ? ?  09/01/2019  ?Labs for [facility]P Cardiac and Pulmonary Rehab  ?Cholestrol 142    ?LDL (calc) 85    ?HDL-C 36    ?Trlycerides 117    ?Hemoglobin A1c 6.3    ?  ? ? Multiple values from one day are sorted in reverse-chronological order  ?  ?  ? ? ? ?Exercise Target Goals: ?Exercise Program Goal: ?Individual exercise prescription set using results from initial 6 min walk test and THRR while considering  patient?s activity barriers and safety.  ? ?Exercise Prescription Goal: ?Initial exercise prescription builds to 30-45 minutes a day of aerobic activity, 2-3 days per week.  Home exercise guidelines will be given to patient during program as part of exercise prescription that the participant will acknowledge. ? ? ?Education: Aerobic Exercise: ?- Group verbal and visual presentation on the components of exercise prescription. Introduces F.I.T.T principle from ACSM for exercise prescriptions.  Reviews F.I.T.T. principles of aerobic exercise including progression. Written material given at graduation. ? ? ?Education: Resistance Exercise: ?- Group verbal and visual presentation on the components of exercise prescription. Introduces F.I.T.T principle from ACSM for exercise prescriptions  Reviews F.I.T.T. principles of resistance exercise including progression. Written material given at graduation. ? ?  ?Education: Exercise & Equipment Safety: ?- Individual verbal instruction and demonstration of equipment use and safety with use of the equipment. ?Flowsheet Row Cardiac Rehab from 06/13/2021 in [facility] Cardiac and Pulmonary Rehab  ?Education need identified 06/13/21  ?Date 06/13/21   ?Educator KL  ?Instruction Review Code 1- Verbalizes Understanding  ? ?  ? ? ?Education: Exercise Physiology & General Exercise Guidelines: ?- Group verbal and written instruction with models to review the exercis

## 2021-07-27 ENCOUNTER — Ambulatory Visit: Payer: HMO

## 2021-08-01 ENCOUNTER — Ambulatory Visit: Payer: HMO

## 2021-08-03 ENCOUNTER — Ambulatory Visit: Payer: HMO

## 2021-08-08 ENCOUNTER — Ambulatory Visit: Payer: HMO

## 2021-08-10 ENCOUNTER — Ambulatory Visit: Payer: HMO

## 2021-08-11 NOTE — Unmapped (Signed)
[facility] Clinical Assessment & Refill Coordination Note    Bernard Skinner, DOB: February 15, 1956  Phone: [phone] (home) [phone] (work)    All above HIPAA information was verified with patient.     Was a Nurse, learning disability used for this call? No    Specialty Medication(s):   General Specialty: Repatha     Current Outpatient Medications   Medication Sig Dispense Refill   ??? amLODIPine (NORVASC) 10 MG tablet      ??? aspirin (ECOTRIN) 81 MG tablet Take 1 tablet (81 mg total) by mouth daily. 30 tablet 11   ??? buprenorphine HCL (SUBUTEX) 8 mg tablet Place 1 tablet (8 mg total) under the tongue Two (2) times a day. 60 tablet 1   ??? cetirizine (ZYRTEC) 10 MG tablet TAKE 1 TABLET BY MOUTH ONCE DAILY 90 tablet 3   ??? clopidogreL (PLAVIX) 75 mg tablet Take 1 tablet (75 mg total) by mouth daily. 30 tablet 11   ??? coenzyme Q10 100 mg capsule Take 1 capsule (100 mg total) by mouth.     ??? diazePAM (VALIUM) 5 MG tablet Take 0.5 tablets (2.5 mg total) by mouth.     ??? evolocumab (REPATHA SURECLICK) 140 mg/mL PnIj Inject the contents of one pen (140 mg) under the skin every fourteen (14) days. 6 mL 3   ??? ezetimibe (ZETIA) 10 mg tablet Take 1 tablet (10 mg total) by mouth daily.     ??? flash glucose sensor kit 1 each.     ??? fluticasone propionate (FLONASE) 50 mcg/actuation nasal spray 1 spray into each nostril Two (2) times a day. 32 g 11   ??? insulin ASPART (NOVOLOG FLEXPEN) 100 unit/mL (3 mL) injection pen Inject up to 8 units three times daily before meals, as directed     ??? insulin glargine U-300 conc (TOUJEO SOLOSTAR) 300 unit/mL (1.5 mL) injection pen Toujeo SoloStar U-300 Insulin 300 unit/mL (1.5 mL) subcutaneous pen     ??? JARDIANCE 25 mg tablet      ??? lisinopriL-hydrochlorothiazide (PRINZIDE,ZESTORETIC) 10-12.5 mg per tablet      ??? metFORMIN (GLUCOPHAGE) 1000 MG tablet Take 1 tablet (1,000 mg total) by mouth in the morning and 1 tablet (1,000 mg total) in the evening. Take with meals. 60 tablet 11   ??? naloxone (NARCAN) 4 mg nasal spray One spray in either nostril once for known/suspected opioid overdose. May repeat every 2-3 minutes in alternating nostril til EMS arrives 2 each 0   ??? nitroglycerin (NITROSTAT) 0.3 MG SL tablet      ??? ondansetron (ZOFRAN) 8 MG tablet Take 1 tablet (8 mg total) by mouth Two (2) times a day. 180 tablet 3   ??? pantoprazole (PROTONIX) 40 MG tablet      ??? phentermine 37.5 MG capsule Take 1 capsule (37.5 mg total) by mouth every morning. 90 capsule 0   ??? testosterone cypionate (DEPOTESTOTERONE CYPIONATE) 200 mg/mL injection   0   ??? timolol (TIMOPTIC) 0.5 % ophthalmic solution   4   ??? VASCEPA 1 gram cap TAKE 2 CAPSULES BY MOUTH TWICE A DAY 360 capsule 0   ??? venlafaxine (EFFEXOR-XR) 37.5 MG 24 hr capsule Take 2 capsules (75 mg total) by mouth daily. 180 capsule 3   ??? VICTOZA 3-PAK 0.6 mg/0.1 mL (18 mg/3 mL) injection Inject 0.3 mL (1.8 mg total) under the skin daily.  4   ??? vit C/E/Zn/coppr/lutein/zeaxan (PRESERVISION AREDS 2 ORAL) Take 2 tablets by mouth.     ???  zolpidem (AMBIEN) 10 mg tablet 1/2-1 tab po at bedtime prn 30 tablet 0     No current facility-administered medications for this visit.        Changes to medications: Math reports no changes at this time.    Allergies   Allergen Reactions   ??? Statins-Hmg-Coa Reductase Inhibitors      Nausea w/rosuva and simva       Changes to allergies: No    SPECIALTY MEDICATION ADHERENCE     Repatha 140 mg/ml: 16 days of medicine on hand       Medication Adherence    Patient reported X missed doses in the last month: 0  Specialty Medication: Repatha 140mg /mL  Informant: patient          Specialty medication(s) dose(s) confirmed: Regimen is correct and unchanged.     Are there any concerns with adherence? No    Adherence counseling provided? Not needed    CLINICAL MANAGEMENT AND INTERVENTION      Clinical Benefit Assessment:    Do you feel the medicine is effective or helping your condition? Yes    Clinical Benefit counseling provided? Not needed    Adverse Effects Assessment:    Are you experiencing any side effects? No    Are you experiencing difficulty administering your medicine? No    Quality of Life Assessment:     How many days over the past month did your CAD  keep you from your normal activities? For example, brushing your teeth or getting up in the morning. Patient declined to answer    Have you discussed this with your provider? Not needed    Acute Infection Status:    Acute infections noted within Epic:  No active infections  Patient reported infection: None    Therapy Appropriateness:    Is therapy appropriate and patient progressing towards therapeutic goals? Yes, therapy is appropriate and should be continued    DISEASE/MEDICATION-SPECIFIC INFORMATION      For patients on injectable medications: Patient currently has 1 doses left.  Next injection is scheduled for 08/13/21.    PATIENT SPECIFIC NEEDS     - Does the patient have any physical, cognitive, or cultural barriers? No    - Is the patient high risk? No    - Does the patient require a Care Management Plan? No     SOCIAL DETERMINANTS OF HEALTH     At the [facility], we have learned that life circumstances - like trouble affording food, housing, utilities, or transportation can affect the health of many of our patients.   That is why we wanted to ask: are you currently experiencing any life circumstances that are negatively impacting your health and/or quality of life? Patient declined to answer    Social Determinants of Health     Financial Resource Strain: Not on file   Internet Connectivity: Not on file   Food Insecurity: No Food Insecurity   ??? Worried About Programme researcher, broadcasting/film/video in the Last Year: Never true   ??? Ran Out of Food in the Last Year: Never true   Tobacco Use: High Risk   ??? Smoking Tobacco Use: Every Day   ??? Smokeless Tobacco Use: Never   ??? Passive Exposure: Not on file   Housing/Utilities: Not on file   Alcohol Use: Not on file   Transportation Needs: Not on file Substance Use: Not on file   Health Literacy: High Risk   ??? : Often   Physical  Activity: Not on file   Interpersonal Safety: Not on file   Stress: Not on file   Intimate Partner Violence: Not on file   Depression: Not at risk   ??? PHQ-2 Score: 0   Social Connections: Not on file       Would you be willing to receive help with any of the needs that you have identified today? Not applicable       SHIPPING     Specialty Medication(s) to be Shipped:   General Specialty: Repatha    Other medication(s) to be shipped: No additional medications requested for fill at this time     Changes to insurance: No    Delivery Scheduled: Yes, Expected medication delivery date: 08/23/21.     Medication will be delivered via Same Day Courier to the confirmed prescription address in [facility].    The patient will receive a drug information handout for each medication shipped and additional FDA Medication Guides as required.  Verified that patient has previously received a Conservation officer, historic buildings and a Surveyor, mining.    The patient or caregiver noted above participated in the development of this care plan and knows that they can request review of or adjustments to the care plan at any time.      All of the patient's questions and concerns have been addressed.    Camillo Flaming   [facility] Specialty Pharmacist

## 2021-08-15 ENCOUNTER — Ambulatory Visit: Payer: HMO

## 2021-08-17 ENCOUNTER — Ambulatory Visit: Payer: HMO

## 2021-08-22 ENCOUNTER — Ambulatory Visit: Payer: HMO

## 2021-08-23 NOTE — Unmapped (Signed)
Bernard Skinner 's Repatha shipment will be delayed as a result of a high copay.     I have reached out to the patient  at [phone] and communicated the delay. We will wait for a call back from the patient to reschedule the delivery.  We have not confirmed the new delivery date.

## 2021-08-24 ENCOUNTER — Ambulatory Visit: Payer: HMO

## 2021-08-29 ENCOUNTER — Ambulatory Visit: Payer: HMO

## 2021-08-31 ENCOUNTER — Ambulatory Visit: Payer: HMO

## 2021-09-05 ENCOUNTER — Ambulatory Visit: Payer: HMO

## 2021-09-07 ENCOUNTER — Ambulatory Visit: Payer: HMO | Admitting: Dermatology

## 2021-09-07 ENCOUNTER — Ambulatory Visit: Payer: HMO

## 2021-09-07 DIAGNOSIS — L820 Inflamed seborrheic keratosis: Secondary | ICD-10-CM | POA: Diagnosis not present

## 2021-09-07 DIAGNOSIS — L578 Other skin changes due to chronic exposure to nonionizing radiation: Secondary | ICD-10-CM

## 2021-09-07 DIAGNOSIS — T07XXXA Unspecified multiple injuries, initial encounter: Secondary | ICD-10-CM

## 2021-09-07 DIAGNOSIS — L570 Actinic keratosis: Secondary | ICD-10-CM

## 2021-09-07 DIAGNOSIS — T1490XA Injury, unspecified, initial encounter: Secondary | ICD-10-CM

## 2021-09-07 MED ORDER — MUPIROCIN 2 % EX OINT: 1.0000 "application " | TOPICAL_OINTMENT | Freq: Every day | CUTANEOUS | 11 refills | Status: DC

## 2021-09-07 MED ORDER — FLUOROURACIL 5 % EX CREA: TOPICAL_CREAM | Freq: Two times a day (BID) | CUTANEOUS | 1 refills | Status: DC

## 2021-09-07 NOTE — Patient Instructions (Addendum)
?Actinic keratoses are precancerous spots that appear secondary to cumulative UV radiation exposure/sun exposure over time. They are chronic with expected duration over 1 year. A portion of actinic keratoses will progress to squamous cell carcinoma of the skin. It is not possible to reliably predict which spots will progress to skin cancer and so treatment is recommended to prevent development of skin cancer. ? ?Recommend daily broad spectrum sunscreen SPF 30+ to sun-exposed areas, reapply every 2 hours as needed.  ?Recommend staying in the shade or wearing long sleeves, sun glasses (UVA+UVB protection) and wide brim hats (4-inch brim around the entire circumference of the hat). ?Call for new or changing lesions.  ? ? ? ?Cryotherapy Aftercare ? ?Wash gently with soap and water everyday.   ?Apply Vaseline and Band-Aid daily until healed.  ? ? ?Start Cream to forehead and scalp June 4th  ? ?Start 5-fluorouracil/calcipotriene cream twice a day for 7 days to affected areas including scalp / forehead. Prescription sent to [facility]. Patient provided with contact information for pharmacy and advised the pharmacy will mail the prescription to their home. Patient provided with handout reviewing treatment course and side effects and advised to call or message [place] on MyChart with any concerns. ? ? ?5-Fluorouracil/Calcipotriene Patient Education  ? ?Actinic keratoses are the dry, red scaly spots on the skin caused by sun damage. A portion of these spots can turn into skin cancer with time, and treating them can help prevent development of skin cancer.  ? ?Treatment of these spots requires removal of the defective skin cells. There are various ways to remove actinic keratoses, including freezing with liquid nitrogen, treatment with creams, or treatment with a blue light procedure in the office.  ? ?5-fluorouracil cream is a topical cream used to treat actinic keratoses. It works by interfering with the growth of  abnormal fast-growing skin cells, such as actinic keratoses. These cells peel off and are replaced by healthy ones.  ? ?5-fluorouracil/calcipotriene is a combination of the 5-fluorouracil cream with a vitamin D analog cream called calcipotriene. The calcipotriene alone does not treat actinic keratoses. However, when it is combined with 5-fluorouracil, it helps the 5-fluorouracil treat the actinic keratoses much faster so that the same results can be achieved with a much shorter treatment time. ? ?INSTRUCTIONS FOR 5-FLUOROURACIL/CALCIPOTRIENE CREAM:  ? ?5-fluorouracil/calcipotriene cream typically only needs to be used for 4-7 days. A thin layer should be applied twice a day to the treatment areas recommended by your physician.  ? ?If your physician prescribed you separate tubes of 5-fluourouracil and calcipotriene, apply a thin layer of 5-fluorouracil followed by a thin layer of calcipotriene.  ? ?Avoid contact with your eyes, nostrils, and mouth. Do not use 5-fluorouracil/calcipotriene cream on infected or open wounds.  ? ?You will develop redness, irritation and some crusting at areas where you have pre-cancer damage/actinic keratoses. IF YOU DEVELOP PAIN, BLEEDING, OR SIGNIFICANT CRUSTING, STOP THE TREATMENT EARLY - you have already gotten a good response and the actinic keratoses should clear up well. ? ?Wash your hands after applying 5-fluorouracil 5% cream on your skin.  ? ?A moisturizer or sunscreen with a minimum SPF 30 should be applied each morning.  ? ?Once you have finished the treatment, you can apply a thin layer of Vaseline twice a day to irritated areas to soothe and calm the areas more quickly. If you experience significant discomfort, contact your physician. ? ?For some patients it is necessary to repeat the treatment for best results. ? ?SIDE  EFFECTS: When using 5-fluorouracil/calcipotriene cream, you may have mild irritation, such as redness, dryness, swelling, or a mild burning sensation. This  usually resolves within 2 weeks. The more actinic keratoses you have, the more redness and inflammation you can expect during treatment. Eye irritation has been reported rarely. If this occurs, please let us know.  ?If you have any trouble using this cream, please call the office. If you have any other questions about this information, please do not hesitate to ask me before you leave the office. ? ? ?If You Need Anything After Your Visit ? ?If you have any questions or concerns for your doctor, please call our main line at [phone] and press option 4 to reach your doctor's medical assistant. If no one answers, please leave a voicemail as directed and we will return your call as soon as possible. Messages left after 4 pm will be answered the following business day.  ? ?You may also send [place] a message via MyChart. We typically respond to MyChart messages within 1-2 business days. ? ?For prescription refills, please ask your pharmacy to contact our office. Our fax number is [phone]. ? ?If you have an urgent issue when the clinic is closed that cannot wait until the next business day, you can page your doctor at the number below.   ? ?Please note that while we do our best to be available for urgent issues outside of office hours, we are not available 24/7.  ? ?If you have an urgent issue and are unable to reach [place], you may choose to seek medical care at your doctor's office, retail clinic, urgent care center, or emergency room. ? ?If you have a medical emergency, please immediately call 911 or go to the emergency department. ? ?Pager Numbers ? ?- Dr. Nehemiah Massed: [phone] ? ?- Dr. Laurence Ferrari: [phone] ? ?- Dr. Nicole Kindred: [phone] ? ?In the event of inclement weather, please call our main line at [phone] for an update on the status of any delays or closures. ? ?Dermatology Medication Tips: ?Please keep the boxes that topical medications come in in order to help keep track of the instructions about where  and how to use these. Pharmacies typically print the medication instructions only on the boxes and not directly on the medication tubes.  ? ?If your medication is too expensive, please contact our office at [phone] option 4 or send [place] a message through [place].  ? ?We are unable to tell what your co-pay for medications will be in advance as this is different depending on your insurance coverage. However, we may be able to find a substitute medication at lower cost or fill out paperwork to get insurance to cover a needed medication.  ? ?If a prior authorization is required to get your medication covered by your insurance company, please allow [place] 1-2 business days to complete this process. ? ?Drug prices often vary depending on where the prescription is filled and some pharmacies may offer cheaper prices. ? ?The website www.goodrx.com contains coupons for medications through different pharmacies. The prices here do not account for what the cost may be with help from insurance (it may be cheaper with your insurance), but the website can give you the price if you did not use any insurance.  ?- You can print the associated coupon and take it with your prescription to the pharmacy.  ?- You may also stop by our office during regular business hours and pick up a GoodRx coupon card.  ?- If  you need your prescription sent electronically to a different pharmacy, notify our office through [facility] or by phone at [phone] option 4. ? ? ? ? ?Si Usted Necesita Algo Despu?s de Su Visita ? ?Tambi?n puede enviarnos un mensaje a trav?s de MyChart. Por lo general respondemos a los mensajes de MyChart en el transcurso de 1 a 2 d?as h?biles. ? ?Para renovar recetas, por favor pida a su farmacia que se ponga en contacto con nuestra oficina. Nuestro n?mero de fax es el [phone]. ? ?Si tiene un asunto urgente cuando la cl?nica est? cerrada y que no puede esperar hasta el siguiente d?a h?bil, puede llamar/localizar a  su doctor(a) al n?mero que aparece a continuaci?n.  ? ?Por favor, tenga en cuenta que aunque hacemos todo lo posible para estar disponibles para asuntos urgentes fuera del horario de oficina, no estamos disponi

## 2021-09-07 NOTE — Progress Notes (Signed)
? ?Follow-Up Visit ?  ?Subjective  ?Joshua Route Sr. is a 66 y.o. male who presents for the following: Follow-up (Patient here today concerning some scaly irritated spots at scalp, face, and arms. He has hx of aks. Patient also would like antibiotic ointment for cuts and scrapes he has from working outside. ). ?The patient has spots, moles and lesions to be evaluated, some may be new or changing and the patient has concerns that these could be cancer. ? ?The following portions of the chart were reviewed this encounter and updated as appropriate:  Tobacco  Allergies  Meds  Problems  Med Hx  Surg Hx  Fam Hx   ?  ?Review of Systems: No other skin or systemic complaints except as noted in HPI or Assessment and Plan. ? ?Objective  ?Well appearing patient in no apparent distress; mood and affect are within normal limits. ? ?A focused examination was performed including scalp, ears, forehead, face. Relevant physical exam findings are noted in the Assessment and Plan. ? ?scalp / b/l ears x 17 (17) ?Erythematous thin papules/macules with gritty scale.  ? ?left forehead x 1 ?Erythematous stuck-on, waxy papule or plaque ? ?b/l arms, legs, body ?Traumatic crusts at arms and legs ? ? ?Assessment & Plan  ?Actinic keratosis (17) ?scalp / b/l ears x 17 ?Discussed  PDT / Start 5-fluorouracil/calcipotriene cream.  ? ?Patient instructed to start June 4 th  ? ?Start 5-fluorouracil/calcipotriene cream twice a day for 7 days to affected areas including scalp / forehead. Prescription sent to [facility]. Patient provided with contact information for pharmacy and advised the pharmacy will mail the prescription to their home. Patient provided with handout reviewing treatment course and side effects and advised to call or message [place] on MyChart with any concerns. ? ?Actinic keratoses are precancerous spots that appear secondary to cumulative UV radiation exposure/sun exposure over time. They are chronic with expected  duration over 1 year. A portion of actinic keratoses will progress to squamous cell carcinoma of the skin. It is not possible to reliably predict which spots will progress to skin cancer and so treatment is recommended to prevent development of skin cancer. ? ?Recommend daily broad spectrum sunscreen SPF 30+ to sun-exposed areas, reapply every 2 hours as needed.  ?Recommend staying in the shade or wearing long sleeves, sun glasses (UVA+UVB protection) and wide brim hats (4-inch brim around the entire circumference of the hat). ?Call for new or changing lesions. ? ?Destruction of lesion - scalp / b/l ears x 17 ?Complexity: simple   ?Destruction method: cryotherapy   ?Informed consent: discussed and consent obtained   ?Timeout:  patient name, date of birth, surgical site, and procedure verified ?Lesion destroyed using liquid nitrogen: Yes   ?Region frozen until ice ball extended beyond lesion: Yes   ?Cryotherapy cycles:  2 ?Outcome: patient tolerated procedure well with no complications   ?Post-procedure details: wound care instructions given   ?Additional details:  Prior to procedure, discussed risks of blister formation, small wound, skin dyspigmentation, or rare scar following cryotherapy. Recommend Vaseline ointment to treated areas while healing. ?Related Medications ?fluorouracil (EFUDEX) 5 % cream ?Apply topically 2 (two) times daily. Apply for 7 days to scalp (start June 4) ? ?Inflamed seborrheic keratosis ?left forehead x 1 ?Will recheck isk at left forehead at next follow up. If persistent reoccurrence will consider bx.  ? ?If persisted reoccurrence will consider bx ? ?Patient has multiple isks at arms, will plan to treat at follow up.  ?  Destruction of lesion - left forehead x 1 ?Complexity: simple   ?Destruction method: cryotherapy   ?Informed consent: discussed and consent obtained   ?Timeout:  patient name, date of birth, surgical site, and procedure verified ?Lesion destroyed using liquid nitrogen: Yes    ?Region frozen until ice ball extended beyond lesion: Yes   ?Outcome: patient tolerated procedure well with no complications   ?Post-procedure details: wound care instructions given   ?Additional details:  Prior to procedure, discussed risks of blister formation, small wound, skin dyspigmentation, or rare scar following cryotherapy. Recommend Vaseline ointment to treated areas while healing. ? ?Traumatic injury ?b/l arms, legs, body ?Patient has multiple excoriations secondary to work injuries outdoors.   ?Start Mupirocin ointment 2% - apply topically to aa's of body qd for any cuts, scrapes and wounds until healed.  ? ?mupirocin ointment (BACTROBAN) 2 % - b/l arms, legs, body ?Apply 1 application. topically daily. Apply to any cuts, scrapes, wounds at body daily until healed. ? ?Actinic Damage - Severe, confluent actinic changes with pre-cancerous actinic keratoses  ?- Severe, chronic, not at goal, secondary to cumulative UV radiation exposure over time ?- diffuse scaly erythematous macules and papules with underlying dyspigmentation ?- Discussed Prescription "Field Treatment" for Severe, Chronic Confluent Actinic Changes with Pre-Cancerous Actinic Keratoses ?Field treatment involves treatment of an entire area of skin that has confluent Actinic Changes (Sun/ Ultraviolet light damage) and PreCancerous Actinic Keratoses by method of PhotoDynamic Therapy (PDT) and/or prescription Topical Chemotherapy agents such as 5-fluorouracil, 5-fluorouracil/calcipotriene, and/or imiquimod.  The purpose is to decrease the number of clinically evident and subclinical PreCancerous lesions to prevent progression to development of skin cancer by chemically destroying early precancer changes that may or may not be visible.  It has been shown to reduce the risk of developing skin cancer in the treated area. As a result of treatment, redness, scaling, crusting, and open sores may occur during treatment course. One or more than one of  these methods may be used and may have to be used several times to control, suppress and eliminate the PreCancerous changes. Discussed treatment course, expected reaction, and possible side effects. ?- Recommend daily broad spectrum sunscreen SPF 30+ to sun-exposed areas, reapply every 2 hours as needed.  ?- Staying in the shade or wearing long sleeves, sun glasses (UVA+UVB protection) and wide brim hats (4-inch brim around the entire circumference of the hat) are also recommended. ?- Call for new or changing lesions. ?Start June 4th ?Start 5-fluorouracil/calcipotriene cream twice a day for 7 days to affected areas including scalp/forehead. Prescription sent to [facility]. Patient provided with contact information for pharmacy and advised the pharmacy will mail the prescription to their home. Patient provided with handout reviewing treatment course and side effects and advised to call or message [place] on MyChart with any concerns. ? ?Return in about 2 months (around 11/07/2021) for ak followup. ?I, Ruthell Rummage, CMA, am acting as scribe for Sarina Ser, MD. ?Documentation: I have reviewed the above documentation for accuracy and completeness, and I agree with the above. ? ?Sarina Ser, MD ? ?

## 2021-09-11 ENCOUNTER — Ambulatory Visit: Payer: HMO | Admitting: Dermatology

## 2021-09-11 NOTE — Unmapped (Signed)
Bernard Skinner 's Repatha shipment will be canceled  as a result of a high copay.     I have reached out to the patient  at [phone] and communicated the delay. We will not reschedule the medication and have removed this/these medication(s) from the work request.  We have canceled this work request.      Pt stated he would his insurance and call [facility] back regarding decision to approve/deny copay, have no heard back from pt. Reached out to pt a couple more times and lvm/sent txt but unable to reach.

## 2021-09-12 ENCOUNTER — Ambulatory Visit: Payer: HMO

## 2021-09-14 ENCOUNTER — Ambulatory Visit: Payer: HMO

## 2021-09-19 ENCOUNTER — Ambulatory Visit: Payer: HMO

## 2021-09-19 DIAGNOSIS — F1190 Opioid use, unspecified, uncomplicated: Principal | ICD-10-CM

## 2021-09-19 MED ORDER — BUPRENORPHINE HCL 8 MG SUBLINGUAL TABLET
ORAL_TABLET | Freq: Two times a day (BID) | SUBLINGUAL | 0 refills | 30 days
Start: 2021-09-19 — End: 2021-11-18

## 2021-09-19 NOTE — Unmapped (Signed)
Patient is requesting the following refill  Requested Prescriptions      No prescriptions requested or ordered in this encounter       Last refill given on: 07/17/2021 with 60 count and 1 refills.     Recent Visits  Date Type Provider Dept   07/17/21 Office Visit Lucille Passy, MD [facility] [address] [facility]   05/18/21 Office Visit Lucille Passy, MD [facility] [address] [facility]   02/22/21 Office Visit Noelle Janeann Forehand, MD [facility] [address] [facility]   12/21/20 Office Visit Noelle Janeann Forehand, MD [facility] [address] [facility]   10/24/20 Office Visit Noelle Janeann Forehand, MD [facility] [address] [place]   Showing recent visits within past 365 days with a meds authorizing provider and meeting all other requirements  Future Appointments  Date Type Provider Dept   10/23/21 Appointment Lucille Passy, MD  [facility] [address] [place]   Showing future appointments within next 365 days with a meds authorizing provider and meeting all other requirements       Opioid Monitoring   Urine Tox Screen Last Drug Screen Date: 07/17/2021  Opiate Confirmation Test Last Drug Screen Date: 07/19/2021  Last Opioid Dispensed Provider: Santiago Glad, PA  Prescribed MEDD : 0  Last PDMP Review: 07/17/2021  2:32 PM  Last Opioid Pain Agreement Signed Date: 12/21/2020  Last Non Opioid Controlled Substance Pain Agreement Signed Date: Not Found    Naloxone Ordered: 02/22/2021  Last OV: 07/17/2021

## 2021-09-19 NOTE — Unmapped (Signed)
Patient called requesting a refill on subutex pharmacy verified.

## 2021-09-19 NOTE — Unmapped (Signed)
Addended by: Lendell Caprice on: 09/19/2021 03:55 PM     Modules accepted: Orders

## 2021-09-20 ENCOUNTER — Encounter: Payer: Self-pay | Admitting: Dermatology

## 2021-09-20 MED ORDER — BUPRENORPHINE HCL 8 MG SUBLINGUAL TABLET
ORAL_TABLET | Freq: Two times a day (BID) | SUBLINGUAL | 0 refills | 30 days | Status: CP
Start: 2021-09-20 — End: 2021-11-19

## 2021-09-20 NOTE — Unmapped (Signed)
Addended by: Lendell Caprice on: 09/20/2021 09:21 AM     Modules accepted: Orders

## 2021-09-20 NOTE — Unmapped (Signed)
Addended by: Arlyss Gandy on: 09/20/2021 09:54 AM     Modules accepted: Orders

## 2021-09-21 ENCOUNTER — Ambulatory Visit: Payer: HMO

## 2021-09-26 ENCOUNTER — Ambulatory Visit: Payer: HMO

## 2021-09-28 ENCOUNTER — Ambulatory Visit: Payer: HMO

## 2021-10-02 NOTE — Addendum Note (Signed)
Encounter addended by: Annie Paras on: 10/02/2021 11:22 AM  Actions taken: Letter saved

## 2021-10-03 ENCOUNTER — Ambulatory Visit: Payer: HMO

## 2021-10-05 ENCOUNTER — Ambulatory Visit: Payer: HMO

## 2021-10-10 ENCOUNTER — Ambulatory Visit: Payer: HMO

## 2021-10-13 NOTE — Unmapped (Signed)
Assessment & Plan        I personally spent 31 minutes face-to-face and non-face-to-face in the care of this patient, which includes all pre, intra, and post visit time on the date of service.            Problem List Items Addressed This Visit       Class 2 obesity     Body mass index is 36.48 kg/m??.  Reports a goal weight of 220 lbs. Up 2 lb since last visit-pt wants to change phentermine capsules to tablets.  Patient reports he has gained 7 lbs since starting phentermine capsules as they make him more hungry, patient wishes to switch to phentermine tablets. Rx sent for phentermine 37.5 mg tablets qAM before breakfast. Of note, his cardiologist has approved phentermine use. Noted mildly high BP today but this is not phentermine SE as pt stopped phentermine ~2wks ago due to not liking capsules.   Of note, he is already on Victoza (from endocrine for DM2) that would help w/wt loss; Previously had long discussion of weight loss effects of other GLP1s esp semaglutide, and Mounjaro. Previously rec'd pt discuss changing Victoza to one of these w/his Endo .  Encouraged weight loss via healthy diet and regular exercise  Wt Readings from Last 3 Encounters:   10/23/21 (!) 112.1 kg (247 lb 0.6 oz)   07/17/21 (!) 111.1 kg (245 lb)   06/07/21 (!) 110.9 kg (244 lb 9.6 oz)          Relevant Medications    phentermine (ADIPEX-P) 37.5 mg tablet    Insomnia     Stable/well controlled on occasional ambien 10 mg 1/2-1 tab qhs PRN. In the past, #30 tabs would last 68yr. Last PDMP entry 12/2020 so #30 has lasted ~89mos. Refilled today.  Continue ambien 10 mg PRN for insomnia as prescribed.  Controlled substance contract signed 12/21/20  PDMP today shows buprenorphine 30 day supply last filled 09/20/21, zolpidem 30 pills last filled  12/21/20, phentermine 90 pills last filled  07/19/20, testosterone last filled 07/11/21  Previously discussed SE of frequent use (daytime sleepiness, fatigue, confusion, etc)  If pt begins to need a sleep medication daily, will try an alternative medication (doxepin, trazodone).         Relevant Medications    zolpidem (AMBIEN) 10 mg tablet    Opioid use disorder - Primary     Hx of opioid pill abuse, starting after prescribed opioids for knee/shoulder surgeries.  Hx rare MJ use in social situations and regular CBD oil use, so expect UDS + cannabinoid. Stable on Subutex now x several yrs, well controlled on recent (1/23) decrease dose to 1 tab bid.   Pt strongly desires to gradually continue taper down buprenorphine dose. We have previously discussed the risks of tapering down buprenorphine including relapse and overdose.   Decreased subutex 8 mg (1 tab) SL BID to 1 tab in am and 1/2 tab in evening. Pt will watch for withdrawal sxs/cravings and urged patient to contact me if this occurs and could return to 1 tab bid or perhaps 1 tab in am and 3/4 tab in pm.  Has rx narcan nasal spray   Controlled substance contract signed 12/21/20  UDS collected today, expect + cannabinoid as patient reports he has recently smoked marijuana. Encouraged him to avoid MJ and discussed possible risks of using this w/Subutex  PDMP today shows buprenorphine 30 day supply last filled 09/20/21, zolpidem 30 pills last filled  12/21/20, phentermine 90  pills last filled  07/19/20, testosterone last filled 07/11/21 (testosterone filled by outside provider)  Plan f/u in 30d given decreasing dose           Relevant Medications    buprenorphine HCL (SUBUTEX) 8 mg tablet    Other Relevant Orders    Benzodiazepines, Conf, Urine    Opiates confirmation, random, urine    Toxicology Screen, Urine    Smoker     CT lung ca screening done in [facility] 5/22-pt is seeing his specialist tomorrow  so anticipate this may be ordered then. next visit will check if this has been repeated and if not can order          Other Visit Diagnoses       High risk medication use        Relevant Orders    Benzodiazepines, Conf, Urine    Opiates confirmation, random, urine Toxicology Screen, Urine             Return for f/u Subutex w/UDS (within 30 days)-telemedicine ok.     Medication adherence and barriers to the treatment plan have been addressed. Opportunities to optimize healthy behaviors have been discussed. Patient / caregiver voiced understanding.      Subjective:     Bernard Skinner is a 66 y.o. male who presents for Follow-up            History of Present Illness          Patient presents for CDM follow-up. Last seen by me 07/17/21. Obesity - Patient seeks to restart phentermine,discussed this is usually contraindicated in setting of CAD. I will contact Dr. Adrian Blackwater to see if this is acceptable.   Of note, he is already on Jardiance and Victoza (from endocrine for DM2) that would help w/wt loss; Long discussion of weight loss effects of other GLP1s esp semaglutide, and Mounjaro. Rec'd pt discuss changing Victoza to one of these w/his Endocrinology. Encouraged weight loss via healthy diet and regular exercise. Opioid use disorder - t strongly desires to gradually taper down buprenorphine dose, but wants to keep same dose until next visit. Continue 8 mg (1 tab) SL BID. Next visit could try decreasing to 1 tab in am and 1/2 tab in evening. We have discussed the risks of tapering down buprenorphine including relapse and overdose. Has rx narcan nasal spray. Controlled substance contract signed 12/21/20. UDS collected today. PDMP today shows 22 day supply of subutex filled 06/22/21, 7 day supply filled 05/18/21 (indicating he likely uses only 1 tab daily many days). Continues to be prescribed testosterone from an outside provider. Plan f/u in 2 mo (q60d OVs approp as pt stable on med long term and UDSs stable since establishing care w/me). Smoker - CT lung ca screening done in [facility] 5/22. Recent weight gain likely due to smoking less, discussed nicotine effect of appetite suppression, urged healthy diet/exercise to offset wt gain as he cuts down cigarette use. Continue Chantix 1mg  BID. Congratulated on smoking cessation efforts esp in setting of CAD    07/18/21 call to see if patient can take phentermine    09/19/21 call for refills of subutex        PDMP today shows buprenorphine 30 day supply last filled 09/20/21, zolpidem 30 pills last filled  12/21/20, phentermine 90 pills last filled  07/19/20, testosterone last filled 07/11/21.       Patient reports he wants the phentermine capsules changed to pills. Patient reports he  has gained 7 lbs since taking the capsules. Patient notes he is continously hungry when taking the capsules. Patient reports his son's wife reported similar sxs when on the capsules. Patient notes he is seeing Endocrinology later this week. Patient indicates he has quit taking jardiance as he cannot figure out why he feels so bad. Patient reports he feels lethargic in the afternoons. Notes he has a glucometer and has post-prandial sugars running generally from 175-200 in afternoons . Patient endorses using his CPAP regularly for his sleep apnea. Requesting diabetic eye exam records. Patient notes his CPAP needs new equipment, as he has had it for 7-8 years and it has recently started to malfunction. Patient notes he is still taking as many medications now as when he was 150 lbs heavier. Discussed how diabetes can worsen as one ages. Patient endorses smoking marijuana recently in the past couples week. Patient reports he has been able to have bowel movements daily. Patient wishes to decrease subutex, discussed risks and precautions.   Pt mentions his son Holland Falling has just started a 28d inpt rehab program at [facility] in [place] for ETOH abuse, he is very hopeful this will help Holland Falling a lot          Hemoglobin A1C (%)   Date Value   02/07/2018 7.3     LDL Calculated (mg/dL)   Date Value   09/81/1914 107 (H)   02/03/2018 76     BP Readings from Last 3 Encounters:   10/23/21 146/77   07/17/21 129/81   06/07/21 118/82     Wt Readings from Last 3 Encounters:   10/23/21 (!) 112.1 kg (247 lb 0.6 oz)   07/17/21 (!) 111.1 kg (245 lb)   06/07/21 (!) 110.9 kg (244 lb 9.6 oz)           Review of Systems     History obtained from the patient and chart review     Unless otherwise stated in the HPI:  CONSTITUTIONAL: no f/c/s  HEENT: Eyes: No diplopia or blurred vision. ENT: No earache, sore throat or runny nose.   CARDIOVASCULAR: No pressure, squeezing, strangling, tightness, heaviness or aching about the chest, neck, axilla or epigastrium.   RESPIRATORY: No cough, shortness of breath, PND or orthopnea.   GASTROINTESTINAL: No nausea, vomiting or diarrhea.   GENITOURINARY: No dysuria, frequency or urgency.   MUSCULOSKELETAL: As per HPI.   SKIN: No change in skin, hair or nails.   NEUROLOGIC: No paresthesias, fasciculations, seizures or weakness.   PSYCHIATRIC: No disorder of thought or mood.   ENDOCRINE: No heat or cold intolerance, polyuria or polydipsia.   HEMATOLOGICAL: No easy bruising or bleeding.            Review of History     MEDICATIONS:        Current Outpatient Medications on File Prior to Visit   Medication Sig Dispense Refill    amLODIPine (NORVASC) 10 MG tablet       aspirin (ECOTRIN) 81 MG tablet Take 1 tablet (81 mg total) by mouth daily. 30 tablet 11    cetirizine (ZYRTEC) 10 MG tablet TAKE 1 TABLET BY MOUTH ONCE DAILY 90 tablet 3    clopidogreL (PLAVIX) 75 mg tablet Take 1 tablet (75 mg total) by mouth daily. 30 tablet 11    coenzyme Q10 100 mg capsule Take 1 capsule (100 mg total) by mouth.      diazePAM (VALIUM) 5 MG tablet Take 0.5 tablets (2.5 mg total) by  mouth.      evolocumab (REPATHA SURECLICK) 140 mg/mL PnIj Inject the contents of one pen (140 mg) under the skin every fourteen (14) days. 6 mL 3    ezetimibe (ZETIA) 10 mg tablet Take 1 tablet (10 mg total) by mouth daily.      flash glucose sensor kit 1 each.      insulin ASPART (NOVOLOG FLEXPEN) 100 unit/mL (3 mL) injection pen Inject up to 8 units three times daily before meals, as directed insulin glargine U-300 conc (TOUJEO SOLOSTAR) 300 unit/mL (1.5 mL) injection pen Toujeo SoloStar U-300 Insulin 300 unit/mL (1.5 mL) subcutaneous pen      JARDIANCE 25 mg tablet       lisinopriL-hydrochlorothiazide (PRINZIDE,ZESTORETIC) 10-12.5 mg per tablet       naloxone (NARCAN) 4 mg nasal spray One spray in either nostril once for known/suspected opioid overdose. May repeat every 2-3 minutes in alternating nostril til EMS arrives 2 each 0    nitroglycerin (NITROSTAT) 0.3 MG SL tablet       ondansetron (ZOFRAN) 8 MG tablet Take 1 tablet (8 mg total) by mouth Two (2) times a day. 180 tablet 3    pantoprazole (PROTONIX) 40 MG tablet       testosterone cypionate (DEPOTESTOTERONE CYPIONATE) 200 mg/mL injection   0    timolol (TIMOPTIC) 0.5 % ophthalmic solution   4    VASCEPA 1 gram cap TAKE 2 CAPSULES BY MOUTH TWICE A DAY 360 capsule 0    venlafaxine (EFFEXOR-XR) 37.5 MG 24 hr capsule Take 2 capsules (75 mg total) by mouth daily. 180 capsule 3    VICTOZA 3-PAK 0.6 mg/0.1 mL (18 mg/3 mL) injection Inject 0.3 mL (1.8 mg total) under the skin daily.  4    vit C/E/Zn/coppr/lutein/zeaxan (PRESERVISION AREDS 2 ORAL) Take 2 tablets by mouth.      fluticasone propionate (FLONASE) 50 mcg/actuation nasal spray 1 spray into each nostril Two (2) times a day. 32 g 11    metFORMIN (GLUCOPHAGE) 1000 MG tablet Take 1 tablet (1,000 mg total) by mouth in the morning and 1 tablet (1,000 mg total) in the evening. Take with meals. 60 tablet 11    NEXLIZET 180-10 mg Tab  (Patient not taking: Reported on 10/23/2021)      [EXPIRED] phentermine 37.5 MG capsule Take 1 capsule (37.5 mg total) by mouth every morning. 90 capsule 0     No current facility-administered medications on file prior to visit.        ALLERGIES:      Statins-hmg-coa reductase inhibitors         PAST MEDICAL HISTORY:     Past Medical History:   Diagnosis Date    Coronary artery disease     Diabetes mellitus (CMS-HCC)     Diverticulosis     Hyperlipidemia     statin intolerant    Hypertension     Opioid abuse, in remission (CMS-HCC) 05/23/2018    Opioid use disorder     on subutex , stable as of 12/19       PAST SURGICAL HISTORY:     Past Surgical History:   Procedure Laterality Date    APPENDECTOMY      arthroscopic rotator cuff repair Right 07/27/2013    FRACTURE SURGERY Right     ORIF R tibial plateau fracdture    LEFT COLECTOMY  1977    PR PRQ TRLUML CORONARY STENT W/ANGIO ONE ART/BRNCH N/A 05/03/2021    Procedure: CTO PCI;  Surgeon:  Marlaine Hind, MD;  Location: [facility] CATH;  Service: Cardiology    TONSILLECTOMY         PROBLEM LIST:     Patient Active Problem List    Diagnosis Date Noted    Dyspnea on exertion 04/24/2021    Fatigue 12/21/2020    Gross hematuria 10/24/2020    Disorder of rotator cuff syndrome of left shoulder and allied disorder 08/29/2020    Sputum production 05/11/2020    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 (CMS-HCC) 04/16/2020    Rhinorrhea 02/22/2020    Nonintractable headache 12/23/2019    GAD (generalized anxiety disorder) 09/17/2019    Chronic nausea 09/17/2019    Insomnia 09/01/2018    Routine general medical examination at a health care facility 07/03/2018    PND (post-nasal drip) 07/03/2018    Cough 07/03/2018    Class 2 obesity 07/03/2018    Screening for colon cancer 05/25/2018    Adenomatous polyp of colon 04/11/2018    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    Eye disorder 04/11/2018    Smoker 04/11/2018    Opioid use disorder 04/10/2018    Hyperlipidemia 04/09/2018    Back pain at L4-L5 level 02/03/2018    Hypogonadism in male 04/11/2016    CAD (coronary artery disease) 04/06/2015    Sleep apnea 04/06/2015    Erectile dysfunction 02/16/2014    Essential hypertension 02/14/2014    Long-term insulin use (CMS-HCC) 02/14/2014    Type 2 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 He started smoking about 17 years ago. He has been smoking an average of .5 packs per day. He has never used smokeless tobacco. He reports current alcohol use. He reports current drug use. Drug: Marijuana.   FAMILY HISTORY:     family history includes Alcohol abuse in his father and son; Diabetes in his paternal aunt and son; Drug abuse in his son; Liver disease in his father; Mental illness in his son.       Objective:    BP 146/77  - Pulse 73  - Temp 36.2 ??C (97.2 ??F)  - Wt (!) 112.1 kg (247 lb 0.6 oz)  - BMI 36.48 kg/m??  - Body mass index is 36.48 kg/m??.    Physical exam:    General:  Well appearing, well nourished in no distress.   Skin: no obvious rash or  prominent lesions    Cardiovascular: no obvious JVD  Eyes:  conjunctiva clear, sclera non-icteric   Ears/nose/throat: no obvious external deformities  Respiratory: no distress    Musculoskeletal:  Normal gait and station.  Neurologic: moves extremities symmetrically, cranial nerves grossly intact  Hematologic: no obvious ecchymosis     Psychiatric:  Oriented X3, intact judgement and insight, normal mood and affect.              No results found for this or any previous visit (from the past 24 hour(s)).     I attest that I, Paulino Rily, personally documented this note while acting as scribe for Arlyss Gandy, MD.      Paulino Rily, Scribe.  10/23/2021  The documentation recorded by the scribe accurately reflects the service I personally performed and the decisions made by me.    Arlyss Gandy, MD

## 2021-10-22 MED ORDER — BUPRENORPHINE HCL 8 MG SUBLINGUAL TABLET
ORAL_TABLET | Freq: Two times a day (BID) | SUBLINGUAL | 0 refills | 30 days
Start: 2021-10-22 — End: 2021-12-21

## 2021-10-23 ENCOUNTER — Ambulatory Visit: Admit: 2021-10-23 | Discharge: 2021-10-24 | Payer: PRIVATE HEALTH INSURANCE

## 2021-10-23 DIAGNOSIS — F17200 Nicotine dependence, unspecified, uncomplicated: Principal | ICD-10-CM

## 2021-10-23 DIAGNOSIS — E669 Obesity, unspecified: Principal | ICD-10-CM

## 2021-10-23 DIAGNOSIS — Z79899 Other long term (current) drug therapy: Principal | ICD-10-CM

## 2021-10-23 DIAGNOSIS — F1190 Opioid use, unspecified, uncomplicated: Principal | ICD-10-CM

## 2021-10-23 DIAGNOSIS — G4700 Insomnia, unspecified: Principal | ICD-10-CM

## 2021-10-23 LAB — TOXICOLOGY SCREEN, URINE
AMPHETAMINE SCREEN URINE: NEGATIVE
BARBITURATE SCREEN URINE: NEGATIVE
BENZODIAZEPINE SCREEN, URINE: NEGATIVE
BUPRENORPHINE, URINE SCREEN: POSITIVE — AB
CANNABINOID SCREEN URINE: POSITIVE — AB
COCAINE(METAB.)SCREEN, URINE: NEGATIVE
FENTANYL SCREEN, URINE: NEGATIVE
METHADONE SCREEN, URINE: NEGATIVE
OPIATE SCREEN URINE: NEGATIVE
OXYCODONE SCREEN URINE: NEGATIVE

## 2021-10-23 MED ORDER — BUPRENORPHINE HCL 8 MG SUBLINGUAL TABLET
ORAL_TABLET | 0 refills | 30 days | Status: CP
Start: 2021-10-23 — End: 2021-11-22

## 2021-10-23 MED ORDER — PHENTERMINE 37.5 MG TABLET
ORAL_TABLET | Freq: Every day | ORAL | 0 refills | 90 days | Status: CP
Start: 2021-10-23 — End: 2022-01-21

## 2021-10-23 MED ORDER — ZOLPIDEM 10 MG TABLET
ORAL_TABLET | 0 refills | 0 days | Status: CP
Start: 2021-10-23 — End: ?

## 2021-10-23 NOTE — Unmapped (Addendum)
Stable/well controlled on occasional ambien 10 mg 1/2-1 tab qhs PRN. In the past, #30 tabs would last 64yr. Last PDMP entry 12/2020 so #30 has lasted ~25mos. Refilled today.  Continue ambien 10 mg PRN for insomnia as prescribed.  Controlled substance contract signed 12/21/20  PDMP today shows buprenorphine 30 day supply last filled 09/20/21, zolpidem 30 pills last filled  12/21/20, phentermine 90 pills last filled  07/19/20, testosterone last filled 07/11/21  Previously discussed SE of frequent use (daytime sleepiness, fatigue, confusion, etc)  If pt begins to need a sleep medication daily, will try an alternative medication (doxepin, trazodone).

## 2021-10-23 NOTE — Unmapped (Addendum)
Body mass index is 36.48 kg/m??.  Reports a goal weight of 220 lbs. Up 2 lb since last visit-pt wants to change phentermine capsules to tablets.  Patient reports he has gained 7 lbs since starting phentermine capsules as they make him more hungry, patient wishes to switch to phentermine tablets. Rx sent for phentermine 37.5 mg tablets qAM before breakfast. Of note, his cardiologist has approved phentermine use. Noted mildly high BP today but this is not phentermine SE as pt stopped phentermine ~2wks ago due to not liking capsules.   Of note, he is already on Victoza (from endocrine for DM2) that would help w/wt loss; Previously had long discussion of weight loss effects of other GLP1s esp semaglutide, and Mounjaro. Previously rec'd pt discuss changing Victoza to one of these w/his Endo .  Encouraged weight loss via healthy diet and regular exercise  Wt Readings from Last 3 Encounters:   10/23/21 (!) 112.1 kg (247 lb 0.6 oz)   07/17/21 (!) 111.1 kg (245 lb)   06/07/21 (!) 110.9 kg (244 lb 9.6 oz)

## 2021-10-23 NOTE — Unmapped (Addendum)
Hx of opioid pill abuse, starting after prescribed opioids for knee/shoulder surgeries.  Hx rare MJ use in social situations and regular CBD oil use, so expect UDS + cannabinoid. Stable on Subutex now x several yrs, well controlled on recent (1/23) decrease dose to 1 tab bid.   Pt strongly desires to gradually continue taper down buprenorphine dose. We have previously discussed the risks of tapering down buprenorphine including relapse and overdose.   Decreased subutex 8 mg (1 tab) SL BID to 1 tab in am and 1/2 tab in evening. Pt will watch for withdrawal sxs/cravings and urged patient to contact me if this occurs and could return to 1 tab bid or perhaps 1 tab in am and 3/4 tab in pm.  Has rx narcan nasal spray   Controlled substance contract signed 12/21/20  UDS collected today, expect + cannabinoid as patient reports he has recently smoked marijuana. Encouraged him to avoid MJ and discussed possible risks of using this w/Subutex  PDMP today shows buprenorphine 30 day supply last filled 09/20/21, zolpidem 30 pills last filled  12/21/20, phentermine 90 pills last filled  07/19/20, testosterone last filled 07/11/21 (testosterone filled by outside provider)  Plan f/u in 30d given decreasing dose

## 2021-10-23 NOTE — Unmapped (Addendum)
CT lung ca screening done in [facility] 5/22-pt is seeing his specialist tomorrow  so anticipate this may be ordered then. next visit will check if this has been repeated and if not can order

## 2021-10-25 LAB — OPIATE, URINE, QUANTITATIVE
6-MONOACETYLMRPH: <5 ng/mL (ref <5)
BUPRENORPHINE: 369 ng/mL — ABNORMAL HIGH (ref <5)
CODEINE GC/MS CONF: <25 ng/mL (ref <25)
FENTANYL, URINE GC/MS: <0.5 ng/mL (ref <0.5)
HYDROCODONE GC/MS CONF: <25 ng/mL (ref <25)
HYDROMORPHONE GC/MS CONF: <25 ng/mL (ref <25)
MORPHINE GC/MS CONF: <25 ng/mL (ref <25)
NORBUPRENORPHINE: 976 ng/mL — ABNORMAL HIGH (ref <5)
NORFENTANYL, UR GC/MS: <1 ng/mL (ref <1)
OPIATE INTERP: POSITIVE
OXYCODONE (GC/MS): <25 ng/mL (ref <25)
OXYMORPHONE: <25 ng/mL (ref <25)

## 2021-10-27 LAB — BENZODIAZEPINES, CONF, URINE
2-HYDROXY ETHYL FLURAZEPAM BY MS CONFIRM: NEGATIVE ng/mL
ALPHA-HYDROXY MIDAZOLAM BY MS CONFIRM: NEGATIVE ng/mL
ALPRAZOLAM BY MS CONFIRM: NEGATIVE ng/mL
BENZODIAZEPINES INTERPRETATION: NEGATIVE
CHLORDIAZEPOXIDE BY MS CONFIRM: NEGATIVE ng/mL
CLOBAZAM BY MS CONFIRM: NEGATIVE ng/mL
CLONAZEPAM BY MS CONFIRM: NEGATIVE ng/mL
DIAZEPAM BY MS CONFIRM: NEGATIVE ng/mL
FLUNITRAZEPAM BY MS CONFIRM: NEGATIVE ng/mL
FLURAZEPAM BY MS CONFIRM: NEGATIVE ng/mL
MIDAZOLAM BY MS CONFIRM: NEGATIVE ng/mL
N-DESMETHYLCLOBAZAM BY MS CONFIRM: NEGATIVE ng/mL
PRAZEPAM BY MS CONFIRM: NEGATIVE ng/mL
TEMAZEPAM-BY GC/MS: NEGATIVE ng/mL
TRIAZOLAM BY MS CONFIRM: NEGATIVE ng/mL
ZOLPIDEM BY MS CONFIRM: NEGATIVE ng/mL
ZOLPIDEM PHENYL-4-CARBOXYLIC ACID BY MS CONFIRM: NEGATIVE ng/mL

## 2021-11-01 ENCOUNTER — Telehealth: Payer: Self-pay | Admitting: Acute Care

## 2021-11-01 NOTE — Telephone Encounter (Signed)
Attempted to reach pt to schedule annual LDCT-LVMM

## 2021-11-05 DIAGNOSIS — J3489 Other specified disorders of nose and nasal sinuses: Principal | ICD-10-CM

## 2021-11-05 MED ORDER — CETIRIZINE 10 MG TABLET
ORAL_TABLET | 3 refills | 0 days
Start: 2021-11-05 — End: ?

## 2021-11-06 MED ORDER — CETIRIZINE 10 MG TABLET
ORAL_TABLET | 3 refills | 0 days | Status: CP
Start: 2021-11-06 — End: ?

## 2021-11-18 NOTE — Unmapped (Signed)
TeleHealth Video/Phone Encounter  This medical encounter was conducted virtually using [email or web address]  TeleHealth protocols.      I have identified myself to the patient and conveyed my credentials to Bernard Skinner  I have explained the capabilities and limitations of telemedicine and the patient and myself both agree that it is appropriate for their current circumstances/symptoms.  In case we get disconnected, patient's phone number is [phone] (home) [phone] (work)   The patient's location is in [place]   Is there someone else in the room? No.   Pt either has signed informed consent on file in medical record or gives verbal consent for a telehealth encounter and general treatment.        The patient reports they are currently: at home. I spent 15 minutes on the phone with the patient on the date of service. I spent an additional 10 minutes on pre- and post-visit activities on the date of service.     The patient was physically located in [place] or a state in which I am permitted to provide care. The patient and/or parent/guardian understood that s/he may incur co-pays and cost sharing, and agreed to the telemedicine visit. The visit was reasonable and appropriate under the circumstances given the patient's presentation at the time.    The patient and/or parent/guardian has been advised of the potential risks and limitations of this mode of treatment (including, but not limited to, the absence of in-person examination) and has agreed to be treated using telemedicine. The patient's/patient's family's questions regarding telemedicine have been answered.     If the visit was completed in an ambulatory setting, the patient and/or parent/guardian has also been advised to contact their provider???s office for worsening conditions, and seek emergency medical treatment and/or call 911 if the patient deems either necessary.               Assessment & Plan              Problem List Items Addressed This Visit Class 2 obesity     Previous BMI of 36.48, patient seen via telehealth today so unable to calculate BMI  Reports a goal weight of 220 lbs. Patient tolerating phentermine tablets well and reports they are helping to decrease his appetite. Of note, capsules do not suppress appetite so will stay w/tablets.  Continue phentermine tablets 37.5 mg qAM before breakfast  Advised patient to return this week for NV to check weight and BP  Of note, his cardiologist has approved phentermine use  Encouraged weight loss via healthy diet and regular exercise-rec'd start going to gym if wt plateaus  Wt Readings from Last 3 Encounters:   10/23/21 (!) 112.1 kg (247 lb 0.6 oz)   07/17/21 (!) 111.1 kg (245 lb)   06/07/21 (!) 110.9 kg (244 lb 9.6 oz)          High risk medication use     See Opioid use disorder tab         Relevant Orders    Benzodiazepines, Conf, Urine    Opiates confirmation, random, urine    Toxicology Screen, Urine    Opioid use disorder - Primary     Hx of opioid pill abuse, starting after prescribed opioids for knee/shoulder surgeries.  Hx rare MJ use in social situations and regular CBD oil use, so expect UDS + cannabinoid. Stable on Subutex now x several yrs, pt wanting to gradually taper down. Overall well controlled on recent (6/23) decrease dose to 1  tab qAM and 1/2 tab qPM, but having mild cravings on this dose so does not want to taper down further.   Continue subutex 8 mg (1 tab) in am and 1/2 tab in evening. Pt will watch for withdrawal sxs/cravings and urged patient to contact me if this occurs and could return to 1 tab bid or perhaps 1 tab in am and 3/4 tab in pm.We again discussed the risks of tapering down buprenorphine including relapse and overdose.   Has rx narcan nasal spray   Controlled substance contract signed 12/21/20-update next visit  Advised patient to schedule NV for UDS this week,  PDMP today shows 30 day supply of subutex, 90 day supply of phentermine and 30 day supply of ambien 10/23/21  Plan f/u in 2 mos           Relevant Medications    buprenorphine HCL (SUBUTEX) 8 mg tablet    Other Relevant Orders    Benzodiazepines, Conf, Urine    Opiates confirmation, random, urine    Toxicology Screen, Urine    Smoker     The patient meets eligibility criteria including age, smoking history (20+ pack years), and, if former smoker, quit in the last 15 years, and absence of signs or symptoms of lung cancer     We conducted a shared decision-making process using a decision aid. We reviewed benefits and harms of screening, including false positives and potential need for additional diagnostic testing, the possibility of over diagnosis, and total radiation exposure.      We discussed the importance of adhering to annual LDCT screening. We also discussed the impact of comorbidities on the patient???s the ability or willingness to undergo diagnostic procedure(s) and treatment     We have discussed the importance of achieving/maintaining abstinence from cigarette smoking.    CT lung ca screening done in [facility] 5/22-pt is saw his specialist 10/24/21, but CT was not ordered.     Based on our discussion, we have decided to continue CT lung cancer screening          Relevant Orders    CT Lung Cancer Screening     Other Visit Diagnoses       Screening for malignant neoplasm of respiratory organ        Relevant Orders    CT Lung Cancer Screening    Nicotine dependence, cigarettes, uncomplicated        Relevant Orders    CT Lung Cancer Screening             Return for NV within ~1wk for urine/BP/weight.  Office visit to f/u Subutex/phentermine w/UDS (within 60 days).     Medication adherence and barriers to the treatment plan have been addressed. Opportunities to optimize healthy behaviors have been discussed. Patient / caregiver voiced understanding.      Subjective:     Bernard Skinner is a 66 y.o. male who presents for No chief complaint on file.            History of Present Illness          Patient presents for CDM follow-up. Last seen by me 10/23/21. Class 2 obesity - Patient reports he has gained 7 lbs since starting phentermine capsules as they make him more hungry, patient wishes to switch to phentermine tablets. Rx sent for phentermine 37.5 mg tablets qAM before breakfast. Of note, his cardiologist has approved phentermine use. Noted mildly high BP today but this is not phentermine  SE as pt stopped phentermine ~2wks ago due to not liking capsules. Of note, he is already on Victoza (from endocrine for DM2) that would help w/wt loss; Previously had long discussion of weight loss effects of other GLP1s esp semaglutide, and Mounjaro. Previously rec'd pt discuss changing Victoza to one of these w/his Endocrinology. Encouraged weight loss via healthy diet and exercise. Insomnia - Continue ambien 10 mg PRN for insomnia as prescribed. Controlled substance contract signed 12/21/20. PDMP today shows buprenorphine 30 day supply last filled 09/20/21, zolpidem 30 pills last filled  12/21/20, phentermine 90 pills last filled  07/19/20, testosterone last filled 07/11/21. Previously discussed SE of frequent use (daytime sleepiness, fatigue, confusion, etc). If pt begins to need a sleep medication daily, will try an alternative medication (doxepin, trazodone). Opioid use disorder -  Pt strongly desires to gradually continue taper down buprenorphine dose. We have previously discussed the risks of tapering down buprenorphine including relapse and overdose. Decreased subutex 8 mg (1 tab) SL BID to 1 tab in am and 1/2 tab in evening. Pt will watch for withdrawal sxs/cravings and urged patient to contact me if this occurs and could return to 1 tab bid or perhaps 1 tab in am and 3/4 tab in pm. Has rx narcan nasal spray. Controlled substance contract signed 8/17/2. UDS collected today, expect + cannabinoid as patient reports he has recently smoked marijuana. Encouraged him to avoid MJ and discussed possible risks of using this w/Subutex. PDMP today shows buprenorphine 30 day supply last filled 09/20/21, zolpidem 30 pills last filled  12/21/20, phentermine 90 pills last filled  07/19/20, testosterone last filled 07/11/21 (testosterone filled by outside provider). Plan f/u in 30d given decreasing dose. Smoker - CT lung ca screening done in [facility] 5/22-pt is seeing his specialist tomorrow  so anticipate this may be ordered then. next visit will check if this has been repeated and if not can order      Patient reports he received a shoulder CSI today was unable to make it in person into the clinic. Patient reports he takes 1/2 tab in the morning, 1/2 at lunch and 1/2 at approximately 3pm. Patient reports on average this prevents his opioid cravings but has had days where he has cravings in the afternoon. Patient reports he has resisted these cravings and reports it is better for him to continue to titrate down on this medication. Patient reports when he has cravings he will sometimes just go to bed. Strongly advised patient to let me know if cravings persist and will increase dosage. Patient reports he has an upcoming carpal tunnel surgery, advised that he can take opioid pain meds after surgery as long as he continues subutex. Patient reports the tablets of phentermine are working far better than the capsules were. Patient reports he has not weighed himself recently but reports he feels that he is losing weight. Patient notes he has improved his diet, especially with beverages and is not eating 3 meals a day. 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 Advised patient to consider starting going to the gym if his weight does not come down. Discussed how patient is due for CT Lung scan. Patient previously completed this at [facility], advised him to be on lookout for this and scheduling it again. Patient is still taking medications as prescribed and tolerating them well without side effects.    Taking med regularly- yes  Symptoms of withdrawal- no  Symptoms of opioid cravings-  mild  Medication side effects- no  Attending counseling- no  Recent relapses- no  Other illicit substance use- no             Hemoglobin A1C (%)   Date Value   02/07/2018 7.3     LDL Calculated (mg/dL)   Date Value   30/16/0109 107 (H)   02/03/2018 76     BP Readings from Last 3 Encounters:   10/23/21 146/77   07/17/21 129/81   06/07/21 118/82     Wt Readings from Last 3 Encounters:   10/23/21 (!) 112.1 kg (247 lb 0.6 oz)   07/17/21 (!) 111.1 kg (245 lb)   06/07/21 (!) 110.9 kg (244 lb 9.6 oz)           Review of Systems     History obtained from the patient and chart review     Unless otherwise stated in the HPI:  CONSTITUTIONAL: no f/c/s  HEENT: Eyes: No diplopia or blurred vision. ENT: No earache, sore throat or runny nose.   CARDIOVASCULAR: No pressure, squeezing, strangling, tightness, heaviness or aching about the chest, neck, axilla or epigastrium.   RESPIRATORY: No cough, shortness of breath, PND or orthopnea.   GASTROINTESTINAL: No nausea, vomiting or diarrhea.   GENITOURINARY: No dysuria, frequency or urgency.   MUSCULOSKELETAL: As per HPI.   SKIN: No change in skin, hair or nails.   NEUROLOGIC: No paresthesias, fasciculations, seizures or weakness.   PSYCHIATRIC: No disorder of thought or mood.   ENDOCRINE: No heat or cold intolerance, polyuria or polydipsia.   HEMATOLOGICAL: No easy bruising or bleeding.            Review of History     MEDICATIONS:        Current Outpatient Medications on File Prior to Visit   Medication Sig Dispense Refill    amLODIPine (NORVASC) 10 MG tablet       aspirin (ECOTRIN) 81 MG tablet Take 1 tablet (81 mg total) by mouth daily. 30 tablet 11    BRILINTA 60 mg Tab       cetirizine (ZYRTEC) 10 MG tablet TAKE 1 TABLET BY MOUTH ONCE DAILY 90 tablet 3    clopidogreL (PLAVIX) 75 mg tablet Take 1 tablet (75 mg total) by mouth daily. 30 tablet 11    coenzyme Q10 100 mg capsule Take 1 capsule (100 mg total) by mouth.      diazePAM (VALIUM) 5 MG tablet Take 0.5 tablets (2.5 mg total) by mouth.      evolocumab (REPATHA SURECLICK) 140 mg/mL PnIj Inject the contents of one pen (140 mg) under the skin every fourteen (14) days. 6 mL 3    ezetimibe (ZETIA) 10 mg tablet Take 1 tablet (10 mg total) by mouth daily.      fluorouraciL (EFUDEX) 5 % cream Apply topically.      insulin ASPART (NOVOLOG FLEXPEN) 100 unit/mL (3 mL) injection pen Inject up to 8 units three times daily before meals, as directed      insulin glargine U-300 conc (TOUJEO SOLOSTAR) 300 unit/mL (1.5 mL) injection pen Toujeo SoloStar U-300 Insulin 300 unit/mL (1.5 mL) subcutaneous pen      lisinopriL-hydrochlorothiazide (PRINZIDE,ZESTORETIC) 10-12.5 mg per tablet       metFORMIN (GLUCOPHAGE) 1000 MG tablet Take 1 tablet (1,000 mg total) by mouth in the morning and 1 tablet (1,000 mg total) in the evening. Take with meals. 60 tablet 11    mupirocin (BACTROBAN) 2 % ointment Apply topically.  naloxone (NARCAN) 4 mg nasal spray One spray in either nostril once for known/suspected opioid overdose. May repeat every 2-3 minutes in alternating nostril til EMS arrives 2 each 0    ondansetron (ZOFRAN) 8 MG tablet Take 1 tablet (8 mg total) by mouth Two (2) times a day. 180 tablet 3    phentermine (ADIPEX-P) 37.5 mg tablet Take 1 tablet (37.5 mg total) by mouth daily before breakfast. 90 tablet 0    testosterone cypionate (DEPOTESTOTERONE CYPIONATE) 200 mg/mL injection   0    VASCEPA 1 gram cap TAKE 2 CAPSULES BY MOUTH TWICE A DAY 360 capsule 0    venlafaxine (EFFEXOR-XR) 37.5 MG 24 hr capsule Take 2 capsules (75 mg total) by mouth daily. 180 capsule 3    VICTOZA 3-PAK 0.6 mg/0.1 mL (18 mg/3 mL) injection Inject 0.3 mL (1.8 mg total) under the skin daily.  4    vit C/E/Zn/coppr/lutein/zeaxan (PRESERVISION AREDS 2 ORAL) Take 2 tablets by mouth.      zolpidem (AMBIEN) 10 mg tablet 1/2-1 tab po at bedtime prn 30 tablet 0    flash glucose sensor kit 1 each.      fluticasone propionate (FLONASE) 50 mcg/actuation nasal spray 1 spray into each nostril Two (2) times a day. 32 g 11    nitroglycerin (NITROSTAT) 0.3 MG SL tablet       pantoprazole (PROTONIX) 40 MG tablet       timolol (TIMOPTIC) 0.5 % ophthalmic solution   4     No current facility-administered medications on file prior to visit.        ALLERGIES:      Statins-hmg-coa reductase inhibitors         PAST MEDICAL HISTORY:     Past Medical History:   Diagnosis Date    Coronary artery disease     Diabetes mellitus (CMS-HCC)     Diverticulosis     Hyperlipidemia     statin intolerant    Hypertension     Opioid abuse, in remission (CMS-HCC) 05/23/2018    Opioid use disorder     on subutex , stable as of 12/19       PAST SURGICAL HISTORY:     Past Surgical History:   Procedure Laterality Date    APPENDECTOMY      arthroscopic rotator cuff repair Right 07/27/2013    FRACTURE SURGERY Right     ORIF R tibial plateau fracdture    LEFT COLECTOMY  1977    PR PRQ TRLUML CORONARY STENT W/ANGIO ONE ART/BRNCH N/A 05/03/2021    Procedure: CTO PCI;  Surgeon: Marlaine Hind, MD;  Location: [facility] CATH;  Service: Cardiology    TONSILLECTOMY         PROBLEM LIST:     Patient Active Problem List    Diagnosis Date Noted    High risk medication use 11/20/2021    Dyspnea on exertion 04/24/2021    Fatigue 12/21/2020    Gross hematuria 10/24/2020    Disorder of rotator cuff syndrome of left shoulder and allied disorder 08/29/2020    Sputum production 05/11/2020    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 (CMS-HCC) 04/16/2020    Rhinorrhea 02/22/2020    Nonintractable headache 12/23/2019    GAD (generalized anxiety disorder) 09/17/2019    Chronic nausea 09/17/2019    Insomnia 09/01/2018    Routine general medical examination at a health care facility 07/03/2018    PND (post-nasal drip) 07/03/2018  Cough 07/03/2018    Class 2 obesity 07/03/2018    Screening for colon cancer 05/25/2018    Adenomatous polyp of colon 04/11/2018    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    Eye disorder 04/11/2018    Smoker 04/11/2018    Opioid use disorder 04/10/2018    Hyperlipidemia 04/09/2018    Back pain at L4-L5 level 02/03/2018    Hypogonadism in male 04/11/2016    CAD (coronary artery disease) 04/06/2015    Sleep apnea 04/06/2015    Erectile dysfunction 02/16/2014    Essential hypertension 02/14/2014    Long-term insulin use (CMS-HCC) 02/14/2014    Type 2 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 He started smoking about 18 years ago. He has been smoking an average of .5 packs per day. He has never used smokeless tobacco. He reports current alcohol use. He reports current drug use. Drug: Marijuana.   FAMILY HISTORY:     family history includes Alcohol abuse in his father and son; Diabetes in his paternal aunt and son; Drug abuse in his son; Liver disease in his father; Mental illness in his son.       Objective:    There were no vitals taken for this visit. - There is no height or weight on file to calculate BMI.    Physical exam:    Objective:     As part of this Telephone Visit, no in-person exam was conducted.            No results found for this or any previous visit (from the past 24 hour(s)).     I attest that I, Paulino Rily, personally documented this note while acting as scribe for Arlyss Gandy, MD.      Paulino Rily, Scribe.  11/20/2021     The documentation recorded by the scribe accurately reflects the service I personally performed and the decisions made by me.    Arlyss Gandy, MD

## 2021-11-19 MED ORDER — BUPRENORPHINE HCL 8 MG SUBLINGUAL TABLET
ORAL_TABLET | 0 refills | 30 days
Start: 2021-11-19 — End: 2021-12-19

## 2021-11-19 MED ORDER — PHENTERMINE 37.5 MG TABLET
ORAL_TABLET | Freq: Every day | ORAL | 0 refills | 90 days
Start: 2021-11-19 — End: 2022-02-17

## 2021-11-20 ENCOUNTER — Institutional Professional Consult (permissible substitution): Admit: 2021-11-20 | Discharge: 2021-11-21 | Payer: PRIVATE HEALTH INSURANCE

## 2021-11-20 DIAGNOSIS — Z79899 Other long term (current) drug therapy: Principal | ICD-10-CM

## 2021-11-20 DIAGNOSIS — F17210 Nicotine dependence, cigarettes, uncomplicated: Principal | ICD-10-CM

## 2021-11-20 DIAGNOSIS — F1190 Opioid use, unspecified, uncomplicated: Principal | ICD-10-CM

## 2021-11-20 DIAGNOSIS — F17200 Nicotine dependence, unspecified, uncomplicated: Principal | ICD-10-CM

## 2021-11-20 DIAGNOSIS — Z122 Encounter for screening for malignant neoplasm of respiratory organs: Principal | ICD-10-CM

## 2021-11-20 DIAGNOSIS — E669 Obesity, unspecified: Principal | ICD-10-CM

## 2021-11-20 MED ORDER — PHENTERMINE 37.5 MG TABLET
ORAL_TABLET | Freq: Every day | ORAL | 0 refills | 90 days | Status: CN
Start: 2021-11-20 — End: 2022-02-18

## 2021-11-20 MED ORDER — BUPRENORPHINE HCL 8 MG SUBLINGUAL TABLET
ORAL_TABLET | 1 refills | 0 days | Status: CP
Start: 2021-11-20 — End: 2022-01-19

## 2021-11-20 NOTE — Unmapped (Addendum)
Previous BMI of 36.48, patient seen via telehealth today so unable to calculate BMI  Reports a goal weight of 220 lbs. Patient tolerating phentermine tablets well and reports they are helping to decrease his appetite. Of note, capsules do not suppress appetite so will stay w/tablets.  Continue phentermine tablets 37.5 mg qAM before breakfast  Advised patient to return this week for NV to check weight and BP  Of note, his cardiologist has approved phentermine use  Encouraged weight loss via healthy diet and regular exercise-rec'd start going to gym if wt plateaus  Wt Readings from Last 3 Encounters:   10/23/21 (!) 112.1 kg (247 lb 0.6 oz)   07/17/21 (!) 111.1 kg (245 lb)   06/07/21 (!) 110.9 kg (244 lb 9.6 oz)

## 2021-11-20 NOTE — Unmapped (Addendum)
The patient meets eligibility criteria including age, smoking history (20+ pack years), and, if former smoker, quit in the last 15 years, and absence of signs or symptoms of lung cancer     We conducted a shared decision-making process using a decision aid. We reviewed benefits and harms of screening, including false positives and potential need for additional diagnostic testing, the possibility of over diagnosis, and total radiation exposure.      We discussed the importance of adhering to annual LDCT screening. We also discussed the impact of comorbidities on the patient???s the ability or willingness to undergo diagnostic procedure(s) and treatment     We have discussed the importance of achieving/maintaining abstinence from cigarette smoking.    CT lung ca screening done in [facility] 5/22-pt is saw his specialist 10/24/21, but CT was not ordered.     Based on our discussion, we have decided to continue CT lung cancer screening

## 2021-11-20 NOTE — Unmapped (Signed)
Contacted pt to schedule ; pt stated that he will give [place] a call back to schedule because he is not sure of his availability at this time.

## 2021-11-20 NOTE — Unmapped (Signed)
See Opioid use disorder tab

## 2021-11-20 NOTE — Unmapped (Addendum)
Hx of opioid pill abuse, starting after prescribed opioids for knee/shoulder surgeries.  Hx rare MJ use in social situations and regular CBD oil use, so expect UDS + cannabinoid. Stable on Subutex now x several yrs, pt wanting to gradually taper down. Overall well controlled on recent (6/23) decrease dose to 1 tab qAM and 1/2 tab qPM, but having mild cravings on this dose so does not want to taper down further.   Continue subutex 8 mg (1 tab) in am and 1/2 tab in evening. Pt will watch for withdrawal sxs/cravings and urged patient to contact me if this occurs and could return to 1 tab bid or perhaps 1 tab in am and 3/4 tab in pm.We again discussed the risks of tapering down buprenorphine including relapse and overdose.   Has rx narcan nasal spray   Controlled substance contract signed 12/21/20-update next visit  Advised patient to schedule NV for UDS this week,  PDMP today shows 30 day supply of subutex, 90 day supply of phentermine and 30 day supply of ambien 10/23/21  Plan f/u in 2 mos

## 2021-11-23 ENCOUNTER — Other Ambulatory Visit: Admit: 2021-11-23 | Discharge: 2021-11-24 | Payer: PRIVATE HEALTH INSURANCE

## 2021-11-23 ENCOUNTER — Ambulatory Visit: Payer: PPO | Admitting: Dermatology

## 2021-11-23 DIAGNOSIS — F1190 Opioid use, unspecified, uncomplicated: Principal | ICD-10-CM

## 2021-11-23 DIAGNOSIS — Z79899 Other long term (current) drug therapy: Principal | ICD-10-CM

## 2021-11-23 LAB — TOXICOLOGY SCREEN, URINE
AMPHETAMINE SCREEN URINE: NEGATIVE
BARBITURATE SCREEN URINE: NEGATIVE
BENZODIAZEPINE SCREEN, URINE: NEGATIVE
BUPRENORPHINE, URINE SCREEN: POSITIVE — AB
CANNABINOID SCREEN URINE: NEGATIVE
COCAINE(METAB.)SCREEN, URINE: NEGATIVE
FENTANYL SCREEN, URINE: NEGATIVE
METHADONE SCREEN, URINE: NEGATIVE
OPIATE SCREEN URINE: NEGATIVE
OXYCODONE SCREEN URINE: NEGATIVE

## 2021-11-23 NOTE — Unmapped (Signed)
Patient came into the clinic to provide an urine sample and weight. Patient completed weight check and requested to have his weight of 265 reported to his pcp.   Patient left before we could get his blood pressure.

## 2021-11-23 NOTE — Unmapped (Signed)
Pt came in clinic to drop off urine sample, collected per protocol.

## 2021-11-23 NOTE — Unmapped (Signed)
Patient came into the clinic to provide an urine sample and weight. Patient completed weight check and requested to have his weight of 265 reported to his pcp.

## 2021-11-28 LAB — BENZODIAZEPINES, CONF, URINE
2-HYDROXY ETHYL FLURAZEPAM BY MS CONFIRM: NEGATIVE ng/mL
7-NH-CLONAZEPAM-BY GC/MS: NEGATIVE ng/mL
ALPHA-HYDROXY MIDAZOLAM BY MS CONFIRM: NEGATIVE ng/mL
ALPRAZOLAM BY MS CONFIRM: NEGATIVE ng/mL
BENZODIAZEPINES INTERPRETATION: NEGATIVE
CHLORDIAZEPOXIDE BY MS CONFIRM: NEGATIVE ng/mL
CLOBAZAM BY MS CONFIRM: NEGATIVE ng/mL
CLONAZEPAM BY MS CONFIRM: NEGATIVE ng/mL
DIAZEPAM BY MS CONFIRM: NEGATIVE ng/mL
FLUNITRAZEPAM BY MS CONFIRM: NEGATIVE ng/mL
FLURAZEPAM BY MS CONFIRM: NEGATIVE ng/mL
MIDAZOLAM BY MS CONFIRM: NEGATIVE ng/mL
N-DESMETHYLCLOBAZAM BY MS CONFIRM: NEGATIVE ng/mL
PRAZEPAM BY MS CONFIRM: NEGATIVE ng/mL
TRIAZOLAM BY MS CONFIRM: NEGATIVE ng/mL
ZOLPIDEM BY MS CONFIRM: NEGATIVE ng/mL
ZOLPIDEM PHENYL-4-CARBOXYLIC ACID BY MS CONFIRM: NEGATIVE ng/mL

## 2021-11-28 LAB — OPIATE, URINE, QUANTITATIVE
6-MONOACETYLMRPH: <5 ng/mL (ref <5)
BUPRENORPHINE: 161 ng/mL — ABNORMAL HIGH (ref <5)
CODEINE GC/MS CONF: <25 ng/mL (ref <25)
FENTANYL, URINE GC/MS: <0.5 ng/mL (ref <0.5)
HYDROCODONE GC/MS CONF: <25 ng/mL (ref <25)
HYDROMORPHONE GC/MS CONF: <25 ng/mL (ref <25)
MORPHINE GC/MS CONF: <25 ng/mL (ref <25)
NORBUPRENORPHINE: 583 ng/mL — ABNORMAL HIGH (ref <5)
NORFENTANYL, UR GC/MS: <1 ng/mL (ref <1.0)
OPIATE INTERP: POSITIVE
OXYCODONE (GC/MS): <25 ng/mL (ref <25)
OXYMORPHONE: <25 ng/mL (ref <25)

## 2021-11-30 ENCOUNTER — Ambulatory Visit: Payer: PPO | Attending: Specialist

## 2021-12-01 ENCOUNTER — Telehealth: Payer: Self-pay | Admitting: *Deleted

## 2021-12-01 NOTE — Telephone Encounter (Signed)
Left message for patient to call back to schedule follow up lung cancer screening CT scan.  

## 2021-12-01 NOTE — Unmapped (Signed)
Received faxed clinical summary from [facility]. Placed in provider folder for review.

## 2021-12-05 NOTE — Unmapped (Signed)
This patient's last AWV date: [facility] Last Medicare Wellness Visit Date: Not Found    Patient Phone AWV scheduled for 12/25/2021 @10 :00AM with Cate.    Abstraction Result Flowsheet Data    Reason for Encounter  Reason for Encounter: Outreach  Primary Reason for Outreach: [employer]: No  MyChart Message: No  Outreach Call Outcome: Scheduled Telephone

## 2021-12-06 ENCOUNTER — Ambulatory Visit: Admit: 2021-12-06 | Discharge: 2021-12-07 | Payer: PRIVATE HEALTH INSURANCE

## 2021-12-06 DIAGNOSIS — R222 Localized swelling, mass and lump, trunk: Principal | ICD-10-CM

## 2021-12-06 DIAGNOSIS — R110 Nausea: Principal | ICD-10-CM

## 2021-12-06 LAB — COMPREHENSIVE METABOLIC PANEL
ALBUMIN: 4.5 g/dL (ref 3.4–5.0)
ALKALINE PHOSPHATASE: 119 U/L — ABNORMAL HIGH (ref 46–116)
ALT (SGPT): 44 U/L (ref 10–49)
ANION GAP: 7 mmol/L (ref 5–14)
AST (SGOT): 36 U/L — ABNORMAL HIGH (ref <=34)
BILIRUBIN TOTAL: 0.6 mg/dL (ref 0.3–1.2)
BLOOD UREA NITROGEN: 20 mg/dL (ref 9–23)
BUN / CREAT RATIO: 20
CALCIUM: 10.4 mg/dL (ref 8.7–10.4)
CHLORIDE: 101 mmol/L (ref 98–107)
CO2: 31.1 mmol/L — ABNORMAL HIGH (ref 20.0–31.0)
CREATININE: 0.98 mg/dL
EGFR CKD-EPI (2021) MALE: 85 mL/min/{1.73_m2} (ref >=60)
GLUCOSE RANDOM: 172 mg/dL (ref 70–179)
POTASSIUM: 4.7 mmol/L (ref 3.4–4.8)
PROTEIN TOTAL: 8.7 g/dL — ABNORMAL HIGH (ref 5.7–8.2)
SODIUM: 139 mmol/L (ref 135–145)

## 2021-12-06 LAB — CBC W/ AUTO DIFF
BASOPHILS ABSOLUTE COUNT: 0.1 10*9/L (ref 0.0–0.1)
BASOPHILS RELATIVE PERCENT: 0.7 %
EOSINOPHILS ABSOLUTE COUNT: 0.2 10*9/L (ref 0.0–0.5)
EOSINOPHILS RELATIVE PERCENT: 1.9 %
HEMATOCRIT: 49.5 % — ABNORMAL HIGH (ref 39.0–48.0)
HEMOGLOBIN: 16.8 g/dL — ABNORMAL HIGH (ref 12.9–16.5)
LYMPHOCYTES ABSOLUTE COUNT: 2.2 10*9/L (ref 1.1–3.6)
LYMPHOCYTES RELATIVE PERCENT: 23.3 %
MEAN CORPUSCULAR HEMOGLOBIN CONC: 33.9 g/dL (ref 32.0–36.0)
MEAN CORPUSCULAR HEMOGLOBIN: 31.5 pg (ref 25.9–32.4)
MEAN CORPUSCULAR VOLUME: 92.9 fL (ref 77.6–95.7)
MEAN PLATELET VOLUME: 6.7 fL — ABNORMAL LOW (ref 6.8–10.7)
MONOCYTES ABSOLUTE COUNT: 0.8 10*9/L (ref 0.3–0.8)
MONOCYTES RELATIVE PERCENT: 8.5 %
NEUTROPHILS ABSOLUTE COUNT: 6.1 10*9/L (ref 1.8–7.8)
NEUTROPHILS RELATIVE PERCENT: 65.6 %
NUCLEATED RED BLOOD CELLS: 0 /100{WBCs} (ref <=4)
PLATELET COUNT: 355 10*9/L (ref 150–450)
RED BLOOD CELL COUNT: 5.33 10*12/L (ref 4.26–5.60)
RED CELL DISTRIBUTION WIDTH: 13.5 % (ref 12.2–15.2)
WBC ADJUSTED: 9.4 10*9/L (ref 3.6–11.2)

## 2021-12-06 LAB — SEDIMENTATION RATE: ERYTHROCYTE SEDIMENTATION RATE: 9 mm/h (ref 0–20)

## 2021-12-06 LAB — LIPASE: LIPASE: 81 U/L — ABNORMAL HIGH (ref 12–53)

## 2021-12-06 NOTE — Unmapped (Addendum)
L 3cm mass Likely fat necrosis from injectable medicines. Less likely abdominal hernia.Suspect the vague lumps pt is feeling in bilateral periumbilical area are fat necrosis from his insulin/Victoza.  -plan abdominal CT if sxs of nausea/abdominal discomfort persist

## 2021-12-06 NOTE — Unmapped (Signed)
To get an xray: [facility] on [address]. No appointment needed- walk in anytime Mon-Fri 8am-4pm

## 2021-12-06 NOTE — Unmapped (Addendum)
Long term, previously stable but with recent exacerbation x several days w/diffuse abdominal discomfort (no pain). ? caused by Victoza (rx'd by Endo for DM2) vs other GI issue.   - Patient is currently taking zofran 8 mg BID, continue this  - Checking labs as below  - Ordered XR abdomen to eval for constipation, ileus etc  - Will continue to monitor and follow-up pending lab and imaging results

## 2021-12-06 NOTE — Unmapped (Addendum)
Assessment & Plan        I personally spent 31 minutes face-to-face and non-face-to-face in the care of this patient, which includes all pre, intra, and post visit time on the date of service.            Problem List Items Addressed This Visit       Abdominal wall mass     L 3cm mass Likely fat necrosis from injectable medicines. Less likely abdominal hernia.Suspect the vague lumps pt is feeling in bilateral periumbilical area are fat necrosis from his insulin/Victoza.  -plan abdominal CT if sxs of nausea/abdominal discomfort persist         Nausea - Primary     Long term, previously stable but with recent exacerbation x several days w/diffuse abdominal discomfort (no pain). ? caused by Victoza (rx'd by Endo for DM2) vs other GI issue.   - Patient is currently taking zofran 8 mg BID, continue this  - Checking labs as below  - Ordered XR abdomen to eval for constipation, ileus etc  - Will continue to monitor and follow-up pending lab and imaging results         Relevant Orders    CBC w/ Differential    Comprehensive Metabolic Panel    Lipase Level    Sedimentation Rate    XR Abdomen 1 View (Completed)        Return if symptoms worsen or fail to improve.     Medication adherence and barriers to the treatment plan have been addressed. Opportunities to optimize healthy behaviors have been discussed. Patient / caregiver voiced understanding.      Subjective:     Bernard Skinner is a 66 y.o. male who presents for Abdominal Pain (Lumps in his abdomen, sometimes feels like they have grown)            History of Present Illness     HPI       Abdominal Pain     Additional comments: Lumps in his abdomen, sometimes feels like they have grown          Last edited by Lendell Caprice, CMA on 12/06/2021 10:59 AM.           Patient presents for CDM follow-up. Last seen by me 11/20/21 via Telemedicine. Obesity - Continue phentermine tablets 37.5 mg qAM before breakfast. Advised patient to return this week for NV to check weight and BP. Of note, his cardiologist has approved phentermine use. Encouraged weight loss via healthy diet and regular exercise-rec'd start going to gym if wt plateaus. High risk medication use - see Opioid use disorder tab. Opioid use disorder - Continue subutex 8 mg (1 tab) in am and 1/2 tab in evening. Pt will watch for withdrawal sxs/cravings and urged patient to contact me if this occurs and could return to 1 tab bid or perhaps 1 tab in am and 3/4 tab in pm.We again discussed the risks of tapering down buprenorphine including relapse and overdose. Has rx narcan nasal spray. Controlled substance contract signed 12/21/20-update next visit. Advised patient to schedule NV for UDS this week. PDMP today shows 30 day supply of subutex, 90 day supply of phentermine and 30 day supply of ambien 10/23/21. Plan f/u in 2 mos. Smoker - We have discussed the importance of achieving/maintaining abstinence from cigarette smoking. CT lung ca screening done in [facility] 5/22-pt is saw his specialist 10/24/21, but CT was not ordered. Based on our discussion, we  have decided to continue CT lung cancer screening.     11/23/21 phone call - Patient came into the clinic to provide an urine sample and weight. Patient completed weight check and requested to have his weight of 265 reported to his pcp.    Patient's chart reviewed by Care mgmt 11/24/21 and 11/30/21    12/01/21 phone call - Received faxed clinical summary from [facility]. Placed in provider folder for review.     Patient outreached to scheduled AWV, scheduled for 10 AM 12/25/21      Patient reports he has some abdominal lumps on both side of his belly near umbilicus. Patient reports he first noticed these 3-5 days ago. . Patient reports when he sits up/stands he feels the lumps more. Patient denies abdominal pain but endorses nausea and vague diffuse abdominal discomfort. Patient denies emesis. Patient reports he has had a bowel movement recently yday but that it was normal. Patient reports his bowel movement fluctuate from being hard to soft. Patient reports he has a bowel movement at least once a day and sometimes twice. Denies melena or hematochezia. No diarrhea. Notes his stools have been brown or normally colored. Patient reports he has been taking the same medications as before except for repatha which he has been taking for a month. No f/c. Patient denies dysuria or changes in his urine. Patient reports his last bowel movement was yesterday. Patient reports he had diverticulitis at age 47 and had 12 inches of his colon removed. Patient also endorses he had an appendectomy during same surgery. Patient reports his family has also had similar diverticulitis issues. Patient reports he eats many granola bars, asks if he needs to adjust this to help his GI tract. Discussed the fiber from these bars are healthy.      Pt asks me during visit how many Suboxone doses his son Holland Falling gets from my rx per month. Staff told me Holland Falling does not have his father listed to share medical info with, so informed pt that I cannot give him this info.    Hemoglobin A1C (%)   Date Value   02/07/2018 7.3     LDL Calculated (mg/dL)   Date Value   56/21/3086 107 (H)   02/03/2018 76     BP Readings from Last 3 Encounters:   12/06/21 122/80   10/23/21 146/77   07/17/21 129/81     Wt Readings from Last 3 Encounters:   12/06/21 (!) 121.1 kg (267 lb)   11/23/21 (!) 120.2 kg (265 lb)   10/23/21 (!) 112.1 kg (247 lb 0.6 oz)           Review of Systems     History obtained from the patient and chart review     Unless otherwise stated in the HPI:  CONSTITUTIONAL: no f/c/s  HEENT: Eyes: No diplopia or blurred vision. ENT: No earache, sore throat or runny nose.   CARDIOVASCULAR: No pressure, squeezing, strangling, tightness, heaviness or aching about the chest, neck, axilla or epigastrium.   RESPIRATORY: No cough, shortness of breath, PND or orthopnea.   GASTROINTESTINAL: No nausea, vomiting or diarrhea.   GENITOURINARY: No dysuria, frequency or urgency.   MUSCULOSKELETAL: As per HPI.   SKIN: No change in skin, hair or nails.   NEUROLOGIC: No paresthesias, fasciculations, seizures or weakness.   PSYCHIATRIC: No disorder of thought or mood.   ENDOCRINE: No heat or cold intolerance, polyuria or polydipsia.   HEMATOLOGICAL: No easy bruising or bleeding.  Review of History     MEDICATIONS:        Current Outpatient Medications on File Prior to Visit   Medication Sig Dispense Refill    amLODIPine (NORVASC) 10 MG tablet       aspirin (ECOTRIN) 81 MG tablet Take 1 tablet (81 mg total) by mouth daily. 30 tablet 11    BRILINTA 60 mg Tab       buprenorphine HCL (SUBUTEX) 8 mg tablet 1/2 tab SL tid 45 tablet 1    cetirizine (ZYRTEC) 10 MG tablet TAKE 1 TABLET BY MOUTH ONCE DAILY 90 tablet 3    clopidogreL (PLAVIX) 75 mg tablet Take 1 tablet (75 mg total) by mouth daily. 30 tablet 11    coenzyme Q10 100 mg capsule Take 1 capsule (100 mg total) by mouth.      diazePAM (VALIUM) 5 MG tablet Take 0.5 tablets (2.5 mg total) by mouth.      evolocumab (REPATHA SURECLICK) 140 mg/mL PnIj Inject the contents of one pen (140 mg) under the skin every fourteen (14) days. 6 mL 3    ezetimibe (ZETIA) 10 mg tablet Take 1 tablet (10 mg total) by mouth daily.      flash glucose sensor kit 1 each.      fluorouraciL (EFUDEX) 5 % cream Apply topically.      insulin ASPART (NOVOLOG FLEXPEN) 100 unit/mL (3 mL) injection pen Inject up to 8 units three times daily before meals, as directed      insulin glargine U-300 conc (TOUJEO SOLOSTAR) 300 unit/mL (1.5 mL) injection pen Toujeo SoloStar U-300 Insulin 300 unit/mL (1.5 mL) subcutaneous pen      lisinopriL-hydrochlorothiazide (PRINZIDE,ZESTORETIC) 10-12.5 mg per tablet       mupirocin (BACTROBAN) 2 % ointment Apply topically.      naloxone (NARCAN) 4 mg nasal spray One spray in either nostril once for known/suspected opioid overdose. May repeat every 2-3 minutes in alternating nostril til EMS arrives 2 each 0    nitroglycerin (NITROSTAT) 0.3 MG SL tablet       ondansetron (ZOFRAN) 8 MG tablet Take 1 tablet (8 mg total) by mouth Two (2) times a day. 180 tablet 3    pantoprazole (PROTONIX) 40 MG tablet       phentermine (ADIPEX-P) 37.5 mg tablet Take 1 tablet (37.5 mg total) by mouth daily before breakfast. 90 tablet 0    testosterone cypionate (DEPOTESTOTERONE CYPIONATE) 200 mg/mL injection   0    timolol (TIMOPTIC) 0.5 % ophthalmic solution   4    VASCEPA 1 gram cap TAKE 2 CAPSULES BY MOUTH TWICE A DAY 360 capsule 0    venlafaxine (EFFEXOR-XR) 37.5 MG 24 hr capsule Take 2 capsules (75 mg total) by mouth daily. 180 capsule 3    VICTOZA 3-PAK 0.6 mg/0.1 mL (18 mg/3 mL) injection Inject 0.3 mL (1.8 mg total) under the skin daily.  4    vit C/E/Zn/coppr/lutein/zeaxan (PRESERVISION AREDS 2 ORAL) Take 2 tablets by mouth.      zolpidem (AMBIEN) 10 mg tablet 1/2-1 tab po at bedtime prn 30 tablet 0    fluticasone propionate (FLONASE) 50 mcg/actuation nasal spray 1 spray into each nostril Two (2) times a day. 32 g 11    metFORMIN (GLUCOPHAGE) 1000 MG tablet Take 1 tablet (1,000 mg total) by mouth in the morning and 1 tablet (1,000 mg total) in the evening. Take with meals. 60 tablet 11     No current facility-administered medications on file prior to  visit.        ALLERGIES:      Statins-hmg-coa reductase inhibitors         PAST MEDICAL HISTORY:     Past Medical History:   Diagnosis Date    Coronary artery disease     Diabetes mellitus (CMS-HCC)     Diverticulosis     Hyperlipidemia     statin intolerant    Hypertension     Opioid abuse, in remission (CMS-HCC) 05/23/2018    Opioid use disorder     on subutex , stable as of 12/19       PAST SURGICAL HISTORY:     Past Surgical History:   Procedure Laterality Date    APPENDECTOMY      arthroscopic rotator cuff repair Right 07/27/2013    FRACTURE SURGERY Right     ORIF R tibial plateau fracdture    LEFT COLECTOMY  1977    PR PRQ TRLUML CORONARY STENT W/ANGIO ONE ART/BRNCH N/A 05/03/2021    Procedure: CTO PCI;  Surgeon: Marlaine Hind, MD;  Location: [facility] 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 (CMS-HCC) 04/16/2020    Rhinorrhea 02/22/2020    Nonintractable headache 12/23/2019    GAD (generalized anxiety disorder) 09/17/2019    Nausea 09/17/2019    Insomnia 09/01/2018    Routine general medical examination at a health care facility 07/03/2018    PND (post-nasal drip) 07/03/2018    Cough 07/03/2018    Class 2 obesity 07/03/2018    Screening for colon cancer 05/25/2018    Adenomatous polyp of colon 04/11/2018    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    Eye disorder 04/11/2018    Smoker 04/11/2018    Opioid use disorder 04/10/2018    Hyperlipidemia 04/09/2018    Back pain at L4-L5 level 02/03/2018    Hypogonadism in male 04/11/2016    CAD (coronary artery disease) 04/06/2015    Sleep apnea 04/06/2015    Erectile dysfunction 02/16/2014    Essential hypertension 02/14/2014    Long-term insulin use (CMS-HCC) 02/14/2014    Type 2 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 He started smoking about 18 years ago. He has been smoking an average of .5 packs per day. He has never used smokeless tobacco. He reports current alcohol use. He reports current drug use. Drug: Marijuana.   FAMILY HISTORY:     family history includes Alcohol abuse in his father and son; Diabetes in his paternal aunt and son; Drug abuse in his son; Liver disease in his father; Mental illness in his son.       Objective:  BP 122/80  - Pulse 74  - Temp 36.5 ??C (97.7 ??F) (Temporal)  - Wt (!) 121.1 kg (267 lb)  - BMI 39.43 kg/m??  - Body mass index is 39.43 kg/m??.    Physical exam:    General:  Well appearing, well nourished in no distress.   Skin: no obvious rash or  prominent lesions    Lymphatic: no cervical LAD. No TM  Cardiovascular:  rrr no mrg, no peripheral edema  Eyes:  conjunctiva clear, sclera non-icteric   Ears/nose/throat: no obvious external deformities  Respiratory: CTAB, breathing comfortably  Gastrointestinal: soft NT, mildly distended. No HSM. Negative Murphy's sign. There is a approximately 3 cm, non-tender, shallow, sub-cutaneous mass several centimeters to the left of the umbilicus, overlying ecchymosis c/w injection site of diabetes medicines, no warmth or fluctuance. Similar ecchymosis c/w injection site on R abdomen. Does not enlarge with valsalva. Normal active bowel sounds.   Musculoskeletal:  Normal gait and station.  Neurologic: moves extremities symmetrically, cranial nerves grossly intact  Hematologic: no obvious ecchymosis     Psychiatric:  Oriented X3, intact judgement and insight, normal mood and affect.          No results found for this or any previous visit (from the past 24 hour(s)).         I attest that I, Paulino Rily, personally documented this note while acting as scribe for Arlyss Gandy, MD.      Paulino Rily, Scribe.  12/06/2021     The documentation recorded by the scribe accurately reflects the service I personally performed and the decisions made by me.    Arlyss Gandy, MD

## 2021-12-11 NOTE — Unmapped (Addendum)
Thank you for completing your Medicare Annual Wellness Visit today. If you have any questions about your Medicare Annual Wellness Visit please call our Care Manager, Donata Clay, RN, BSN at [employer] at [phone].  Please see below your updated Personalized Prevention Plan which details the status of your preventative services, agreed upon goals to improve your fitness and health, and educational materials on health topics specific to you.     **Please consider bringing a copy of your Living Will and Healthcare Power of Attorney to the office at next visit to be scanned into your chart. **       Here is your personalized prevention plan based on your Annual Wellness Visit today.    Medicare Screening & Prevention Guidelines Recommendations Dates Completed HM Status and Next Due Follow-Up   AAA Ultrasound Once in men age 40 to 30 who have ever smoked or currently smoke. AAA screening date: 07/01/2020 Health Maintenance Summary    -      AAA Screening  Completed    07/01/2020  Ultrasound AAA Screening             Up to date   Colorectal Cancer Screening Patients 50 to 75: stool cards annually OR colonoscopy every 10 years (or more frequently if high risk) OR FIT-DNA every 3 years.  Colonoscopy date: 10/05/2013   Health Maintenance Summary     Colonoscopy every 10 years: next due 10/06/2023      Up to date   DEXA Bone Density Measurement Patients age 71-95 to have a Dexa every 5 years in postmenopausal women and high risk males. Males will defer to PCP. DEXA date: Not Found Health Maintenance Summary     This patient has no relevant Health Maintenance data.       Not applicable   Diabetes Eye Exam Annually if Diabetic Eye Exam date: 07/03/2021 Health Maintenance Summary    -      Retinal Eye Exam (Yearly) Next due on 07/03/2022    07/03/2021  HM Diabetic Eye Exam component of HM DIABETES EYE EXAM    10/26/2020  HM Diabetic Eye Exam component of HM DIABETES EYE EXAM    04/11/2020  HM Diabetic Eye Exam component of HM DIABETES EYE EXAM    10/12/2019  HM Diabetic Eye Exam component of HM DIABETES EYE EXAM    10/13/2018  HM Diabetic Eye Exam component of HM DIABETES EYE EXAM    Only the first 5 history entries have been loaded, but more history   exists.           Up to date   Diabetes Foot Exam Annually if Diabetic. Most Recent Foot Exam Date: 04/19/2021 Health Maintenance Summary    -      Foot Exam (Yearly) Next due on 04/19/2022    04/19/2021  HM DIABETES FOOT EXAM    04/06/2020  HM DIABETES FOOT EXAM    09/17/2019  HM DIABETES FOOT EXAM    09/17/2019  SmartData: WORKFLOW - DIABETES - DIABETIC FOOT EXAM PERFORMED      05/23/2018  SmartData: WORKFLOW - DIABETES - DIABETIC FOOT EXAM PERFORMED             Up to date   Diabetes urine Albumin/Creatinine Ratio Annually if Diabetic UACR Date: 04/17/2021 Health Maintenance Summary    -      Urine Albumin/Creatinine Ratio (Yearly) Next due on 04/17/2022    04/17/2021  Outside Procedure: CHG URINE ALBUMIN QUANTITATIVE    08/29/2020  Albumin/Creatinine Ratio component of Albumin/creatinine   urine ratio    09/01/2019  Albumin/Creatinine Ratio component of Microalbumin, Urine   Random    08/19/2018  Outside Procedure: CHG URINE ALBUMIN QUANTITATIVE    11/04/2017  Albumin/Creatinine Ratio component of Microalbumin, Urine   Random             Up to date   Diabetes Hemoglobin A1c Every 3 or 6 months depending on last result Last Hemoglobin A1c Date: 10/26/2021 Health Maintenance Summary    -      Hemoglobin A1c (Every 6 Months) Next due on 04/27/2022    10/26/2021  Outside Procedure: CHG GLYCOSYLATED HEMOGLOBIN TEST    04/17/2021  Outside Procedure: CHG GLYCOSYLATED HEMOGLOBIN TEST    10/05/2020  Outside Procedure: CHG GLYCOSYLATED HEMOGLOBIN TEST    04/11/2020  Outside Procedure: CHG GLYCOSYLATED HEMOGLOBIN TEST    05/05/2019  Outside Procedure: CHG GLYCOSYLATED HEMOGLOBIN TEST    Only the first 5 history entries have been loaded, but more history   exists.           Up to date   Heart Disease Screening (fasting lipid panel) Minimum of every 5 years, patients age 35-75,  if no apparent signs or symptoms of heart disease. LDL date: 07/01/2020  Total choleseterol date: 07/01/2020  HDL date: 07/01/2020  Triglycerides date: 07/01/2020 Health Maintenance Summary    -      Lipid Screening (Every 5 Years) Next due on 10/05/2025    10/05/2020  Outside Procedure: CHG LIPID PANEL    07/01/2020  [facility]: [facility] TOTAL CHOLESTEROL AND HDL COMPLETE    07/01/2020  Lipid Panel    05/05/2019  Outside Procedure: CHG LIPID PANEL    02/03/2018  Lipid Panel             Defer to PCP   Hepatitis C Screening A one-time screening for HCV infection for adults age 62 - 102 years old. HCV screening date: 04/06/2015 Health Maintenance Summary    -      Hepatitis C Screen  Completed    04/06/2015  Hepatitis C Ab component of HM HEPATITIS C SCREENING             Complete   Covid-19 Vaccine For persons 5 and older @[id number] @ Health Maintenance   Topic Date Due    COVID-19 Vaccine (5 - Pfizer risk series) 12/19/2020        Provided information on how to obtain at pharmacy   Tdap Every 10 years (will not be covered by Medicare) DTap/Tdap/TD vaccination: 04/06/2015 Health Maintenance Summary    -      DTaP/Tdap/Td Vaccines (2 - Td or Tdap) Next due on 04/05/2025    04/06/2015  Imm Admin: TdaP           Up to date   Influenza Vaccine Annually  Influenza Vaccination: 05/18/2021   Health Maintenance Summary    -      Influenza Vaccine (1) Next due on 01/05/2022    05/18/2021  Imm Admin: INFLUENZA QUAD ADJUVANTED 33YR UP(FLUAD)    02/22/2020  Imm Admin: INFLUENZA INJ MDCK PF, QUAD,(FLUCELVAX)(38MO AND UP   EGG FREE)    03/26/2019  Imm Admin: Influenza Vaccine Quad (IIV4 PF) 51mo+ injectable    02/03/2018  Imm Admin: Influenza Vaccine Quad (IIV4 PF) 3mo+ injectable    04/05/2016  Imm Admin: Influenza Vaccine Quad (IIV4 PF) 47mo+ injectable    Only the first 5 history entries have been loaded, but more history  exists. Up to date   Pneumococcal Vaccine For people ?65, or with an underlying condition Pneumonia vaccination: 12/21/2020   Health Maintenance Summary    -      Pneumococcal Vaccine 65+ (Series Information) Completed    12/21/2020  Imm Admin: PNEUMOCOCCAL POLYSACCHARIDE 23    09/17/2019  Imm Admin: Pneumococcal Conjugate 13-Valent    02/05/2002  Imm Admin: PNEUMOCOCCAL POLYSACCHARIDE 23             Complete   Zoster Vaccine Healthy adults 50 years and older receive 2 doses of recombinant zoster vaccine two to six months apart (may not be covered by Medicare).  Zoster vaccination: 11/24/2018 Health Maintenance Summary    -      Zoster Vaccines (Series Information) Completed    11/24/2018  Imm Admin: SHINGRIX-ZOSTER VACCINE (HZV),   RECOMBINANT,SUB-UNIT,ADJUVANTED IM    07/03/2018  Imm Admin: SHINGRIX-ZOSTER VACCINE (HZV),   RECOMBINANT,SUB-UNIT,ADJUVANTED IM    10/26/2010  Imm Admin: ZOSTAVAX - ZOSTER VACCINE, LIVE, SQ           Complete    Patient Education        Well Visit, Over 65: Care Instructions  Well visits can help you stay healthy. Your doctor has checked your overall health and may have suggested ways to take good care of yourself. Your doctor also may have recommended tests. You can help prevent illness with healthy eating, good sleep, vaccinations, regular exercise, and other steps.    Get the tests that you and your doctor decide on. Depending on your age and risks, examples might include hearing tests as well as screening for colon, breast, and lung cancer. Screening helps find diseases before any symptoms appear.   Eat healthy foods. Choose fruits, vegetables, whole grains, lean protein, and low-fat dairy foods. Limit saturated fat, and reduce salt.     Limit alcohol. Men should have no more than 2 drinks a day. Women should have no more than 1. For some people, no alcohol is the best choice.   Exercise. It can help prevent falls. Get at least 30 minutes of exercise on most days of the week. Walking, yoga, and tai chi can be good choices.     Reach and stay at your healthy weight. This will lower your risk for many health problems.   Take care of your mental health. Try to stay connected with friends, family, and community, and find ways to manage stress.     If you're feeling depressed or hopeless, talk to someone. A counselor can help. If you don't have a counselor, talk to your doctor.   Talk to your doctor if you think you may have a problem with alcohol or drug use. This includes prescription medicines and illegal drugs.     Avoid tobacco and nicotine: Don't smoke, vape, or chew. If you need help quitting, talk to your doctor.   Practice safer sex. Getting tested, using condoms or dental dams, and limiting sex partners can help prevent STIs.     Make an advance directive. This is a legal way to tell your family and doctor what you want to happen at the end of your life or when you can't speak for yourself.   Prevent problems where you can. Protect your skin from too much sun, wash your hands, brush your teeth twice a day, and wear a seat belt in the car.   Where can you learn more?  Go to [facility] at [email or web address]  in the Menu. Enter [id number] in the search box to learn more about Well Visit, Over 65: Care Instructions.  Current as of: March 20, 2021               Content Version: 13.7  ?? 2006-2023 [employer].   Care instructions adapted under license by [facility]. If you have questions about a medical condition or this instruction, always ask your healthcare professional. [employer] Inc[employer] disclaims any warranty or liability for your use of this information.         Patient Education        Chronic Pain: Care Instructions  Your Care Instructions     Chronic pain is pain that lasts a long time (months or even years) and may or may not have a clear cause. It is different from acute pain, which usually does have a clear cause--like an injury or illness--and gets better over time. Chronic pain:  Lasts over time but may vary from day to day.  Does not go away despite efforts to end it.  May disrupt your sleep and lead to fatigue.  May cause depression or anxiety.  May make your muscles tense, causing more pain.  Can disrupt your work, hobbies, home life, and relationships with friends and family.  Chronic pain is a very real condition. It is not just in your head. Treatment can help and usually includes several methods used together, such as medicines, physical therapy, exercise, and other treatments. Learning how to relax and changing negative thought patterns can also help you cope.  Chronic pain is complex. Taking an active role in your treatment will help you better manage your pain. Tell your doctor if you have trouble dealing with your pain. You may have to try several things before you find what works best for you.  Follow-up care is a key part of your treatment and safety. Be sure to make and go to all appointments, and call your doctor if you are having problems. It's also a good idea to know your test results and keep a list of the medicines you take.  How can you care for yourself at home?  Pace yourself. Break up large jobs into smaller tasks. Save harder tasks for days when you have less pain, or go back and forth between hard tasks and easier ones. Take rest breaks.  Relax, and reduce stress. Relaxation techniques such as deep breathing or meditation can help.  Keep moving. Gentle, daily exercise can help reduce pain over the long run. Try low- or no-impact exercises such as walking, swimming, and stationary biking. Do stretches to stay flexible.  Try heat, cold packs, and massage.  Get enough sleep. Chronic pain can make you tired and drain your energy. Talk with your doctor if you have trouble sleeping because of pain.  Think positive. Your thoughts can affect your pain level. Do things that you enjoy to distract yourself when you have pain instead of focusing on the pain. See a movie, read a book, listen to music, or spend time with a friend.  If you think you are depressed, talk to your doctor about treatment.  Keep a daily pain diary. Record how your moods, thoughts, sleep patterns, activities, and medicine affect your pain. You may find that your pain is worse during or after certain activities or when you are feeling a certain emotion. Having a record of your pain can help you and your doctor find the best ways to treat your pain.  Take pain medicines exactly as directed.  If the doctor gave you a prescription medicine for pain, take it as prescribed.  If you are not taking a prescription pain medicine, ask your doctor if you can take an over-the-counter medicine.  Reducing constipation caused by pain medicine  Talk to your doctor about a laxative. If a laxative doesn't work, your doctor may suggest a prescription medicine.  Include fruits, vegetables, beans, and whole grains in your diet each day. These foods are high in fiber.  If your doctor recommends it, get more exercise. Walking is a good choice. Bit by bit, increase the amount you walk every day. Try for at least 30 minutes on most days of the week.  Schedule time each day for a bowel movement. A daily routine may help. Take your time and do not strain when having a bowel movement.  When should you call for help?   Call your doctor now or seek immediate medical care if:    Your pain gets worse or is out of control.     You feel down or blue, or you do not enjoy things like you once did. You may be depressed, which is common in people with chronic pain. Depression can be treated.     You have vomiting or cramps for more than 2 hours.   Watch closely for changes in your health, and be sure to contact your doctor if:    You cannot sleep because of pain.     You are very worried or anxious about your pain.     You have trouble taking your pain medicine.     You have any concerns about your pain medicine.     You have trouble with bowel movements, such as:  No bowel movement in 3 days.  Blood in the anal area, in your stool, or on the toilet paper.  Diarrhea for more than 24 hours.   Where can you learn more?  Go to [facility] at [email or web address] in the Menu. Enter N004 in the search box to learn more about Chronic Pain: Care Instructions.  Current as of: March 15, 2021               Content Version: 13.7  ?? 2006-2023 [employer].   Care instructions adapted under license by [facility]. If you have questions about a medical condition or this instruction, always ask your healthcare professional. [employer] disclaims any warranty or liability for your use of this information.         Patient Education        DASH Diet: Care Instructions  Your Care Instructions     The DASH diet is an eating plan that can help lower your blood pressure. DASH stands for Dietary Approaches to Stop Hypertension. Hypertension is high blood pressure.  The DASH diet focuses on eating foods that are high in calcium, potassium, and magnesium. These nutrients can lower blood pressure. The foods that are highest in these nutrients are fruits, vegetables, low-fat dairy products, nuts, seeds, and legumes. But taking calcium, potassium, and magnesium supplements instead of eating foods that are high in those nutrients does not have the same effect. The DASH diet also includes whole grains, fish, and poultry.  The DASH diet is one of several lifestyle changes your doctor may recommend to lower your high blood pressure. Your doctor may also want you to decrease the amount of sodium in your diet. Lowering sodium  while following the DASH diet can lower blood pressure even further than just the DASH diet alone.  Follow-up care is a key part of your treatment and safety. Be sure to make and go to all appointments, and call your doctor if you are having problems. It's also a good idea to know your test results and keep a list of the medicines you take.  How can you care for yourself at home?  Following the DASH diet  Eat 4 to 5 servings of fruit each day. A serving is 1 medium-sized piece of fruit, ?? cup chopped or canned fruit, 1/4 cup dried fruit, or 4 ounces (?? cup) of fruit juice. Choose fruit more often than fruit juice.  Eat 4 to 5 servings of vegetables each day. A serving is 1 cup of lettuce or raw leafy vegetables, ?? cup of chopped or cooked vegetables, or 4 ounces (?? cup) of vegetable juice. Choose vegetables more often than vegetable juice.  Get 2 to 3 servings of low-fat and fat-free dairy each day. A serving is 8 ounces of milk, 1 cup of yogurt, or 1 ?? ounces of cheese.  Eat 6 to 8 servings of grains each day. A serving is 1 slice of bread, 1 ounce of dry cereal, or ?? cup of cooked rice, pasta, or cooked cereal. Try to choose whole-grain products as much as possible.  Limit lean meat, poultry, and fish to 2 servings each day. A serving is 3 ounces, about the size of a deck of cards.  Eat 4 to 5 servings of nuts, seeds, and legumes (cooked dried beans, lentils, and split peas) each week. A serving is 1/3 cup of nuts, 2 tablespoons of seeds, or ?? cup of cooked beans or peas.  Limit fats and oils to 2 to 3 servings each day. A serving is 1 teaspoon of vegetable oil or 2 tablespoons of salad dressing.  Limit sweets and added sugars to 5 servings or less a week. A serving is 1 tablespoon jelly or jam, ?? cup sorbet, or 1 cup of lemonade.  Eat less than 2,300 milligrams (mg) of sodium a day. If you limit your sodium to 1,500 mg a day, you can lower your blood pressure even more.  Be aware that all of these are the suggested number of servings for people who eat 1,800 to 2,000 calories a day. Your recommended number of servings may be different if you need more or fewer calories.  Tips for success  Start small. Do not try to make dramatic changes to your diet all at once. You might feel that you are missing out on your favorite foods and then be more likely to not follow the plan. Make small changes, and stick with them. Once those changes become habit, add a few more changes.  Try some of the following:  Make it a goal to eat a fruit or vegetable at every meal and at snacks. This will make it easy to get the recommended amount of fruits and vegetables each day.  Try yogurt topped with fruit and nuts for a snack or healthy dessert.  Add lettuce, tomato, cucumber, and onion to sandwiches.  Combine a ready-made pizza crust with low-fat mozzarella cheese and lots of vegetable toppings. Try using tomatoes, squash, spinach, broccoli, carrots, cauliflower, and onions.  Have a variety of cut-up vegetables with a low-fat dip as an appetizer instead of chips and dip.  Sprinkle sunflower seeds or chopped almonds over salads. Or try adding  chopped walnuts or almonds to cooked vegetables.  Try some vegetarian meals using beans and peas. Add garbanzo or kidney beans to salads. Make burritos and tacos with mashed pinto beans or black beans.  Where can you learn more?  Go to [facility] at [email or web address]/[email or web address] in the Menu. Enter [phone] in the search box to learn more about DASH Diet: Care Instructions.  Current as of: July 05, 2021               Content Version: 13.7  ?? 2006-2023 [employer], [employer].   Care instructions adapted under license by [facility]. If you have questions about a medical condition or this instruction, always ask your healthcare professional. [employer] disclaims any warranty or liability for your use of this information.

## 2021-12-21 ENCOUNTER — Other Ambulatory Visit: Payer: Self-pay | Admitting: Specialist

## 2021-12-21 NOTE — H&P (Addendum)
PREOPERATIVE H&P  Chief Complaint: G56.02 Carpal tunnel syndrome, left upper limb  HPI: Joshua SARA Sr. is a 66 y.o. male who presents for preoperative history and physical with a diagnosis of G56.02 Carpal tunnel syndrome, left upper limb. Symptoms are rated as moderate to severe, and have been worsening.  This is significantly impairing activities of daily living.  Nerve conductions done 2 years ago showed advanced disease but surgery was postponed due to the [place].  He has elected for surgical management.   Past Medical History:  Diagnosis Date   A-fib ([place]) 1995   Actinic keratosis 06/12/2013   Anterior chest.   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 WD SCC, acantholytic (adenoid) variant.   Squamous cell carcinoma of skin 07/03/2018   Left posterior midline scalp. WD SCC, acantholytic (adenoid) variant.   Type 2 diabetes mellitus without complication [facility])    Past Surgical History:  Procedure Laterality Date   AQUEOUS SHUNT Left 07/16/2016   Procedure: AQUEOUS SHUNT  left  diabetic;  Surgeon: Ronnell Freshwater, MD;  Location: [facility];  Service: Ophthalmology;  Laterality: Left;  ahmed tube shunt diabetic - insulin sleep apnea   CARDIAC CATHETERIZATION  2012   diverticulosis     KNEE ARTHROSCOPY     LEFT HEART CATH AND CORONARY ANGIOGRAPHY N/A 03/24/2021   Procedure: LEFT HEART CATH AND CORONARY ANGIOGRAPHY with intervention;  Surgeon: Dionisio David, MD;  Location: [facility];  Service: Cardiovascular;  Laterality: N/A;   ROTATOR CUFF REPAIR Right 2015   Social History   Socioeconomic History   Marital  status: Married    Spouse name: Bonnita Nasuti   Number of children: 4   Years of education: Not on file   Highest education level: Not on file  Occupational History   Not on file  Tobacco Use   Smoking status: Every Day    Packs/day: 0.75    Years: 30.00    Total pack years: 22.50    Types: Cigarettes   Smokeless tobacco: Never   Tobacco comments:    Working down number of cigarettes a day and has started Chantix  Vaping Use   Vaping Use: Never used  Substance and Sexual Activity   Alcohol use: Yes    Comment: Daily: 4-6 oz. Vodka   Drug use: No   Sexual activity: Not on file  Other Topics Concern   Not on file  Social History Narrative   Lives at home with wife: son & g-friend and grandkid live with them also    Social Determinants of Health   Financial Resource Strain: Not on file  Food Insecurity: Not on file  Transportation Needs: Not on file  Physical Activity: Not on file  Stress: Not on file  Social Connections: Not on file   Family History  Problem Relation Age of Onset   Alcohol abuse Father    Heart disease Father    Heart disease Mother    Allergies  Allergen Reactions   Crestor [Rosuvastatin]     myalgia   Lotensin [Benazepril] Other (See Comments)    GI upset  Statins Nausea Only    Nausea w/ rosuvastatin and simvastatin   Zocor [Simvastatin]     myalgia   Prior to Admission medications   Medication Sig Start Date End Date Taking? Authorizing Provider  amLODipine (NORVASC) 10 MG tablet Take 10 mg by mouth daily. 05/14/19   [provider]  aspirin EC 81 MG tablet Take 81 mg by mouth at bedtime.    [provider]  B-D 3CC LUER-LOK SYR 21GX1" 21G X 1" 3 ML MISC USE TO INJECT 1 ML OF TESTOSTERONE INTO MUSCLE 11/12/16   [provider]  BD HYPODERMIC NEEDLE 18G X 1" MISC USE AS DIRECTED FOR WEEKLY INJECTIONS 04/09/17   [provider]  BD ULTRA-FINE PEN NEEDLES 29G X 12.7MM MISC USE 2 (TWO) TIMES DAILY. 03/11/17   [provider]  buprenorphine (SUBUTEX) 8 MG SUBL SL tablet Place 4-8 mg under the tongue 2 (two) times daily as needed (pain). Take 8 mg by mouth in the morning and 4 mg in the evening as needed for pain 08/27/19   [provider]  cetirizine (ZYRTEC) 10 MG tablet Take 10 mg by mouth daily. 11/03/20   [provider]  clopidogrel (PLAVIX) 75 MG tablet Take 75 mg by mouth daily. 05/12/21   [provider]  Coenzyme Q10 (CO Q-10) 100 MG CAPS Take 100 mg by mouth in the morning and at bedtime.    [provider]  Continuous Blood Gluc Receiver (FREESTYLE LIBRE READER) DEVI USE 1 EACH ONCE DAILY. 10/09/16   [provider]  Continuous Blood Gluc Sensor (FREESTYLE LIBRE SENSOR SYSTEM) MISC USE 3 EACH EVERY 10 (TEN) DAYS. 10/09/16   [provider]  diazepam (VALIUM) 5 MG tablet Take 2.5 mg by mouth daily as needed for anxiety.    [provider]  Evolocumab (REPATHA SURECLICK) 885 MG/ML SOAJ Inject into the skin. 04/13/21   [provider]  ezetimibe (ZETIA) 10 MG tablet TAKE ONE TABLET EVERY DAY 06/07/20   Johnson, Megan P, DO  fluorouracil (EFUDEX) 5 % cream Apply topically 2 (two) times daily. Apply for 7 days to scalp (start June 4) 09/07/21   Ralene Bathe, MD  FLUoxetine (PROZAC) 20 MG tablet Take 40 mg by mouth daily. Patient not taking: Reported on 05/30/2021 02/22/21   [provider]  gabapentin (NEURONTIN) 300 MG capsule TAKE 1 CAPSULE 3 TIMES DAILY Patient not taking: Reported on 03/21/2021 12/11/19   Park Liter P, DO  gentamicin cream (GARAMYCIN) 0.1 % Apply 1 application topically 2 (two) times daily. Patient taking differently: Apply 1 application. topically daily as needed (irritation). 06/30/19   Edrick Kins, DPM  hydrocortisone 2.5 % cream Apply 1 application topically daily as needed (rash).  Patient not taking: Reported on 03/24/2021 05/07/19   [provider]  ibuprofen (ADVIL) 800 MG tablet Take 1  tablet (800 mg total) by mouth 3 (three) times daily. Patient not taking: Reported on 05/30/2021 09/01/19   Park Liter P, DO  insulin aspart (NOVOLOG) 100 UNIT/ML FlexPen Inject 2-6 Units into the skin 3 (three) times daily as needed (blood sugar of 250 or above). 02/10/18   [provider]  insulin lispro (HUMALOG KWIKPEN) 100 UNIT/ML KiwkPen Inject 0.4 mLs (40 Units total) into the skin 3 (three) times daily. Use 10-40u with meals Patient not taking: Reported on 05/30/2021 11/24/14   Guadalupe Maple, MD  JARDIANCE 25 MG TABS tablet Take 25 mg by mouth daily. 02/01/21   [provider]  lisinopril-hydrochlorothiazide (ZESTORETIC) 20-25 MG tablet Take 1 tablet by mouth daily. 05/26/19   [provider]  meloxicam (MOBIC) 7.5 MG tablet Take 7.5 mg by mouth in the morning and at bedtime. Patient not taking: Reported on 05/30/2021 02/21/21   [provider]  metFORMIN (GLUCOPHAGE) 1000 MG tablet Take 1,000 mg by mouth 2 (two) times daily with a meal.    [provider]  Multiple Vitamins-Minerals (PRESERVISION AREDS 2 PO) Take 2 tablets by mouth in the morning and at bedtime.    [provider]  mupirocin ointment (BACTROBAN) 2 % Apply 1 application. topically daily. Apply to any cuts, scrapes, wounds at body daily until healed. 09/07/21   Ralene Bathe, MD  [facility] 4 MG/0.1ML LIQD nasal spray kit Place 0.4 mg into the nose once. 02/22/21   [provider]  ondansetron (ZOFRAN) 4 MG tablet Take 1 tablet (4 mg total) by mouth 2 (two) times daily. Patient not taking: Reported on 03/21/2021 09/01/19   Park Liter P, DO  ondansetron (ZOFRAN) 8 MG tablet Take 8 mg by mouth 2 (two) times daily. 12/21/20   [provider]  pantoprazole (PROTONIX) 40 MG tablet TAKE ONE TABLET EVERY DAY 06/28/20   Park Liter P, DO  sildenafil (REVATIO) 20 MG tablet Take 1-5 tablets (20-100 mg total) by mouth as needed. 08/06/18   Guadalupe Maple, MD   testosterone cypionate (DEPOTESTOSTERONE CYPIONATE) 200 MG/ML injection Inject 200 mg into the muscle every 14 (fourteen) days.  12/07/14   [provider]  timolol (TIMOPTIC) 0.5 % ophthalmic solution Place 1 drop into the left eye 2 (two) times daily.  03/23/16   [provider]  TOUJEO SOLOSTAR 300 UNIT/ML Solostar Pen Inject 42 Units into the skin at bedtime. 02/01/21   [provider]  VASCEPA 1 g capsule Take 2 g by mouth 2 (two) times daily. 02/10/19   [provider]  venlafaxine XR (EFFEXOR-XR) 37.5 MG 24 hr capsule Take 1 capsule (37.5 mg total) by mouth daily. 09/01/19   Johnson, Megan P, DO  VICTOZA 18 MG/3ML SOPN Inject 1.8 mg into the skin daily.  07/18/18   [provider]  zolpidem (AMBIEN) 10 MG tablet Take 10 mg by mouth at bedtime as needed for sleep.     [provider]     Positive ROS: All other systems have been reviewed and were otherwise negative with the exception of those mentioned in the HPI and as above.  Physical Exam: General: Alert, no acute distress Cardiovascular: No pedal edema. Heart is regular and without murmur.  Respiratory: No cyanosis, no use of accessory musculature. Lungs are clear. GI: No organomegaly, abdomen is soft and non-tender Skin: No lesions in the area of chief complaint Neurologic: Sensation intact distally Psychiatric: Patient is competent for consent with normal mood and affect Lymphatic: No axillary or cervical lymphadenopathy  MUSCULOSKELETAL: Left hand shows thenar wasting.  Pinch is weak.  Gross sensation is decreased.  By median nerve compression is positive.  Wrist motion is good.  Grip is good.  The skin is intact.  Assessment: G56.02 Carpal tunnel syndrome, left upper limb  Plan: Plan for Procedure(s): Left CARPAL TUNNEL RELEASE  The risks benefits and alternatives were discussed with the patient including but not limited to the risks of nonoperative treatment, versus  surgical intervention including infection, bleeding, nerve injury,  blood clots, cardiopulmonary complications, morbidity, mortality, among others, and they were willing to proceed.   Isidore Moos  Sabra Heck, [place]   12/21/2021 5:31 PM

## 2021-12-22 NOTE — Unmapped (Signed)
Please see media manager for scanned office note from [facility]

## 2021-12-25 ENCOUNTER — Institutional Professional Consult (permissible substitution): Admit: 2021-12-25 | Discharge: 2021-12-26 | Payer: PRIVATE HEALTH INSURANCE

## 2021-12-25 DIAGNOSIS — Z0000 Encounter for general adult medical examination without abnormal findings: Principal | ICD-10-CM

## 2021-12-25 NOTE — Unmapped (Signed)
ADVANCE CARE PLANNING NOTE    Discussion Date:  December 25, 2021    Patient has decisional capacity:  Yes    Patient has selected a Health Care Decision-Maker if loses capacity: Yes    Health Care Decision Maker as of 12/25/2021    HCDM (patient stated preference): Beryle Flock - Spouse - [phone]    Discussion Participants:  Patient and care manager, Rudene Christians, RN     Communication of Medical Status/Prognosis:   Patient states current health is very good, patient follows up with PCP and specialists regularly.     Communication of Treatment Goals/Options:   Patient has a Research scientist (physical sciences), will consider bringing documents for scanning at next follow up visit.     Treatment Decisions:   12/25/2021    Arlyss Gandy, MD was present and immediately available in office suite.    The patient reports they have a Healthcare power of attorney but have not brought a copy to the office.  Have discussed the importance of having this included in the medical record. .      Case Manager facilitated discussion with patient and no visitors about advance care planning and documentation including Healthcare power of attorney . The patient voluntarily agreed to bring to office Healthcare power of attorney.   Note: forms that require notarization require two witnessess that are non-family members nor health care workers.               I spent 15 minutes providing voluntary advance care planning services for this patient.

## 2021-12-25 NOTE — Unmapped (Signed)
This auto-generated note displays some results identified during the AWV Assessments. For full results, please see the Flowsheet Links under the Additional Documentation section of this encounter in Chart Review.          The patient reports they are currently: at home. I spent 40 minutes on the phone with the patient on the date of service. I spent an additional 10 minutes on pre- and post-visit activities on the date of service.     The patient was physically located in [place] or a state in which I am permitted to provide care. The patient and/or parent/guardian understood that s/he may incur co-pays and cost sharing, and agreed to the telemedicine visit. The visit was reasonable and appropriate under the circumstances given the patient's presentation at the time.    The patient and/or parent/guardian has been advised of the potential risks and limitations of this mode of treatment (including, but not limited to, the absence of in-person examination) and has agreed to be treated using telemedicine. The patient's/patient's family's questions regarding telemedicine have been answered.     If the visit was completed in an ambulatory setting, the patient and/or parent/guardian has also been advised to contact their provider???s office for worsening conditions, and seek emergency medical treatment and/or call 911 if the patient deems either necessary.            Patient was unable to report the following vital signs today due to visit being performed by telehealth and patient lack of required equipment to obtain: Blood Pressure . Please see flowsheets for any vital signs that were reported this visit.      Patient reported Vital Signs:Ht/Wt    Risks Identified:  BMI Abnormal and/or patient would benefit from meeting with an RD for Chronic Condition Management, Depression , Opioid Use, and Pain    Social Determinants of Health:  Social Determinants of Health Screened today.  Interventions Provided: I provided an intervention for the Health Literacy, Social Connections, and Tobacco Use SDOH domain. The intervention was Education  Health Literacy: Patient states he does not read well, he has his wife or one of his children help with reading medical information if he does not understand.     PCP notified of above risks by  Routing encounter    The following list of current providers and suppliers reviewed and updated this visit.  Patient Care Team:  Lucille Passy, MD as PCP - General (Family Medicine)  Lucille Passy, MD as PCP - [facility] Thane Edu, MD as Consulting Physician (Cardiology)  Raelene Bott, MD as Consulting Physician (Endocrinology)  Deirdre Evener, MD as Consulting Physician (Dermatology)  Rudolpho Sevin, OD as Consulting Physician (Optometry)    Medications and supplements were reviewed and updated this visit. See medication list in encounter summary.     A personalized prevent plan was updated and reviewed with the patient. A copy has been provided to the patient in Patient Instructions.    Recent Hospitalizations reviewed:  No recent hospitalizations     General Health:     Patient's BMI is 34.26 Patient answered No to nutrition services referral: Provided information in AVS on: and DASH diet        Pain identified during today's visit        12/25/21 1002   PainSc: 0-No pain      Chronic Pain Information Provided in AVS and Pain Contract in place  Safety:   Patient answered ADL/IADL's:  Dressing: Independent    Grooming: Independent   Feeding: Independent    Bathing: Independent    Toileting: Independent    Household Duties Independent      Continence Continent            Psychosocial Assessment: Patient lives with his spouse in a private home. He is independent with activities of daily living, medications and driving.  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 He is socially connected with family and friends on a daily basis.        PHQ 2:  Patient had a PHQ 2 score of 0    PHQ 9: N/A    No intervention necessary (RN)PHQ-9:    Patient had a None-Minimal PHQ-9 score of 0  Inform PCP    Patient has been identified on as currently using an opioid medication.   Provide patient education, Chronic Pain and pain managed by PCP      Social Determinants of Health     Financial Resource Strain: Low Risk  (12/25/2021)    Overall Financial Resource Strain (CARDIA)     Difficulty of Paying Living Expenses: Not hard at all   Internet Connectivity: No Internet connectivity concern identified (12/25/2021)    Internet Connectivity     Do you have access to internet services: Yes     How do you connect to the internet: Personal Device at home     Is your internet connection strong enough for you to watch video on your device without major problems?: Yes     Do you have enough data to get through the month?: Yes     Does at least one of the devices have a camera that you can use for video chat?: Yes   Food Insecurity: No Food Insecurity (12/25/2021)    Hunger Vital Sign     Worried About Running Out of Food in the Last Year: Never true     Ran Out of Food in the Last Year: Never true   Tobacco Use: High Risk (12/25/2021)    Patient History     Smoking Tobacco Use: Every Day     Smokeless Tobacco Use: Never     Passive Exposure: Not on file   Housing/Utilities: Low Risk  (12/25/2021)    Housing/Utilities     Within the past 12 months, have you ever stayed: outside, in a car, in a tent, in an overnight shelter, or temporarily in someone else's home (i.e. couch-surfing)?: No     Are you worried about losing your housing?: No     Within the past 12 months, have you been unable to get utilities (heat, electricity) when it was really needed?: No   Alcohol Use: Not At Risk (12/25/2021)    Alcohol Use     How often do you have a drink containing alcohol?: 4+ times per week     How many drinks containing alcohol do you have on a typical day when you are drinking?: 1 - 2     How often do you have 5 or more drinks on one occasion?: Never   Transportation Needs: No Transportation Needs (12/25/2021)    PRAPARE - Transportation     Lack of Transportation (Medical): No     Lack of Transportation (Non-Medical): No   Substance Use: Low Risk  (12/25/2021)    Substance Use     Taken prescription drugs for non-medical reasons:  Never     Taken illegal drugs: Never     Patient indicated they have taken drugs in the past year for non-medical reasons: No   Health Literacy: High Risk (12/25/2021)    Health Literacy     : Often   Physical Activity: Sufficiently Active (12/25/2021)    Exercise Vital Sign     Days of Exercise per Week: 7 days     Minutes of Exercise per Session: 40 min   Interpersonal Safety: Not at risk (12/25/2021)    Interpersonal Safety     Unsafe Where You Currently Live: No     Physically Hurt by Anyone: No     Abused by Anyone: No   Stress: No Stress Concern Present (12/25/2021)    [employer] of Occupational Health - Occupational Stress Questionnaire     Feeling of Stress : Only a little   Intimate Partner Violence: Not At Risk (12/25/2021)    Humiliation, Afraid, Rape, and Kick questionnaire     Fear of Current or Ex-Partner: No     Emotionally Abused: No     Physically Abused: No     Sexually Abused: No   Depression: Not at risk (02/22/2021)    PHQ-2     PHQ-2 Score: 0   Social Connections: Moderately Isolated (12/25/2021)    Social Connection and Isolation Panel [NHANES]     Frequency of Communication with Friends and Family: More than three times a week     Frequency of Social Gatherings with Friends and Family: More than three times a week     Attends Religious Services: Never     Database administrator or Organizations: No     Attends Banker Meetings: Never     Marital Status: Married

## 2021-12-28 ENCOUNTER — Ambulatory Visit: Payer: HMO | Admitting: Dermatology

## 2022-01-04 ENCOUNTER — Encounter
Admission: RE | Admit: 2022-01-04 | Discharge: 2022-01-04 | Disposition: A | Discharge status: Home / Self Care | Payer: PPO | Source: Ambulatory Visit | Attending: Specialist | Admitting: Specialist

## 2022-01-04 ENCOUNTER — Ambulatory Visit: Payer: PPO

## 2022-01-04 VITALS — Ht 69.0 in | Wt 240.0 lb

## 2022-01-04 DIAGNOSIS — Z794 Long term (current) use of insulin: Secondary | ICD-10-CM | POA: Insufficient documentation

## 2022-01-04 DIAGNOSIS — E113553 Type 2 diabetes mellitus with stable proliferative diabetic retinopathy, bilateral: Secondary | ICD-10-CM | POA: Diagnosis not present

## 2022-01-04 DIAGNOSIS — Z01818 Encounter for other preprocedural examination: Secondary | ICD-10-CM | POA: Insufficient documentation

## 2022-01-04 DIAGNOSIS — G4733 Obstructive sleep apnea (adult) (pediatric): Secondary | ICD-10-CM | POA: Diagnosis not present

## 2022-01-04 HISTORY — DX: Anxiety disorder, unspecified: F41.9

## 2022-01-04 NOTE — Patient Instructions (Addendum)
Your procedure is scheduled on: 01/11/22 Report to [place]. To find out your arrival time please call [phone] between 1PM - 3PM on 01/10/22.  Remember: Instructions that are not followed completely may result in serious medical risk, up to and including death, or upon the discretion of your surgeon and anesthesiologist your surgery may need to be rescheduled.     _X__ 1. Do not eat food or drink any liquids after midnight the night before your procedure.                 No gum chewing or hard candies.   __X__2.  On the morning of surgery brush your teeth with toothpaste and water, you                 may rinse your mouth with mouthwash if you wish.  Do not swallow any              toothpaste of mouthwash.     _X__ 3.  No Alcohol for 24 hours before or after surgery.   _X__ 4.  Do Not Smoke or use e-cigarettes For 24 Hours Prior to Your Surgery.                 Do not use any chewable tobacco products for at least 6 hours prior to                 surgery.  ____  5.  Bring all medications with you on the day of surgery if instructed.   __X__  6.  Notify your doctor if there is any change in your medical condition      (cold, fever, infections).     Do not wear jewelry, make-up, hairpins, clips or nail polish. Do not wear lotions, powders, or perfumes. You may wear deodorant Do not shave body hair 48 hours prior to surgery. Men may shave face and neck. Do not bring valuables to the hospital.    [facility] is not responsible for any belongings or valuables.  Contacts, dentures/partials or body piercings may not be worn into surgery. Bring a case for your contacts, glasses or hearing aids, a denture cup will be supplied. Leave your suitcase in the car. After surgery it may be brought to your room. For patients admitted to the hospital, discharge time is determined by your treatment team.   Patients discharged the day of  surgery will not be allowed to drive home.    __X__ Take these medicines the morning of surgery with A SIP OF WATER:    1. amLODipine (NORVASC) 10 MG tablet  2. cetirizine (ZYRTEC) 10 MG tablet  3. pantoprazole (PROTONIX) 40 MG tablet  4. venlafaxine XR (EFFEXOR-XR) 37.5 MG 24 hr capsule  5.  6.  ____ Fleet Enema (as directed)   ____ Use CHG Soap/SAGE wipes as directed  ____ Use inhalers on the day of surgery  __X__ Stop metformin/Janumet/Farxiga 2 days prior to surgery    __X__ Take 1/2 of usual insulin dose the night before surgery. No insulin the morning          of surgery. DECREASE TOUJEO TO 24 UNITS THE NIGHT BEFORE SURGERY ONLY. Use your sliding scale as usual.  ____ Stop Blood Thinners Coumadin/Plavix/Xarelto/Pleta/Pradaxa/Eliquis/Effient/Aspirin  on   Or contact your Surgeon, Cardiologist or Medical Doctor regarding  ability to stop your blood thinners  __X__ Stop Anti-inflammatories 7 days before surgery such as  Advil, Ibuprofen, Motrin,  BC or Goodies Powder, Naprosyn, Naproxen, Aleve, Aspirin   You can use Tylenol if needed. Try a lower dose more often as we discussed.  __X__ Stop all herbal s and supplements, fish oil or vitamins  for 1 week until after surgery.    __X__ Bring C-Pap to the hospital. IF WORKING OR YOU RECEIVE NEW DEVICE   We will contact you regarding holding the Plavix once we've reached out to Dr Humphrey Rolls your cardiologist.

## 2022-01-05 ENCOUNTER — Encounter: Payer: Self-pay | Admitting: Specialist

## 2022-01-05 NOTE — Progress Notes (Signed)
Perioperative Services  Pre-Admission/Anesthesia Testing Clinical Review  Date: 01/10/22  Patient Demographics:  Name: Joshua LOWE Sr. DOB:   02/28/1956 MRN:   [id number]  Planned Surgical Procedure(s):    Case: [id number] Date/Time: 01/11/22 0715   Procedure: BILATERAL CARPAL TUNNEL RELEASE (Left)   Anesthesia type: General   Pre-op diagnosis: G56.02 Carpal tunnel syndrome, left upper limb   Location: [facility] OR ROOM 08 / [place] [facility]S FOR ANESTHESIA GROUP   Surgeons: Earnestine Leys, MD   NOTE: Available PAT nursing documentation and vital signs have been reviewed. Clinical nursing staff has updated patient's PMH/PSHx, current medication list, and drug allergies/intolerances to ensure comprehensive history available to assist in medical decision making as it pertains to the aforementioned surgical procedure and anticipated anesthetic course. Extensive review of available clinical information performed. [place] PMH and PSHx updated with any diagnoses/procedures that  may have been inadvertently omitted during his intake with the pre-admission testing department's nursing staff.  Clinical Discussion:  Joshua TIEU Sr. is a 66 y.o. male who is submitted for pre-surgical anesthesia review and clearance prior to him undergoing the above procedure. Patient is a Current Smoker. Pertinent PMH includes: CAD, atrial fibrillation, diastolic dysfunction, RBBB, palpitations, PVD, HTN, HLD, T2DM, OSAH (requires nocturnal PAP therapy), DOE, chronic back pain, chronic knee pain, opioid use disorder, erectile dysfunction (on PDE5i), history of marijuana use, anxiety (on BZO), insomnia (on hypnotic).  Patient is followed by cardiology Humphrey Rolls, MD). He was last seen in the cardiology clinic on 10/24/2021; notes reviewed.  At the time of his clinic visit, patient doing well overall from a cardiovascular perspective.  He denied any episodes of chest pain, shortness breath, PND, orthopnea, palpitations,  significant peripheral edema, vertiginous symptoms, or presyncope/syncope.  Patient with complaints of claudication pain associated with ambulation.  Patient with a past medical history significant for cardiovascular diagnoses.  Diagnostic left heart catheterization performed on 01/15/2020 revealed a normal left ventricular systolic function with an EF of 60%.  There were 40% stenoses noted both within the mid LAD and OM1.  Intervention was deferred opting for medical management.  Coronary CTA performed and 09/2012 revealing a coronary calcium score of 400.  Study was repeated on 08/16/2014 revealing significant increase in calcium score to 1003.3.  TTE performed on 03/24/2019 demonstrated a low normal left ventricular systolic function with an EF of 50-55%. Diastolic Doppler parameters consistent with abnormal relaxation (G1DD). There was moderate to severe dilation of the left atrium.  There was trivial to mild pulmonary, tricuspid, and mitral valve regurgitation.  There was no evidence of a significant transvalvular gradient to suggest stenosis.  Diagnostic left heart catheterization performed on 03/24/2021 at [facility].  Study revealed a normal left ventricular systolic function with an EF of 60%.  There was 80% stenosis of the proximal RCA and 100% stenosis of the mid LAD.  Recommendations were for consult at [facility] for high risk PCI.  Patient ultimately is seen at [facility] and PCI performed on 05/03/2021 placing a 2.5 x 32 mm Synergy DES to the mid LAD lesion and a 2.5 x 28 mm Synergy DES to the mid to distal LAD yielding excellent angiographic result and TIMI-3 flow.  Following stent placement, patient remains on daily DAPT therapy (ASA + clopidogrel).  The is reportedly compliant with therapy with no evidence or reports of GI bleeding.  Blood pressure reasonably controlled at 132/84 mmHg on currently prescribed CCB (amlodipine), ACEi (lisinopril), and diuretic (HCTZ) therapies.   Patient is on  ezetimibe for his HLD diagnosis and further ASCVD prevention.  In the setting of significant cardiovascular diagnoses, it is important to note that patient is on a PDE5i (sildenafil) + exogenous testosterone injections for his erectile dysfunction diagnosis.  T2DM reasonably controlled on currently prescribed regimen; last HgbA1c was 7.3% when checked on 10/26/2021.  Additionally, patient with a history of opioid use disorder.  He is currently on the buprenorphrine and has a prescription for naloxone to use as needed.  Patient has an OSAH diagnosis and is reportedly compliant with prescribed nocturnal PAP therapy. Functional capacity, as defined by DASI, is documented as being >/= 4 METS.  Given past history, patient was restarted on PCSK9i (evolocumab) with plans to recheck FLP in 2 months.  Additionally, given complaints of claudication pain, peripheral was discontinued and patient was started on ticagrelor.  No other changes were made to his medication regimen.  Patient follow-up with outpatient cardiology in 2 months or sooner if needed.  Joshua Route Sr. is scheduled for an elective BILATERAL CARPAL TUNNEL RELEASE on 01/11/2022 with Dr. Earnestine Leys, MD.  Given patient's past medical history significant for cardiovascular diagnoses, presurgical cardiac clearance was sought by the PAT team. Per cardiology, "this patient is optimized for surgery and may proceed with the planned procedural course with a MODERATE risk of significant perioperative cardiovascular complications". Again, patient is on daily DAPT therapy.  He has been instructed on recommendations from his cardiologist for holding his DAPT medications for 7 days prior to his procedure with plans to restart as soon as postoperative wound is medically minimized by his primary attending surgeon.  Patient is aware that his last doses of his DAPT medications should be on 01/03/2022.  Patient denies previous perioperative complications  with anesthesia in the past. In review of the available records, it is noted that patient underwent a MAC anesthetic course at [facility] ([facility]) in 07/2016 without documented complications.      01/04/2022    4:30 PM 06/13/2021   10:02 AM 03/24/2021   10:30 AM  Vitals with BMI  Height _0  _1    Weight 240 lbs 237 lbs 2 oz   BMI 35.43 36.06   Pulse   58    Providers/Specialists:   NOTE: Primary physician provider listed below. Patient may have been seen by APP or partner within same practice.   PROVIDER ROLE / SPECIALTY LAST Mena Goes, MD Orthopedics (Surgeon) 12/21/2021  Neomia Dear, MD Primary Care Provider 12/06/2021  Neoma Laming, MD Cardiology 10/24/2021  Lavone Orn, MD Endocrinology 01/02/2022   Allergies:  Crestor [rosuvastatin], Lotensin [benazepril], Statins, and Zocor [simvastatin]  Current Home Medications:   No current facility-administered medications for this encounter.    amLODipine (NORVASC) 10 MG tablet   aspirin EC 81 MG tablet   B-D 3CC LUER-LOK SYR 21GX1" 21G X 1" 3 ML MISC   BD HYPODERMIC NEEDLE 18G X 1" MISC   BD ULTRA-FINE PEN NEEDLES 29G X 12.7MM MISC   buprenorphine (SUBUTEX) 8 MG SUBL SL tablet   cetirizine (ZYRTEC) 10 MG tablet   clopidogrel (PLAVIX) 75 MG tablet   Coenzyme Q10 (CO Q-10) 100 MG CAPS   Continuous Blood Gluc Receiver (FREESTYLE LIBRE READER) DEVI   Continuous Blood Gluc Sensor (FREESTYLE LIBRE SENSOR SYSTEM) MISC   diazepam (VALIUM) 5 MG tablet   Evolocumab (REPATHA SURECLICK) 833 MG/ML SOAJ   ezetimibe (ZETIA) 10 MG tablet   gabapentin (NEURONTIN) 300 MG capsule   gentamicin cream (  GARAMYCIN) 0.1 %   ibuprofen (ADVIL) 800 MG tablet   insulin aspart (NOVOLOG) 100 UNIT/ML FlexPen   lisinopril-hydrochlorothiazide (ZESTORETIC) 20-25 MG tablet   metFORMIN (GLUCOPHAGE) 1000 MG tablet   Multiple Vitamins-Minerals (PRESERVISION AREDS 2 PO)   mupirocin ointment (BACTROBAN) 2 %   NARCAN 4 MG/0.1ML  LIQD nasal spray kit   ondansetron (ZOFRAN) 8 MG tablet   pantoprazole (PROTONIX) 40 MG tablet   sildenafil (REVATIO) 20 MG tablet   testosterone cypionate (DEPOTESTOSTERONE CYPIONATE) 200 MG/ML injection   timolol (TIMOPTIC) 0.5 % ophthalmic solution   TOUJEO SOLOSTAR 300 UNIT/ML Solostar Pen   VASCEPA 1 g capsule   venlafaxine XR (EFFEXOR-XR) 37.5 MG 24 hr capsule   zolpidem (AMBIEN) 10 MG tablet    sodium chloride flush (NS) 0.9 % injection 3 mL   History:   Past Medical History:  Diagnosis Date   A-fib ([place]) 1995   Actinic keratosis 06/12/2013   Anterior chest.   Adenomatous polyps    Anxiety    a.) on BZO (diazepam) PRN   CAD (coronary artery disease) 01/15/2011   a.) LHC 01/15/2011: EF 60%; 40% mLAD, 40% OM1 - med mgmt; b.) cCTA 09/2012: Calcium score 400; c.) cCTA 08/16/2014: Calcium score 1003.3; d.) LHC 03/24/2021 [facility]): EF 60%; 80% pRCA, 100 mLAD --> Recommended consult at [facility] for high risk PCI; e.) PCI 05/03/2021 [facility]): CTO mLAD stented with 2.5 x 32 mm Synergy DES (mLAD) and 2.5 x 28 mm Synergy DES (m-dLAD)   Carpal tunnel syndrome    Chronic back pain    Chronic knee pain    Diastolic dysfunction 95/63/8756   a.)  TTE 03/24/2019: EF 50-55%, mild-mod LVH, mod-sev LAE, mild MR/TR/PR, G1DD   Diverticulosis    DOE (dyspnea on exertion)    Erectile dysfunction    a.) on PDE5i (sildenafil)   GERD (gastroesophageal reflux disease)    History of kidney stones    History of marijuana use    Hyperlipidemia    Hyperprolactinemia (HCC)    Hypertension    Hypogonadism in male    a.) on exogenous TRT (depotestosterone cypionate) injections   Insomnia    a.) on hypnotic (zolpidem)   Opioid use disorder    a.) on buprenorphine; b.) has naloxone prescribed   OSA on CPAP    Palpitations    Proliferative diabetic retinopathy of both eyes (HCC)    PVD (peripheral vascular disease) ([place])    RBBB (right bundle branch block)    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 WD SCC, acantholytic (adenoid) variant.   Squamous cell carcinoma of skin 07/03/2018   Left posterior midline scalp. WD SCC, acantholytic (adenoid) variant.   Type 2 diabetes mellitus treated with insulin ([place])    a.) uses Freestyle Libre CGM   Past Surgical History:  Procedure Laterality Date   AQUEOUS SHUNT Left 07/16/2016   Procedure: AQUEOUS SHUNT  left  diabetic;  Surgeon: Ronnell Freshwater, MD;  Location: [place];  Service: Ophthalmology;  Laterality: Left;  ahmed tube shunt diabetic - insulin sleep apnea   CORONARY ANGIOPLASTY WITH STENT PLACEMENT Left 05/03/2021   Procedure: CORONARY ANGIOPLASTY WITH STENT PLACEMENT; Location: [facility]   KNEE ARTHROSCOPY Right    LEFT HEART CATH AND CORONARY ANGIOGRAPHY Left 01/15/2011   Procedure: LEFT HEART CATH AND CORONARY ANGIOGRAPHY; Location: [place]; Surgeon: Neoma Laming, MD   LEFT HEART  CATH AND CORONARY ANGIOGRAPHY N/A 03/24/2021   Procedure: LEFT HEART CATH AND CORONARY ANGIOGRAPHY with intervention;  Surgeon: Dionisio David, MD;  Location: [place] [facility];  Service: Cardiovascular;  Laterality: N/A;   ROTATOR CUFF REPAIR Right 2015   Family History  Problem Relation Age of Onset   Alcohol abuse Father    Heart disease Father    Heart disease Mother    Social History   Tobacco Use   Smoking status: Every Day    Packs/day: 0.75    Years: 30.00    Total pack years: 22.50    Types: Cigarettes   Smokeless tobacco: Never   Tobacco comments:    Working down number of cigarettes a day and has started Chantix  Vaping Use   Vaping Use: Never used  Substance Use Topics   Alcohol use: Yes    Comment: Daily: 4-6 oz. Vodka   Drug use: Yes    Types: Marijuana    Pertinent Clinical Results:  LABS: Labs reviewed: Acceptable for surgery.   Ref Range & Units 12/06/2021  WBC 3.6 - 11.2 10*9/L  9.4   RBC 4.26 - 5.60 10*12/L 5.33   HGB 12.9 - 16.5 g/dL 16.8 High    HCT 39.0 - 48.0 % 49.5 High    MCV 77.6 - 95.7 fL 92.9   MCH 25.9 - 32.4 pg 31.5   MCHC 32.0 - 36.0 g/dL 33.9   RDW 12.2 - 15.2 % 13.5   MPV 6.8 - 10.7 fL 6.7 Low    Platelet 150 - 450 10*9/L 355   nRBC <=4 /100 WBCs 0   Neutrophils % % 65.6   Lymphocytes % % 23.3   Monocytes % % 8.5   Eosinophils % % 1.9   Basophils % % 0.7   Absolute Neutrophils 1.8 - 7.8 10*9/L 6.1   Absolute Lymphocytes 1.1 - 3.6 10*9/L 2.2   Absolute Monocytes 0.3 - 0.8 10*9/L 0.8   Absolute Eosinophils 0.0 - 0.5 10*9/L 0.2   Absolute Basophils 0.0 - 0.1 10*9/L 0.1   Specimen Collected: 12/06/21 11:49   Performed by: [facility] Last Resulted: 12/06/21 16:39  Received From: [place]  Result Received: 12/20/21 15:12    Ref Range & Units 12/06/2021  Sodium 135 - 145 mmol/L 139   Potassium 3.4 - 4.8 mmol/L 4.7   Chloride 98 - 107 mmol/L 101   CO2 20.0 - 31.0 mmol/L 31.1 High    Anion Gap 5 - 14 mmol/L 7   BUN 9 - 23 mg/dL 20   Creatinine 0.60 - 1.10 mg/dL 0.98   BUN/Creatinine Ratio  20   eGFR CKD-EPI (2021) Male >=60 mL/min/1.72m 85   Glucose 70 - 179 mg/dL 172   Calcium 8.7 - 10.4 mg/dL 10.4   Albumin 3.4 - 5.0 g/dL 4.5   Total Protein 5.7 - 8.2 g/dL 8.7 High    Total Bilirubin 0.3 - 1.2 mg/dL 0.6   AST <=34 U/L 36 High    ALT 10 - 49 U/L 44   Alkaline Phosphatase 46 - 116 U/L 119 High    Specimen Collected: 12/06/21 11:49   [facility] Last Resulted: 12/06/21 16:49  Received From: [facility] Result Received: 12/20/21 15:12     ECG: Date: 05/03/2021 Rate: 62 bpm Rhythm: normal sinus; RBBB Axis (leads I and aVF): Left axis deviation Intervals: PR 152 ms. QRS 150 ms. QTc 452 ms. ST segment and T wave changes: No  evidence of acute ST segment elevation or depression Comparison: Similar to previous tracing obtained on 04/20/2021 NOTE: Tracing obtained at [facility];  unable for review. Above based on cardiologist's interpretation.    IMAGING / PROCEDURES: CORONARY ANGIOGRAPHY AND STENT INTERVENTION performed on 05/03/2021 Coronary artery disease including chronic total occlusion of the mid LAD.  Successful IVUS guided CTO intervention of the mid LAD yielding excellent angiographic result and TIMI-3 flow 2.5 x 32 mm Synergy DES x1 to the mid LAD 2.5 x 28 mm Synergy DES x1 to the mid to distal LAD  CARDIAC CTA CORONARIES performed on 04/14/2021 Left coronary artery dominance.  Left main:  Mild distal mixed plaque, minimal stenosis.  LAD:  Proximal segment: severe mixed plaque, luminal irregularity, severe stenosis.  Midsegment: severe plaque, chronic total occlusion.  Distal segment: severe plaque, reconstituted.  Branches: severe mixed plaque and moderate stenosis large first diagonal, mixed plaque septal perforator.  LCX:  Proximal segment: mild mixed plaque, minimal stenosis, positive remodeling.  Midsegment: minimal noncalcified plaque, no stenosis.  Distal segment: distal primarily calcified plaque, mild stenosis.  Branches: small OM1, primarily calcified plaque mild stenosis proximal large OM 2, calcified plaque mild stenosis proximal moderate size OM 3, moderate calcified plaque minimal stenosis posterior descending.  RCA:  Proximal segment: severe mixed plaque, positive remodeling, severe stenosis.  Midsegment: mild plaque mild stenosis.  Distal segment patent.  Branches: large common trunk serves large mid ventricular and A1 branches, large A2 is terminal branch.  Cardiac valves:  No aortic or mitral valve leaflet thickening.  Cardiac morphology and function:  No intracardiac thrombus.  Normal wall thickness left ventricle.  Mildly enlarged right ventricle.   LEFT HEART CATHETERIZATION AND CORONARY ANGIOGRAPHY performed on 03/24/2021 Normal left ventricular systolic function with an EF of 60% Normal LVEDP 80% stenosis of the proximal  RCA 100% stenosis of the mid LAD Recommend referral to [facility] cardiology for high risk PCI   TRANSTHORACIC ECHOCARDIOGRAM performed on 03/24/2019 Low normal left ventricular systolic function with an EF 50-55% Mild to moderate LVH Moderate to severe left atrial enlargement Diastolic Doppler parameters consistent with abnormal relaxation (G1DD). Mild MR, TR, PR Normal gradients; no valvular stenosis  Impression and Plan:  Joshua Route Sr. has been referred for pre-anesthesia review and clearance prior to him undergoing the planned anesthetic and procedural courses. Available labs, pertinent testing, and imaging results were personally reviewed by me. Patient will need repeat ECG on the day of surgery, as updated tracing not available (last 04/2021). This patient has been appropriately cleared by cardiology with an overall MODERATE risk of significant perioperative cardiovascular complications.  Based on clinical review performed today (01/10/22), barring any significant acute changes in the patient's overall condition, it is anticipated that he will be able to proceed with the planned surgical intervention. Any acute changes in clinical condition may necessitate his procedure being postponed and/or cancelled. Patient will meet with anesthesia team (MD and/or CRNA) on the day of his procedure for preoperative evaluation/assessment. Questions regarding anesthetic course will be fielded at that time.   Pre-surgical instructions were reviewed with the patient during his PAT appointment and questions were fielded by PAT clinical staff. Patient was advised that if any questions or concerns arise prior to his procedure then he should return a call to PAT and/or his surgeon's office to discuss.  Honor Loh, MSN, APRN, FNP-C, CEN [facility]  Peri-operative Services Nurse Practitioner Phone: [phone] Fax: [phone] 01/10/22 9:52 AM  NOTE: This note  has been prepared  using [employer]. Despite my best ability to proofread, there is always the potential that unintentional transcriptional errors may still occur from this process.

## 2022-01-09 MED ORDER — CEFAZOLIN SODIUM-DEXTROSE 2-4 GM/100ML-% IV SOLN
INTRAVENOUS | Status: AC
  Filled 2022-01-09: qty 100

## 2022-01-10 ENCOUNTER — Encounter: Payer: Self-pay | Admitting: Specialist

## 2022-01-11 ENCOUNTER — Other Ambulatory Visit: Payer: Self-pay

## 2022-01-11 ENCOUNTER — Encounter: Payer: Self-pay | Admitting: Specialist

## 2022-01-11 ENCOUNTER — Ambulatory Visit: Payer: PPO | Admitting: Urgent Care

## 2022-01-11 ENCOUNTER — Encounter
Admission: RE | Disposition: A | Discharge status: Home / Self Care | Payer: Self-pay | Source: Home / Self Care | Attending: Specialist

## 2022-01-11 ENCOUNTER — Ambulatory Visit
Admission: RE | Admit: 2022-01-11 | Discharge: 2022-01-11 | Disposition: A | Discharge status: Home / Self Care | Payer: PPO | Attending: Specialist | Admitting: Specialist

## 2022-01-11 DIAGNOSIS — I4891 Unspecified atrial fibrillation: Secondary | ICD-10-CM | POA: Insufficient documentation

## 2022-01-11 DIAGNOSIS — Z955 Presence of coronary angioplasty implant and graft: Secondary | ICD-10-CM | POA: Diagnosis not present

## 2022-01-11 DIAGNOSIS — G4733 Obstructive sleep apnea (adult) (pediatric): Secondary | ICD-10-CM | POA: Diagnosis not present

## 2022-01-11 DIAGNOSIS — I2510 Atherosclerotic heart disease of native coronary artery without angina pectoris: Secondary | ICD-10-CM | POA: Insufficient documentation

## 2022-01-11 DIAGNOSIS — E785 Hyperlipidemia, unspecified: Secondary | ICD-10-CM

## 2022-01-11 DIAGNOSIS — F17210 Nicotine dependence, cigarettes, uncomplicated: Secondary | ICD-10-CM | POA: Insufficient documentation

## 2022-01-11 DIAGNOSIS — E1151 Type 2 diabetes mellitus with diabetic peripheral angiopathy without gangrene: Secondary | ICD-10-CM | POA: Insufficient documentation

## 2022-01-11 DIAGNOSIS — G5602 Carpal tunnel syndrome, left upper limb: Secondary | ICD-10-CM | POA: Insufficient documentation

## 2022-01-11 DIAGNOSIS — Z01810 Encounter for preprocedural cardiovascular examination: Secondary | ICD-10-CM

## 2022-01-11 DIAGNOSIS — Z794 Long term (current) use of insulin: Secondary | ICD-10-CM

## 2022-01-11 DIAGNOSIS — F419 Anxiety disorder, unspecified: Secondary | ICD-10-CM | POA: Insufficient documentation

## 2022-01-11 DIAGNOSIS — I10 Essential (primary) hypertension: Secondary | ICD-10-CM | POA: Diagnosis not present

## 2022-01-11 DIAGNOSIS — R0600 Dyspnea, unspecified: Secondary | ICD-10-CM | POA: Diagnosis not present

## 2022-01-11 DIAGNOSIS — F32A Depression, unspecified: Secondary | ICD-10-CM | POA: Diagnosis not present

## 2022-01-11 DIAGNOSIS — K219 Gastro-esophageal reflux disease without esophagitis: Secondary | ICD-10-CM | POA: Insufficient documentation

## 2022-01-11 DIAGNOSIS — E11319 Type 2 diabetes mellitus with unspecified diabetic retinopathy without macular edema: Secondary | ICD-10-CM

## 2022-01-11 DIAGNOSIS — R079 Chest pain, unspecified: Secondary | ICD-10-CM

## 2022-01-11 HISTORY — DX: Benign neoplasm, unspecified site: D36.9

## 2022-01-11 HISTORY — DX: Other chronic pain: G89.29

## 2022-01-11 HISTORY — PX: CARPAL TUNNEL RELEASE: SHX101

## 2022-01-11 HISTORY — DX: Unspecified right bundle-branch block: I45.10

## 2022-01-11 HISTORY — DX: Carpal tunnel syndrome, unspecified upper limb: G56.00

## 2022-01-11 HISTORY — DX: Diverticulosis of intestine, part unspecified, without perforation or abscess without bleeding: K57.90

## 2022-01-11 HISTORY — DX: Cannabis use, unspecified, in remission: F12.91

## 2022-01-11 HISTORY — DX: Hyperprolactinemia: E22.1

## 2022-01-11 HISTORY — DX: Testicular hypofunction: E29.1

## 2022-01-11 HISTORY — DX: Peripheral vascular disease, unspecified: I73.9

## 2022-01-11 HISTORY — DX: Opioid use, unspecified, uncomplicated: F11.90

## 2022-01-11 HISTORY — DX: Type 2 diabetes mellitus without complications: E11.9

## 2022-01-11 HISTORY — DX: Male erectile dysfunction, unspecified: N52.9

## 2022-01-11 HISTORY — DX: Insomnia, unspecified: G47.00

## 2022-01-11 HISTORY — DX: Type 2 diabetes mellitus with proliferative diabetic retinopathy without macular edema, bilateral: E11.3593

## 2022-01-11 HISTORY — DX: Other forms of dyspnea: R06.09

## 2022-01-11 HISTORY — DX: Obstructive sleep apnea (adult) (pediatric): G47.33

## 2022-01-11 HISTORY — DX: Palpitations: R00.2

## 2022-01-11 LAB — GLUCOSE, CAPILLARY
Glucose-Capillary: 123 mg/dL — ABNORMAL HIGH (ref 70–99)
Glucose-Capillary: 96 mg/dL (ref 70–99)

## 2022-01-11 SURGERY — CARPAL TUNNEL RELEASE
Anesthesia: General | Laterality: Left

## 2022-01-11 MED ORDER — CHLORHEXIDINE GLUCONATE CLOTH 2 % EX PADS
6.0000 | MEDICATED_PAD | Freq: Once | CUTANEOUS | Status: AC
  Administered 2022-01-11: 6 via TOPICAL

## 2022-01-11 MED ORDER — HYDROCODONE-ACETAMINOPHEN 5-325 MG PO TABS: 1.0000 | ORAL_TABLET | Freq: Four times a day (QID) | ORAL | 0 refills | Status: DC | PRN

## 2022-01-11 MED ORDER — FENTANYL CITRATE (PF) 100 MCG/2ML IJ SOLN
INTRAMUSCULAR | Status: AC
  Filled 2022-01-11: qty 2

## 2022-01-11 MED ORDER — KETOROLAC TROMETHAMINE 30 MG/ML IJ SOLN
INTRAMUSCULAR | Status: AC
  Filled 2022-01-11: qty 1

## 2022-01-11 MED ORDER — SODIUM CHLORIDE 0.9 % IV SOLN: INTRAVENOUS | Status: DC

## 2022-01-11 MED ORDER — CHLORHEXIDINE GLUCONATE 0.12 % MT SOLN
OROMUCOSAL | Status: AC
  Administered 2022-01-11: 15 mL via OROMUCOSAL
  Filled 2022-01-11: qty 15

## 2022-01-11 MED ORDER — GABAPENTIN 400 MG PO CAPS: 400.0000 mg | ORAL_CAPSULE | Freq: Three times a day (TID) | ORAL | 3 refills | Status: DC

## 2022-01-11 MED ORDER — PROPOFOL 10 MG/ML IV BOLUS
INTRAVENOUS | Status: AC
  Filled 2022-01-11: qty 40

## 2022-01-11 MED ORDER — BUPIVACAINE HCL (PF) 0.5 % IJ SOLN
INTRAMUSCULAR | Status: AC
  Filled 2022-01-11: qty 30

## 2022-01-11 MED ORDER — EPHEDRINE 5 MG/ML INJ
INTRAVENOUS | Status: AC
  Filled 2022-01-11: qty 5

## 2022-01-11 MED ORDER — FENTANYL CITRATE (PF) 100 MCG/2ML IJ SOLN: 25.0000 ug | INTRAMUSCULAR | Status: DC | PRN

## 2022-01-11 MED ORDER — GABAPENTIN 300 MG PO CAPS: 300.0000 mg | ORAL_CAPSULE | ORAL | Status: DC

## 2022-01-11 MED ORDER — BUPIVACAINE HCL 0.5 % IJ SOLN
INTRAMUSCULAR | Status: DC | PRN
  Administered 2022-01-11: 17 mL

## 2022-01-11 MED ORDER — ORAL CARE MOUTH RINSE: 15.0000 mL | Freq: Once | OROMUCOSAL | Status: AC

## 2022-01-11 MED ORDER — CHLORHEXIDINE GLUCONATE 0.12 % MT SOLN: 15.0000 mL | Freq: Once | OROMUCOSAL | Status: AC

## 2022-01-11 MED ORDER — MELOXICAM 7.5 MG PO TABS
ORAL_TABLET | ORAL | Status: AC
  Administered 2022-01-11: 15 mg via ORAL
  Filled 2022-01-11: qty 2

## 2022-01-11 MED ORDER — MIDAZOLAM HCL 2 MG/2ML IJ SOLN
INTRAMUSCULAR | Status: DC | PRN
  Administered 2022-01-11: 2 mg via INTRAVENOUS

## 2022-01-11 MED ORDER — LIDOCAINE HCL (PF) 2 % IJ SOLN
INTRAMUSCULAR | Status: AC
  Filled 2022-01-11: qty 5

## 2022-01-11 MED ORDER — DEXAMETHASONE SODIUM PHOSPHATE 10 MG/ML IJ SOLN
INTRAMUSCULAR | Status: DC | PRN
  Administered 2022-01-11: 10 mg via INTRAVENOUS

## 2022-01-11 MED ORDER — FENTANYL CITRATE (PF) 100 MCG/2ML IJ SOLN
INTRAMUSCULAR | Status: DC | PRN
  Administered 2022-01-11 (×2): 50 ug via INTRAVENOUS

## 2022-01-11 MED ORDER — CEFAZOLIN SODIUM-DEXTROSE 2-4 GM/100ML-% IV SOLN
INTRAVENOUS | Status: AC
  Filled 2022-01-11: qty 100

## 2022-01-11 MED ORDER — EPHEDRINE SULFATE (PRESSORS) 50 MG/ML IJ SOLN
INTRAMUSCULAR | Status: DC | PRN
  Administered 2022-01-11: 10 mg via INTRAVENOUS

## 2022-01-11 MED ORDER — ONDANSETRON HCL 4 MG/2ML IJ SOLN
INTRAMUSCULAR | Status: AC
  Filled 2022-01-11: qty 2

## 2022-01-11 MED ORDER — CEFAZOLIN SODIUM-DEXTROSE 2-4 GM/100ML-% IV SOLN
2.0000 g | INTRAVENOUS | Status: AC
  Administered 2022-01-11: 2 g via INTRAVENOUS

## 2022-01-11 MED ORDER — DEXAMETHASONE SODIUM PHOSPHATE 10 MG/ML IJ SOLN
INTRAMUSCULAR | Status: AC
  Filled 2022-01-11: qty 1

## 2022-01-11 MED ORDER — MIDAZOLAM HCL 2 MG/2ML IJ SOLN
INTRAMUSCULAR | Status: AC
  Filled 2022-01-11: qty 2

## 2022-01-11 MED ORDER — PROPOFOL 10 MG/ML IV BOLUS
INTRAVENOUS | Status: DC | PRN
  Administered 2022-01-11: 50 mg via INTRAVENOUS
  Administered 2022-01-11: 150 mg via INTRAVENOUS

## 2022-01-11 MED ORDER — GABAPENTIN 300 MG PO CAPS
ORAL_CAPSULE | ORAL | Status: AC
  Filled 2022-01-11: qty 1

## 2022-01-11 MED ORDER — MELOXICAM 7.5 MG PO TABS: 15.0000 mg | ORAL_TABLET | ORAL | Status: AC

## 2022-01-11 MED ORDER — LIDOCAINE HCL (CARDIAC) PF 100 MG/5ML IV SOSY
PREFILLED_SYRINGE | INTRAVENOUS | Status: DC | PRN
  Administered 2022-01-11: 100 mg via INTRAVENOUS

## 2022-01-11 MED ORDER — PHENYLEPHRINE 80 MCG/ML (10ML) SYRINGE FOR IV PUSH (FOR BLOOD PRESSURE SUPPORT)
PREFILLED_SYRINGE | INTRAVENOUS | Status: DC | PRN
  Administered 2022-01-11 (×2): 160 ug via INTRAVENOUS
  Administered 2022-01-11 (×3): 80 ug via INTRAVENOUS

## 2022-01-11 MED ORDER — ONDANSETRON HCL 4 MG/2ML IJ SOLN
INTRAMUSCULAR | Status: DC | PRN
  Administered 2022-01-11: 4 mg via INTRAVENOUS

## 2022-01-11 MED ORDER — PHENYLEPHRINE 80 MCG/ML (10ML) SYRINGE FOR IV PUSH (FOR BLOOD PRESSURE SUPPORT)
PREFILLED_SYRINGE | INTRAVENOUS | Status: AC
  Filled 2022-01-11: qty 10

## 2022-01-11 MED ORDER — MELOXICAM 15 MG PO TABS: 15.0000 mg | ORAL_TABLET | Freq: Every day | ORAL | 3 refills | Status: AC

## 2022-01-11 MED ORDER — 0.9 % SODIUM CHLORIDE (POUR BTL) OPTIME
TOPICAL | Status: DC | PRN
  Administered 2022-01-11: 500 mL

## 2022-01-11 SURGICAL SUPPLY — 30 items
APL PRP STRL LF DISP 70% ISPRP (MISCELLANEOUS) ×1
BLADE SURG MINI STRL (BLADE) ×1 IMPLANT
BNDG ESMARK 4X12 TAN STRL LF (GAUZE/BANDAGES/DRESSINGS) ×1 IMPLANT
CHLORAPREP W/TINT 26 (MISCELLANEOUS) ×1 IMPLANT
CUFF TOURN SGL QUICK 18X4 (TOURNIQUET CUFF) IMPLANT
DRSG GAUZE FLUFF 36X18 (GAUZE/BANDAGES/DRESSINGS) ×2 IMPLANT
ELECT REM PT RETURN 9FT ADLT (ELECTROSURGICAL) ×1
ELECTRODE REM PT RTRN 9FT ADLT (ELECTROSURGICAL) ×1 IMPLANT
GAUZE XEROFORM 1X8 LF (GAUZE/BANDAGES/DRESSINGS) ×1 IMPLANT
GLOVE BIO SURGEON STRL SZ8 (GLOVE) ×1 IMPLANT
GOWN STRL REUS W/ TWL LRG LVL3 (GOWN DISPOSABLE) ×1 IMPLANT
GOWN STRL REUS W/TWL LRG LVL3 (GOWN DISPOSABLE) ×1
GOWN STRL REUS W/TWL LRG LVL4 (GOWN DISPOSABLE) ×1 IMPLANT
KIT TURNOVER KIT A (KITS) ×1 IMPLANT
MANIFOLD NEPTUNE II (INSTRUMENTS) ×1 IMPLANT
NS IRRIG 500ML POUR BTL (IV SOLUTION) ×1 IMPLANT
PACK EXTREMITY ARMC (MISCELLANEOUS) ×1 IMPLANT
PAD PREP 24X41 OB/GYN DISP (PERSONAL CARE ITEMS) ×1 IMPLANT
PADDING CAST BLEND 4X4 STRL (MISCELLANEOUS) ×1 IMPLANT
SPLINT CAST 1 STEP 3X12 (MISCELLANEOUS) ×1 IMPLANT
STOCKINETTE 48X4 2 PLY STRL (GAUZE/BANDAGES/DRESSINGS) ×1 IMPLANT
STOCKINETTE BIAS CUT 4 980044 (GAUZE/BANDAGES/DRESSINGS) ×1 IMPLANT
STOCKINETTE STRL 4IN 9604848 (GAUZE/BANDAGES/DRESSINGS) ×1 IMPLANT
STOCKINETTE STRL 6IN 960660 (GAUZE/BANDAGES/DRESSINGS) ×1 IMPLANT
SUT ETHILON 4-0 (SUTURE) ×1
SUT ETHILON 4-0 FS2 18XMFL BLK (SUTURE) ×1
SUT ETHILON 5-0 FS-2 18 BLK (SUTURE) ×1 IMPLANT
SUTURE ETHLN 4-0 FS2 18XMF BLK (SUTURE) ×1 IMPLANT
TRAP FLUID SMOKE EVACUATOR (MISCELLANEOUS) ×1 IMPLANT
WATER STERILE IRR 500ML POUR (IV SOLUTION) ×1 IMPLANT

## 2022-01-11 NOTE — H&P (Signed)
THE PATIENT WAS SEEN PRIOR TO SURGERY TODAY.  HISTORY, ALLERGIES, HOME MEDICATIONS AND OPERATIVE PROCEDURE WERE REVIEWED. RISKS AND BENEFITS OF SURGERY DISCUSSED WITH PATIENT AGAIN.  NO CHANGES FROM INITIAL HISTORY AND PHYSICAL NOTED.    

## 2022-01-11 NOTE — Op Note (Signed)
01/11/2022  8:54 AM  PATIENT:  Joshua Route Sr.    PRE-OPERATIVE DIAGNOSIS: LEFT CARPAL TUNNEL SYNDROME  POST-OPERATIVE DIAGNOSIS: LEFT CARPAL TUNNEL SYNDROME  PROCEDURE:  LEFT CARPAL TUNNEL RELEASE  SURGEON: Park Breed, MD  TOURNIQUET TIME: 24  MIN   ANESTHESIA:   General  PREOPERATIVE INDICATIONS:  Joshua DEAKINS Sr. is a  66 y.o. male with a diagnosis of left carpal tunnel syndrome who failed conservative measures and elected for surgical management.    The risks benefits and alternatives were discussed with the patient preoperatively including but not limited to the risks of infection, bleeding, nerve injury, incomplete relief of symptoms, pillar pain, cardiopulmonary complications, the need for revision surgery, among others, and the patient was willing to proceed.  OPERATIVE FINDINGS: Thickened volar ligament and nerve compression.  OPERATIVE PROCEDURE: The patient is brought to the operating room placed in the supine position. General anesthesia was administered. The left upper extremity was prepped and draped in usual sterile fashion. Time out was performed. The arm was elevated and exsanguinated and the tourniquet was inflated. Incision was made in line with the radial border of the ring finger. The carpal tunnel transverse fascia was identified, cleaned, and incised sharply. The common sensory branches were visualized along with the superficial palmar arch and protected.  The median nerve was protected below. A Kelly clamp was  placed underneath the transverse carpal ligament, protecting the nerve. I released the ligament completely, and then released the proximal distal volar forearm fascia. The nerve was identified, and visualized, and protected throughout the case. The motor branch was intact upon inspection. No masses or abnormalities were identified in the ulnar bursa.  The wounds were irrigated copiously and the skin closed with nylon. The wound was injected with  1/2 % marcaine followed by a sterile dressing and volar splint. Tourniquet was deflated with good return of blood flow to all fingers. Sponge and needle counts were correct.  The patient tolerated this well, with no complications. The patient was awakened and taken to recovery in good condition.

## 2022-01-11 NOTE — Transfer of Care (Signed)
Immediate Anesthesia Transfer of Care Note  Patient: Joshua Route Sr.  Procedure(s) Performed: CARPAL TUNNEL RELEASE (Left)  Patient Location: PACU  Anesthesia Type:General  Level of Consciousness: drowsy  Airway & Oxygen Therapy: Patient Spontanous Breathing and Patient connected to face mask oxygen  Post-op Assessment: Report given to RN and Post -op Vital signs reviewed and stable  Post vital signs: Reviewed and stable  Last Vitals:  Vitals Value Taken Time  BP 85/50 01/11/22 0848  Temp    Pulse 56 01/11/22 0852  Resp 12 01/11/22 0852  SpO2 98 % 01/11/22 0852  Vitals shown include unvalidated device data.  Last Pain:  Vitals:   01/11/22 0626  TempSrc: Temporal  PainSc: 0-No pain         Complications: No notable events documented.

## 2022-01-11 NOTE — Discharge Instructions (Signed)
AMBULATORY SURGERY  ?DISCHARGE INSTRUCTIONS ? ? ?The drugs that you were given will stay in your system until tomorrow so for the next 24 hours you should not: ? ?Drive an automobile ?Make any legal decisions ?Drink any alcoholic beverage ? ? ?You may resume regular meals tomorrow.  Today it is better to start with liquids and gradually work up to solid foods. ? ?You may eat anything you prefer, but it is better to start with liquids, then soup and crackers, and gradually work up to solid foods. ? ? ?Please notify your doctor immediately if you have any unusual bleeding, trouble breathing, redness and pain at the surgery site, drainage, fever, or pain not relieved by medication. ? ? ? ?Additional Instructions: ? ? ? ?Please contact your physician with any problems or Same Day Surgery at [phone], Monday through Friday 6 am to 4 pm, or [place] [facility] [place] Main number at [phone].  ?

## 2022-01-11 NOTE — Anesthesia Preprocedure Evaluation (Signed)
Anesthesia Evaluation  Patient identified by MRN, date of birth, ID band Patient awake    Reviewed: Allergy & Precautions, H&P , NPO status , Patient's Chart, lab work & pertinent test results, reviewed documented beta blocker date and time   History of Anesthesia Complications Negative for: history of anesthetic complications  Airway Mallampati: III  TM Distance: >3 FB Neck ROM: full    Dental  (+) Dental Advidsory Given, Caps, Poor Dentition, Missing   Pulmonary neg shortness of breath, sleep apnea and Continuous Positive Airway Pressure Ventilation , neg COPD, neg recent URI, Current Smoker and Patient abstained from smoking.,    Pulmonary exam normal breath sounds clear to auscultation       Cardiovascular Exercise Tolerance: Good hypertension, (-) angina+ CAD, + Cardiac Stents, + Peripheral Vascular Disease and + DOE  (-) Past MI and (-) CABG + dysrhythmias Atrial Fibrillation (-) Valvular Problems/Murmurs Rhythm:regular Rate:Normal     Neuro/Psych PSYCHIATRIC DISORDERS Anxiety Depression negative neurological ROS     GI/Hepatic Neg liver ROS, GERD  ,  Endo/Other  diabetes  Renal/GU negative Renal ROS  negative genitourinary   Musculoskeletal   Abdominal   Peds  Hematology negative hematology ROS (+)   Anesthesia Other Findings Past Medical History: 1995: A-fib ([place]) 06/12/2013: Actinic keratosis     Comment:  Anterior chest. No date: Adenomatous polyps No date: Anxiety     Comment:  a.) on BZO (diazepam) PRN 01/15/2011: CAD (coronary artery disease)     Comment:  a.) LHC 01/15/2011: EF 60%; 40% mLAD, 40% OM1 - med               mgmt; b.) cCTA 09/2012: Calcium score 400; c.) cCTA               08/16/2014: Calcium score 1003.3; d.) LHC 03/24/2021               [facility]): EF 60%; 80% pRCA, 100 mLAD --> Recommended               consult at [facility] for high risk PCI; e.) PCI 05/03/2021               [facility]): CTO mLAD  stented with 2.5 x 32 mm Synergy DES               (mLAD) and 2.5 x 28 mm Synergy DES (m-dLAD) No date: Carpal tunnel syndrome No date: Chronic back pain No date: Chronic knee pain 62/94/7654: Diastolic dysfunction     Comment:  a.)  TTE 03/24/2019: EF 50-55%, mild-mod LVH, mod-sev               LAE, mild MR/TR/PR, G1DD No date: Diverticulosis No date: DOE (dyspnea on exertion) No date: Erectile dysfunction     Comment:  a.) on PDE5i (sildenafil) No date: GERD (gastroesophageal reflux disease) No date: History of kidney stones No date: History of marijuana use No date: Hyperlipidemia No date: Hyperprolactinemia (HCC) No date: Hypertension No date: Hypogonadism in male     Comment:  a.) on exogenous TRT (depotestosterone cypionate)               injections No date: Insomnia     Comment:  a.) on hypnotic (zolpidem) No date: Opioid use disorder     Comment:  a.) on buprenorphine; b.) has naloxone prescribed No date: OSA on CPAP No date: Palpitations No date: 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 SCCis, hypertrophic 07/03/2018: Squamous cell carcinoma of skin     Comment:  Right posterior aspect midline scalp. WD SCC,               acantholytic (adenoid) variant. 07/03/2018: Squamous cell carcinoma of skin     Comment:  Left posterior midline scalp. WD SCC, acantholytic               (adenoid) variant. No date: Type 2 diabetes mellitus treated with insulin (HCC)     Comment:  a.) uses Freestyle Libre CGM   Reproductive/Obstetrics negative OB ROS                             Anesthesia Physical Anesthesia Plan  ASA: 3  Anesthesia Plan: General   Post-op Pain Management:    Induction: Intravenous  PONV Risk Score and Plan: 1 and Ondansetron, Dexamethasone and Treatment  may vary due to age or medical condition  Airway Management Planned: LMA  Additional Equipment:   Intra-op Plan:   Post-operative Plan: Extubation in OR  Informed Consent: I have reviewed the patients History and Physical, chart, labs and discussed the procedure including the risks, benefits and alternatives for the proposed anesthesia with the patient or authorized representative who has indicated his/her understanding and acceptance.     Dental Advisory Given  Plan Discussed with: Anesthesiologist, CRNA and Surgeon  Anesthesia Plan Comments:         Anesthesia Quick Evaluation

## 2022-01-11 NOTE — Anesthesia Procedure Notes (Signed)
Procedure Name: LMA Insertion Date/Time: 01/11/2022 7:52 AM  Performed by: Esaw Grandchild, CRNAPre-anesthesia Checklist: Patient identified, Emergency Drugs available, Suction available and Patient being monitored Patient Re-evaluated:Patient Re-evaluated prior to induction Oxygen Delivery Method: Circle system utilized Preoxygenation: Pre-oxygenation with 100% oxygen Induction Type: IV induction Ventilation: Mask ventilation without difficulty LMA: LMA inserted LMA Size: 4.0 Number of attempts: 1 Placement Confirmation: positive ETCO2 and breath sounds checked- equal and bilateral Tube secured with: Tape Dental Injury: Teeth and Oropharynx as per pre-operative assessment

## 2022-01-12 ENCOUNTER — Encounter: Payer: Self-pay | Admitting: Specialist

## 2022-01-16 NOTE — Anesthesia Postprocedure Evaluation (Signed)
Anesthesia Post Note  Patient: Joshua Route Sr.  Procedure(s) Performed: CARPAL TUNNEL RELEASE (Left)  Patient location during evaluation: PACU Anesthesia Type: General Level of consciousness: awake and alert Pain management: pain level controlled Vital Signs Assessment: post-procedure vital signs reviewed and stable Respiratory status: spontaneous breathing, nonlabored ventilation, respiratory function stable and patient connected to nasal cannula oxygen Cardiovascular status: blood pressure returned to baseline and stable Postop Assessment: no apparent nausea or vomiting Anesthetic complications: no   No notable events documented.   Last Vitals:  Vitals:   01/11/22 0922 01/11/22 0932  BP:  114/68  Pulse: 62 64  Resp: 12 16  Temp: 36.5 C (!) 36.2 C  SpO2: 94% 95%    Last Pain:  Vitals:   01/11/22 0932  TempSrc:   PainSc: 0-No pain                 Martha Clan

## 2022-01-24 ENCOUNTER — Telehealth: Payer: Self-pay | Admitting: *Deleted

## 2022-01-24 ENCOUNTER — Encounter: Payer: Self-pay | Admitting: *Deleted

## 2022-01-24 NOTE — Patient Outreach (Signed)
  Care Coordination   Initial Visit Note   01/24/2022 Name: Joshua DOBEK Sr. MRN: [id number] DOB: 08-27-1955  Joshua Route Sr. is a 66 y.o. year old male who sees Neomia Dear, MD for primary care. I spoke with  Joshua Route Sr. by phone today.  What matters to the patients health and wellness today?  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" (pt-stated)        Care Coordination Interventions: Counseled on importance of regular laboratory monitoring as prescribed Review of patient status, including review of consultants reports, relevant laboratory and other test results, and medications completed Patient interviewed about adult health maintenance status including  importance of yearly Annual Wellness Visit, completed on 12/25/21 Provided education about importance of taking all medications as prescribed, attending all scheduled appointments Reviewed carbohydrate modified diet, importance of exercise (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          SDOH assessments and interventions completed:  Yes  SDOH Interventions Today    Flowsheet Row Most Recent Value  SDOH Interventions   Food Insecurity Interventions Intervention Not Indicated  Transportation Interventions Intervention Not Indicated        Care Coordination Interventions Activated:  Yes  Care Coordination Interventions:  Yes, provided   Follow up plan: No further intervention required.   Encounter Outcome:  Pt. Visit Completed   Jacqlyn Larsen [facility], BSN [facility] RN Care Coordinator [phone]

## 2022-02-06 ENCOUNTER — Other Ambulatory Visit: Payer: Self-pay | Admitting: Specialist

## 2022-02-06 NOTE — H&P (Addendum)
Joshua Route Sr. is an 66 y.o. male.   Chief Complaint: Left hand pain and numbness  HPI: Joshua Fowler is a 66 year old male with significant right carpal tunnel syndrome.  He has significant numbness and tingling of the hand and weakness of pinch and grip.  Nerve conduction studies show advanced carpal tunnel disease.  He has failed conservative treatment for this.  He wishes to proceed with surgery.  The risks and benefits and postop protocol of carpal tunnel surgery were discussed with him at length and he wished to proceed.  Past Medical History:  Diagnosis Date   A-fib ([place]) 1995   Actinic keratosis 06/12/2013   Anterior chest.   Adenomatous polyps    Anxiety    a.) on BZO (diazepam) PRN   CAD (coronary artery disease) 01/15/2011   a.) LHC 01/15/2011: EF 60%; 40% mLAD, 40% OM1 - med mgmt; b.) cCTA 09/2012: Calcium score 400; c.) cCTA 08/16/2014: Calcium score 1003.3; d.) LHC 03/24/2021 [facility]): EF 60%; 80% pRCA, 100 mLAD --> Recommended consult at [facility] for high risk PCI; e.) PCI 05/03/2021 [facility]): CTO mLAD stented with 2.5 x 32 mm Synergy DES (mLAD) and 2.5 x 28 mm Synergy DES (m-dLAD)   Carpal tunnel syndrome    Chronic back pain    Chronic knee pain    Diastolic dysfunction 13/24/4010   a.)  TTE 03/24/2019: EF 50-55%, mild-mod LVH, mod-sev LAE, mild MR/TR/PR, G1DD   Diverticulosis    DOE (dyspnea on exertion)    Erectile dysfunction    a.) on PDE5i (sildenafil)   GERD (gastroesophageal reflux disease)    History of kidney stones    History of marijuana use    Hyperlipidemia    Hyperprolactinemia (HCC)    Hypertension    Hypogonadism in male    a.) on exogenous TRT (depotestosterone cypionate) injections   Insomnia    a.) on hypnotic (zolpidem)   Opioid use disorder    a.) on buprenorphine; b.) has naloxone prescribed   OSA on CPAP    Palpitations    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 WD SCC, acantholytic (adenoid) variant.   Squamous cell carcinoma of skin 07/03/2018   Left posterior midline scalp. WD SCC, acantholytic (adenoid) variant.   Type 2 diabetes mellitus treated with insulin ([place])    a.) uses Freestyle Libre CGM    Past Surgical History:  Procedure Laterality Date   AQUEOUS SHUNT Left 07/16/2016   Procedure: AQUEOUS SHUNT  left  diabetic;  Surgeon: Ronnell Freshwater, MD;  Location: [place];  Service: Ophthalmology;  Laterality: Left;  ahmed tube shunt diabetic - insulin sleep apnea   CARPAL TUNNEL RELEASE Left 01/11/2022   Procedure: CARPAL TUNNEL RELEASE;  Surgeon: Earnestine Leys, MD;  Location: [facility];  Service: Orthopedics;  Laterality: Left;   CORONARY ANGIOPLASTY WITH STENT PLACEMENT Left 05/03/2021   Procedure: CORONARY ANGIOPLASTY WITH STENT PLACEMENT; Location: [facility]   KNEE ARTHROSCOPY Right    LEFT HEART CATH AND CORONARY ANGIOGRAPHY Left 01/15/2011   Procedure: LEFT HEART CATH AND CORONARY ANGIOGRAPHY; Location: [place]; Surgeon: Neoma Laming, MD   LEFT HEART CATH AND CORONARY ANGIOGRAPHY N/A 03/24/2021   Procedure: LEFT HEART CATH AND CORONARY ANGIOGRAPHY with intervention;  Surgeon: Dionisio David, MD;  Location: [place] [facility];  Service: Cardiovascular;  Laterality: N/A;   ROTATOR CUFF REPAIR Right 2015    Family History  Problem Relation Age of Onset   Alcohol abuse Father    Heart disease Father    Heart disease Mother    Social History:  reports that he has been smoking cigarettes. He has a 22.50 pack-year smoking history. He has never used smokeless tobacco. He reports current alcohol use. He reports current drug use. Drug: Marijuana.  Allergies:  Allergies  Allergen Reactions   Crestor [Rosuvastatin]     myalgia   Lotensin  [Benazepril] Other (See Comments)    GI upset   Statins Nausea Only    Nausea w/ rosuvastatin and simvastatin   Zocor [Simvastatin]     myalgia    No medications prior to admission.    No results found for this or any previous visit (from the past 48 hour(s)). No results found.  Review of Systems  There were no vitals taken for this visit. Right ft hand that has some thenar atrophy.  Pinch is weak.  He has a positive median nerve compression test.  His gross sensation is slightly decreased.    Assessment/Plan Severe right carpal tunnel syndrome  Left carpal tunnel release  Park Breed, MD 02/06/2022, 10:49 AM

## 2022-02-07 ENCOUNTER — Other Ambulatory Visit: Payer: Self-pay

## 2022-02-07 ENCOUNTER — Encounter
Admission: RE | Admit: 2022-02-07 | Discharge: 2022-02-07 | Disposition: A | Discharge status: Home / Self Care | Payer: HMO | Source: Ambulatory Visit | Attending: Specialist | Admitting: Specialist

## 2022-02-07 DIAGNOSIS — Z01812 Encounter for preprocedural laboratory examination: Secondary | ICD-10-CM

## 2022-02-07 HISTORY — DX: Unspecified osteoarthritis, unspecified site: M19.90

## 2022-02-07 NOTE — Patient Instructions (Addendum)
Your procedure is scheduled on: 02/15/22 - Thursday Report to the Registration Desk on the 1st floor of the [facility]. To find out your arrival time, please call [phone] between 1PM - 3PM on: 02/14/22 - Wednesday If your arrival time is 6:00 am, do not arrive prior to that time as the [facility] entrance doors do not open until 6:00 am.  REMEMBER: Instructions that are not followed completely may result in serious medical risk, up to and including death; or upon the discretion of your surgeon and anesthesiologist your surgery may need to be rescheduled.  Do not eat food or drink any liquids after midnight the night before surgery.  No gum chewing, lozengers or hard candies.   TAKE THESE MEDICATIONS THE MORNING OF SURGERY WITH A SIP OF WATER:  1. amLODipine (NORVASC) 10 MG tablet 2. cetirizine (ZYRTEC) 10 MG tablet 3. pantoprazole (PROTONIX) 40 MG tablet 4. venlafaxine XR (EFFEXOR-XR) 37.5 MG 24 hr capsule 5. timolol (TIMOPTIC)   HOLD metFORMIN (GLUCOPHAGE) beginning 02/13/22.   HOLD clopidogrel (PLAVIX) 7 days prior to your surgery beginning 02/08/22.  HOLD aspirin EC 7 days prior to your surgery beginning 02/08/22 .   Take 1/2 of usual insulin dose the night before surgery. No insulin the morning of surgery. DECREASE TOUJEO TO 24 UNITS THE NIGHT BEFORE SURGERY ONLY. Use your sliding scale as usual.  One week prior to surgery: meloxicam [facility])  Stop Anti-inflammatories (NSAIDS) such as Advil, Aleve, Ibuprofen, Motrin, Naproxen, Naprosyn and Aspirin based products such as Excedrin, Goodys Powder, BC Powder.   Stop ANY OVER THE COUNTER supplements until after surgery. Coenzyme Q10 , Multiple Vitamins-Minerals .  You may however, continue to take Tylenol if needed for pain up until the day of surgery.  No Alcohol for 24 hours before or after surgery.  No Smoking including e-cigarettes for 24 hours prior to surgery.  No chewable tobacco products for at least 6 hours  prior to surgery.  No nicotine patches on the day of surgery.  Do not use any "recreational" drugs for at least a week prior to your surgery.  Please be advised that the combination of cocaine and anesthesia may have negative outcomes, up to and including death. If you test positive for cocaine, your surgery will be cancelled.  On the morning of surgery brush your teeth with toothpaste and water, you may rinse your mouth with mouthwash if you wish. Do not swallow any toothpaste or mouthwash.  Use CHG Soap or wipes as directed on instruction sheet.  Do not wear jewelry, make-up, hairpins, clips or nail polish.  Do not wear lotions, powders, or perfumes.   Do not shave body from the neck down 48 hours prior to surgery just in case you cut yourself which could leave a site for infection.  Also, freshly shaved skin may become irritated if using the CHG soap.  Contact lenses, hearing aids and dentures may not be worn into surgery.  Do not bring valuables to the hospital. [facility] is not responsible for any missing/lost belongings or valuables.   Bring your C-PAP to the hospital with you in case you may have to spend the night.   Notify your doctor if there is any change in your medical condition (cold, fever, infection).  Wear comfortable clothing (specific to your surgery type) to the hospital.  After surgery, you can help prevent lung complications by doing breathing exercises.  Take deep breaths and cough every 1-2 hours. Your doctor may order a  device called an Incentive Spirometer to help you take deep breaths. When coughing or sneezing, hold a pillow firmly against your incision with both hands. This is called "splinting." Doing this helps protect your incision. It also decreases belly discomfort.  If you are being admitted to the hospital overnight, leave your suitcase in the car. After surgery it may be brought to your room.  If you are being discharged the day of surgery,  you will not be allowed to drive home. You will need a responsible adult (18 years or older) to drive you home and stay with you that night.   If you are taking public transportation, you will need to have a responsible adult (18 years or older) with you. Please confirm with your physician that it is acceptable to use public transportation.   Please call the [place] Dept. at [phone] if you have any questions about these instructions.  Surgery Visitation Policy:  Patients undergoing a surgery or procedure may have two family members or support persons with them as long as the person is not COVID-19 positive or experiencing its symptoms.   Inpatient Visitation:    Visiting hours are 7 a.m. to 8 p.m. Up to four visitors are allowed at one time in a patient room, including children. The visitors may rotate out with other people during the day. One designated support person (adult) may remain overnight.

## 2022-02-15 ENCOUNTER — Encounter
Admission: RE | Disposition: A | Discharge status: Home / Self Care | Payer: Self-pay | Source: Home / Self Care | Attending: Specialist

## 2022-02-15 ENCOUNTER — Other Ambulatory Visit: Payer: Self-pay

## 2022-02-15 ENCOUNTER — Ambulatory Visit: Payer: HMO | Admitting: Urgent Care

## 2022-02-15 ENCOUNTER — Encounter: Payer: Self-pay | Admitting: Specialist

## 2022-02-15 ENCOUNTER — Ambulatory Visit
Admission: RE | Admit: 2022-02-15 | Discharge: 2022-02-15 | Disposition: A | Discharge status: Home / Self Care | Payer: HMO | Attending: Specialist | Admitting: Specialist

## 2022-02-15 DIAGNOSIS — Z01812 Encounter for preprocedural laboratory examination: Secondary | ICD-10-CM

## 2022-02-15 DIAGNOSIS — G5603 Carpal tunnel syndrome, bilateral upper limbs: Secondary | ICD-10-CM | POA: Insufficient documentation

## 2022-02-15 HISTORY — PX: CARPAL TUNNEL RELEASE: SHX101

## 2022-02-15 LAB — GLUCOSE, CAPILLARY
Glucose-Capillary: 134 mg/dL — ABNORMAL HIGH (ref 70–99)
Glucose-Capillary: 160 mg/dL — ABNORMAL HIGH (ref 70–99)

## 2022-02-15 SURGERY — CARPAL TUNNEL RELEASE
Anesthesia: General | Site: Hand | Laterality: Right

## 2022-02-15 MED ORDER — CEFAZOLIN SODIUM-DEXTROSE 2-4 GM/100ML-% IV SOLN
INTRAVENOUS | Status: AC
  Filled 2022-02-15: qty 100

## 2022-02-15 MED ORDER — SODIUM CHLORIDE 0.9 % IV SOLN: INTRAVENOUS | Status: DC

## 2022-02-15 MED ORDER — BUPIVACAINE HCL (PF) 0.5 % IJ SOLN
INTRAMUSCULAR | Status: AC
  Filled 2022-02-15: qty 30

## 2022-02-15 MED ORDER — CHLORHEXIDINE GLUCONATE CLOTH 2 % EX PADS: 6.0000 | MEDICATED_PAD | Freq: Once | CUTANEOUS | Status: DC

## 2022-02-15 MED ORDER — GABAPENTIN 400 MG PO CAPS: 400.0000 mg | ORAL_CAPSULE | Freq: Three times a day (TID) | ORAL | 3 refills | Status: AC

## 2022-02-15 MED ORDER — LIDOCAINE HCL (CARDIAC) PF 100 MG/5ML IV SOSY
PREFILLED_SYRINGE | INTRAVENOUS | Status: DC | PRN
  Administered 2022-02-15: 50 mg via INTRAVENOUS

## 2022-02-15 MED ORDER — GABAPENTIN 300 MG PO CAPS: 300.0000 mg | ORAL_CAPSULE | ORAL | Status: AC

## 2022-02-15 MED ORDER — PROPOFOL 10 MG/ML IV BOLUS
INTRAVENOUS | Status: DC | PRN
  Administered 2022-02-15: 120 mg via INTRAVENOUS
  Administered 2022-02-15: 50 mg via INTRAVENOUS

## 2022-02-15 MED ORDER — ACETAMINOPHEN 10 MG/ML IV SOLN
INTRAVENOUS | Status: DC | PRN
  Administered 2022-02-15: 1000 mg via INTRAVENOUS

## 2022-02-15 MED ORDER — GABAPENTIN 300 MG PO CAPS
ORAL_CAPSULE | ORAL | Status: AC
  Administered 2022-02-15: 300 mg via ORAL
  Filled 2022-02-15: qty 1

## 2022-02-15 MED ORDER — MELOXICAM 7.5 MG PO TABS
ORAL_TABLET | ORAL | Status: AC
  Administered 2022-02-15: 15 mg via ORAL
  Filled 2022-02-15: qty 2

## 2022-02-15 MED ORDER — MIDAZOLAM HCL 2 MG/2ML IJ SOLN
INTRAMUSCULAR | Status: AC
  Filled 2022-02-15: qty 2

## 2022-02-15 MED ORDER — CHLORHEXIDINE GLUCONATE 0.12 % MT SOLN
OROMUCOSAL | Status: AC
  Administered 2022-02-15: 15 mL via OROMUCOSAL
  Filled 2022-02-15: qty 15

## 2022-02-15 MED ORDER — EPHEDRINE SULFATE (PRESSORS) 50 MG/ML IJ SOLN
INTRAMUSCULAR | Status: DC | PRN
  Administered 2022-02-15: 20 mg via INTRAVENOUS
  Administered 2022-02-15: 10 mg via INTRAVENOUS
  Administered 2022-02-15: 20 mg via INTRAVENOUS

## 2022-02-15 MED ORDER — BUPIVACAINE HCL 0.5 % IJ SOLN
INTRAMUSCULAR | Status: DC | PRN
  Administered 2022-02-15: 20 mL

## 2022-02-15 MED ORDER — MELOXICAM 15 MG PO TABS: 15.0000 mg | ORAL_TABLET | Freq: Every day | ORAL | 3 refills | Status: AC

## 2022-02-15 MED ORDER — FENTANYL CITRATE (PF) 100 MCG/2ML IJ SOLN
INTRAMUSCULAR | Status: DC | PRN
  Administered 2022-02-15 (×2): 25 ug via INTRAVENOUS
  Administered 2022-02-15: 50 ug via INTRAVENOUS

## 2022-02-15 MED ORDER — ONDANSETRON HCL 4 MG/2ML IJ SOLN
INTRAMUSCULAR | Status: DC | PRN
  Administered 2022-02-15: 4 mg via INTRAVENOUS

## 2022-02-15 MED ORDER — CHLORHEXIDINE GLUCONATE 0.12 % MT SOLN: 15.0000 mL | Freq: Once | OROMUCOSAL | Status: AC

## 2022-02-15 MED ORDER — HYDROCODONE-ACETAMINOPHEN 5-325 MG PO TABS: 1.0000 | ORAL_TABLET | Freq: Four times a day (QID) | ORAL | 0 refills | Status: DC | PRN

## 2022-02-15 MED ORDER — FENTANYL CITRATE (PF) 100 MCG/2ML IJ SOLN
INTRAMUSCULAR | Status: AC
  Filled 2022-02-15: qty 2

## 2022-02-15 MED ORDER — DEXAMETHASONE SODIUM PHOSPHATE 10 MG/ML IJ SOLN
INTRAMUSCULAR | Status: DC | PRN
  Administered 2022-02-15: 5 mg via INTRAVENOUS

## 2022-02-15 MED ORDER — ORAL CARE MOUTH RINSE: 15.0000 mL | Freq: Once | OROMUCOSAL | Status: AC

## 2022-02-15 MED ORDER — VASOPRESSIN 20 UNIT/ML IV SOLN
INTRAVENOUS | Status: DC | PRN
  Administered 2022-02-15: .2 [IU] via INTRAVENOUS
  Administered 2022-02-15 (×2): .4 [IU] via INTRAVENOUS
  Administered 2022-02-15: .2 [IU] via INTRAVENOUS
  Administered 2022-02-15 (×3): .4 [IU] via INTRAVENOUS

## 2022-02-15 MED ORDER — CEFAZOLIN SODIUM-DEXTROSE 2-4 GM/100ML-% IV SOLN
2.0000 g | INTRAVENOUS | Status: AC
  Administered 2022-02-15: 2 g via INTRAVENOUS

## 2022-02-15 MED ORDER — PHENYLEPHRINE HCL (PRESSORS) 10 MG/ML IV SOLN
INTRAVENOUS | Status: DC | PRN
  Administered 2022-02-15 (×2): 80 ug via INTRAVENOUS

## 2022-02-15 MED ORDER — 0.9 % SODIUM CHLORIDE (POUR BTL) OPTIME
TOPICAL | Status: DC | PRN
  Administered 2022-02-15: 500 mL

## 2022-02-15 MED ORDER — MELOXICAM 7.5 MG PO TABS: 15.0000 mg | ORAL_TABLET | ORAL | Status: AC

## 2022-02-15 MED ORDER — PROPOFOL 10 MG/ML IV BOLUS
INTRAVENOUS | Status: AC
  Filled 2022-02-15: qty 20

## 2022-02-15 SURGICAL SUPPLY — 30 items
APL PRP STRL LF DISP 70% ISPRP (MISCELLANEOUS) ×1
BLADE SURG MINI STRL (BLADE) ×1 IMPLANT
BNDG ESMARK 4X12 TAN STRL LF (GAUZE/BANDAGES/DRESSINGS) ×1 IMPLANT
CHLORAPREP W/TINT 26 (MISCELLANEOUS) ×1 IMPLANT
CUFF TOURN SGL QUICK 18X4 (TOURNIQUET CUFF) IMPLANT
DRSG GAUZE FLUFF 36X18 (GAUZE/BANDAGES/DRESSINGS) ×2 IMPLANT
ELECT REM PT RETURN 9FT ADLT (ELECTROSURGICAL) ×1
ELECTRODE REM PT RTRN 9FT ADLT (ELECTROSURGICAL) ×1 IMPLANT
GAUZE XEROFORM 1X8 LF (GAUZE/BANDAGES/DRESSINGS) ×1 IMPLANT
GLOVE BIO SURGEON STRL SZ8 (GLOVE) ×1 IMPLANT
GOWN STRL REUS W/ TWL LRG LVL3 (GOWN DISPOSABLE) ×1 IMPLANT
GOWN STRL REUS W/TWL LRG LVL3 (GOWN DISPOSABLE) ×1
GOWN STRL REUS W/TWL LRG LVL4 (GOWN DISPOSABLE) ×1 IMPLANT
KIT TURNOVER KIT A (KITS) ×1 IMPLANT
MANIFOLD NEPTUNE II (INSTRUMENTS) ×1 IMPLANT
NS IRRIG 500ML POUR BTL (IV SOLUTION) ×1 IMPLANT
PACK EXTREMITY ARMC (MISCELLANEOUS) ×1 IMPLANT
PAD PREP 24X41 OB/GYN DISP (PERSONAL CARE ITEMS) ×1 IMPLANT
PADDING CAST BLEND 4X4 STRL (MISCELLANEOUS) ×1 IMPLANT
SPLINT CAST 1 STEP 3X12 (MISCELLANEOUS) ×1 IMPLANT
STOCKINETTE 48X4 2 PLY STRL (GAUZE/BANDAGES/DRESSINGS) ×1 IMPLANT
STOCKINETTE BIAS CUT 4 980044 (GAUZE/BANDAGES/DRESSINGS) ×1 IMPLANT
STOCKINETTE STRL 4IN 9604848 (GAUZE/BANDAGES/DRESSINGS) ×1 IMPLANT
STOCKINETTE STRL 6IN 960660 (GAUZE/BANDAGES/DRESSINGS) ×1 IMPLANT
SUT ETHILON 4-0 (SUTURE) ×1
SUT ETHILON 4-0 FS2 18XMFL BLK (SUTURE) ×1
SUT ETHILON 5-0 FS-2 18 BLK (SUTURE) ×1 IMPLANT
SUTURE ETHLN 4-0 FS2 18XMF BLK (SUTURE) ×1 IMPLANT
TRAP FLUID SMOKE EVACUATOR (MISCELLANEOUS) ×1 IMPLANT
WATER STERILE IRR 500ML POUR (IV SOLUTION) ×1 IMPLANT

## 2022-02-15 NOTE — Anesthesia Procedure Notes (Signed)
Procedure Name: LMA Insertion Date/Time: 02/15/2022 7:54 AM  Performed by: Biagio Borg, CRNAPre-anesthesia Checklist: Patient identified, Emergency Drugs available, Suction available and Patient being monitored Patient Re-evaluated:Patient Re-evaluated prior to induction Oxygen Delivery Method: Circle system utilized Preoxygenation: Pre-oxygenation with 100% oxygen Induction Type: IV induction Ventilation: Mask ventilation without difficulty LMA: LMA inserted LMA Size: 4.0 Tube type: Oral Number of attempts: 1 Placement Confirmation: positive ETCO2 and breath sounds checked- equal and bilateral Tube secured with: Tape Dental Injury: Teeth and Oropharynx as per pre-operative assessment

## 2022-02-15 NOTE — Discharge Instructions (Signed)
AMBULATORY SURGERY  ?DISCHARGE INSTRUCTIONS ? ? ?The drugs that you were given will stay in your system until tomorrow so for the next 24 hours you should not: ? ?Drive an automobile ?Make any legal decisions ?Drink any alcoholic beverage ? ? ?You may resume regular meals tomorrow.  Today it is better to start with liquids and gradually work up to solid foods. ? ?You may eat anything you prefer, but it is better to start with liquids, then soup and crackers, and gradually work up to solid foods. ? ? ?Please notify your doctor immediately if you have any unusual bleeding, trouble breathing, redness and pain at the surgery site, drainage, fever, or pain not relieved by medication. ? ? ? ?Additional Instructions: ? ? ? ?Please contact your physician with any problems or Same Day Surgery at [phone], Monday through Friday 6 am to 4 pm, or [place] at [facility] Main number at [phone].  ?

## 2022-02-15 NOTE — Op Note (Signed)
02/15/2022  8:45 AM  PATIENT:  Joshua Route Sr.    PRE-OPERATIVE DIAGNOSIS: RIGHT CARPAL TUNNEL SYNDROME POST-OPERATIVE DIAGNOSIS: RIGHT CARPAL TUNNEL SYNDROME  PROCEDURE:  RIGHT CARPAL TUNNEL RELEASE  SURGEON: Park Breed, MD    ANESTHESIA:   General  TOURNIQUET TIME: 67   MIN  PREOPERATIVE INDICATIONS:  Joshua CHALK Sr. is a  66 y.o. male with a diagnosis of right carpal tunnel syndrome who failed conservative measures and elected for surgical management.    The risks benefits and alternatives were discussed with the patient preoperatively including but not limited to the risks of infection, bleeding, nerve injury, incomplete relief of symptoms, pillar pain, cardiopulmonary complications, the need for revision surgery, among others, and the patient was willing to proceed.  OPERATIVE FINDINGS: Thickened volar ligament and nerve compression.  OPERATIVE PROCEDURE: The patient is brought to the operating room placed in the supine position. General anesthesia was administered. The right upper extremity was prepped and draped in usual sterile fashion. Time out was performed. The arm was elevated and exsanguinated and the tourniquet was inflated. Incision was made in line with the radial border of the ring finger. The carpal tunnel transverse fascia was identified, cleaned, and incised sharply. The common sensory branches were visualized along with the superficial palmar arch and protected.  The median nerve was protected below  A Kelly clamp was  placed underneath the transverse carpal ligament, protecting the nerve. I released the ligament completely, and then released the proximal distal volar forearm fascia. The nerve was identified, and visualized, and protected throughout the case. The motor branch was intact upon inspection.  No masses or abnormalities were identified in ulnar bursa.  The wounds were irrigated copiously, and the skin closed with nylon. The wound was injected  with 1/2% marcaine followed by a sterile dressing and  volar splint .  Tourniquet was deflated with good return of blood flow to all fingers. Sponge and needle counts were correct.  The patient tolerated this well, with no complications. The patient was awakened and taken to recovery in good condition.

## 2022-02-15 NOTE — Transfer of Care (Signed)
Immediate Anesthesia Transfer of Care Note  Patient: Joshua Route Sr.  Procedure(s) Performed: CARPAL TUNNEL RELEASE (Right: Hand)  Patient Location: PACU  Anesthesia Type:General  Level of Consciousness: drowsy and patient cooperative  Airway & Oxygen Therapy: Patient Spontanous Breathing  Post-op Assessment: Report given to RN and Post -op Vital signs reviewed and stable  Post vital signs: Reviewed and stable  Last Vitals:  Vitals Value Taken Time  BP 98/64 02/15/22 0849  Temp 36.3 C 02/15/22 0849  Pulse 52 02/15/22 0858  Resp 12 02/15/22 0858  SpO2 89 % 02/15/22 0858  Vitals shown include unvalidated device data.  Last Pain:  Vitals:   02/15/22 0849  TempSrc:   PainSc: Asleep         Complications: No notable events documented.

## 2022-02-15 NOTE — H&P (Signed)
THE PATIENT WAS SEEN PRIOR TO SURGERY TODAY.  HISTORY, ALLERGIES, HOME MEDICATIONS AND OPERATIVE PROCEDURE WERE REVIEWED. RISKS AND BENEFITS OF SURGERY DISCUSSED WITH PATIENT AGAIN.  NO CHANGES FROM INITIAL HISTORY AND PHYSICAL NOTED.    

## 2022-02-15 NOTE — Anesthesia Preprocedure Evaluation (Signed)
Anesthesia Evaluation  Patient identified by MRN, date of birth, ID band Patient awake    Reviewed: Allergy & Precautions, H&P , NPO status , Patient's Chart, lab work & pertinent test results, reviewed documented beta blocker date and time   History of Anesthesia Complications Negative for: history of anesthetic complications  Airway Mallampati: III  TM Distance: >3 FB Neck ROM: full    Dental  (+) Dental Advidsory Given, Caps, Poor Dentition, Missing   Pulmonary neg shortness of breath, sleep apnea and Continuous Positive Airway Pressure Ventilation , neg COPD, neg recent URI, Current Smoker and Patient abstained from smoking.,    Pulmonary exam normal breath sounds clear to auscultation       Cardiovascular Exercise Tolerance: Good hypertension, (-) angina+ CAD, + Cardiac Stents, + Peripheral Vascular Disease and + DOE  (-) Past MI and (-) CABG (-) dysrhythmias (-) Valvular Problems/Murmurs Rhythm:regular Rate:Normal     Neuro/Psych PSYCHIATRIC DISORDERS Anxiety Depression negative neurological ROS     GI/Hepatic Neg liver ROS, GERD  ,  Endo/Other  diabetes  Renal/GU negative Renal ROS  negative genitourinary   Musculoskeletal   Abdominal   Peds  Hematology negative hematology ROS (+)   Anesthesia Other Findings Past Medical History: 1995: A-fib ([place]) 06/12/2013: Actinic keratosis     Comment:  Anterior chest. No date: Adenomatous polyps No date: Anxiety     Comment:  a.) on BZO (diazepam) PRN 01/15/2011: CAD (coronary artery disease)     Comment:  a.) LHC 01/15/2011: EF 60%; 40% mLAD, 40% OM1 - med               mgmt; b.) cCTA 09/2012: Calcium score 400; c.) cCTA               08/16/2014: Calcium score 1003.3; d.) LHC 03/24/2021               [facility]): EF 60%; 80% pRCA, 100 mLAD --> Recommended               consult at [facility] for high risk PCI; e.) PCI 05/03/2021               [facility]): CTO mLAD stented with 2.5 x  32 mm Synergy DES               (mLAD) and 2.5 x 28 mm Synergy DES (m-dLAD) No date: Carpal tunnel syndrome No date: Chronic back pain No date: Chronic knee pain 79/39/0300: Diastolic dysfunction     Comment:  a.)  TTE 03/24/2019: EF 50-55%, mild-mod LVH, mod-sev               LAE, mild MR/TR/PR, G1DD No date: Diverticulosis No date: DOE (dyspnea on exertion) No date: Erectile dysfunction     Comment:  a.) on PDE5i (sildenafil) No date: GERD (gastroesophageal reflux disease) No date: History of kidney stones No date: History of marijuana use No date: Hyperlipidemia No date: Hyperprolactinemia (HCC) No date: Hypertension No date: Hypogonadism in male     Comment:  a.) on exogenous TRT (depotestosterone cypionate)               injections No date: Insomnia     Comment:  a.) on hypnotic (zolpidem) No date: Opioid use disorder     Comment:  a.) on buprenorphine; b.) has naloxone prescribed No date: OSA on CPAP No date: Palpitations No date: 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 SCCis, hypertrophic 07/03/2018: Squamous cell carcinoma of skin     Comment:  Right posterior aspect midline scalp. WD SCC,               acantholytic (adenoid) variant. 07/03/2018: Squamous cell carcinoma of skin     Comment:  Left posterior midline scalp. WD SCC, acantholytic               (adenoid) variant. No date: Type 2 diabetes mellitus treated with insulin (HCC)     Comment:  a.) uses Freestyle Libre CGM   Reproductive/Obstetrics negative OB ROS                             Anesthesia Physical  Anesthesia Plan  ASA: 3  Anesthesia Plan: General   Post-op Pain Management:    Induction: Intravenous  PONV Risk Score and Plan: 1 and Ondansetron, Dexamethasone and Treatment may vary due to  age or medical condition  Airway Management Planned: LMA  Additional Equipment:   Intra-op Plan:   Post-operative Plan: Extubation in OR  Informed Consent: I have reviewed the patients History and Physical, chart, labs and discussed the procedure including the risks, benefits and alternatives for the proposed anesthesia with the patient or authorized representative who has indicated his/her understanding and acceptance.     Dental Advisory Given  Plan Discussed with: Anesthesiologist, CRNA and Surgeon  Anesthesia Plan Comments: (Patient consented for risks of anesthesia including but not limited to:  - adverse reactions to medications - damage to eyes, teeth, lips or other oral mucosa - nerve damage due to positioning  - sore throat or hoarseness - Damage to heart, brain, nerves, lungs, other parts of body or loss of life  Patient voiced understanding.)        Anesthesia Quick Evaluation

## 2022-02-16 DIAGNOSIS — F1190 Opioid use, unspecified, uncomplicated: Principal | ICD-10-CM

## 2022-02-16 MED ORDER — BUPRENORPHINE HCL 8 MG SUBLINGUAL TABLET
ORAL_TABLET | 0 refills | 3 days | Status: CP
Start: 2022-02-16 — End: 2022-02-19

## 2022-02-16 NOTE — Anesthesia Postprocedure Evaluation (Signed)
Anesthesia Post Note  Patient: Joshua Route Sr.  Procedure(s) Performed: CARPAL TUNNEL RELEASE (Right: Hand)  Patient location during evaluation: PACU Anesthesia Type: General Level of consciousness: awake and alert Pain management: pain level controlled Vital Signs Assessment: post-procedure vital signs reviewed and stable Respiratory status: spontaneous breathing, nonlabored ventilation, respiratory function stable and patient connected to nasal cannula oxygen Cardiovascular status: blood pressure returned to baseline and stable Postop Assessment: no apparent nausea or vomiting Anesthetic complications: no   No notable events documented.   Last Vitals:  Vitals:   02/15/22 0915 02/15/22 0936  BP: 105/63 102/72  Pulse: (!) 54 (!) 58  Resp: 11 16  Temp: (!) 36.3 C (!) 36.1 C  SpO2: 96% 93%    Last Pain:  Vitals:   02/15/22 0936  TempSrc: Temporal  PainSc: 0-No pain                 Dimas Millin
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
## 2022-02-18 MED ORDER — BUPRENORPHINE HCL 8 MG SUBLINGUAL TABLET
ORAL_TABLET | 2 refills | 0 days
Start: 2022-02-18 — End: 2022-05-19

## 2022-02-18 MED ORDER — NALOXONE 4 MG/ACTUATION NASAL SPRAY
0 refills | 0 days
Start: 2022-02-18 — End: ?

## 2022-02-19 ENCOUNTER — Ambulatory Visit: Admit: 2022-02-19 | Discharge: 2022-02-20 | Payer: PRIVATE HEALTH INSURANCE

## 2022-02-19 DIAGNOSIS — R110 Nausea: Principal | ICD-10-CM

## 2022-02-19 DIAGNOSIS — Z79899 Other long term (current) drug therapy: Principal | ICD-10-CM

## 2022-02-19 DIAGNOSIS — E7800 Pure hypercholesterolemia, unspecified: Principal | ICD-10-CM

## 2022-02-19 DIAGNOSIS — F1190 Opioid use, unspecified, uncomplicated: Principal | ICD-10-CM

## 2022-02-19 DIAGNOSIS — Z23 Encounter for immunization: Principal | ICD-10-CM

## 2022-02-19 DIAGNOSIS — E669 Obesity, unspecified: Principal | ICD-10-CM

## 2022-02-19 LAB — TOXICOLOGY SCREEN, URINE
AMPHETAMINE SCREEN URINE: NEGATIVE
BARBITURATE SCREEN URINE: NEGATIVE
BENZODIAZEPINE SCREEN, URINE: NEGATIVE
BUPRENORPHINE, URINE SCREEN: POSITIVE — AB
CANNABINOID SCREEN URINE: NEGATIVE
COCAINE(METAB.)SCREEN, URINE: NEGATIVE
FENTANYL SCREEN, URINE: NEGATIVE
METHADONE SCREEN, URINE: NEGATIVE
OPIATE SCREEN URINE: NEGATIVE
OXYCODONE SCREEN URINE: NEGATIVE

## 2022-02-19 MED ORDER — BUPRENORPHINE HCL 8 MG SUBLINGUAL TABLET
ORAL_TABLET | 2 refills | 0 days | Status: CP
Start: 2022-02-19 — End: 2022-05-19

## 2022-02-19 MED ORDER — PHENTERMINE 37.5 MG TABLET
ORAL_TABLET | Freq: Every day | ORAL | 0 refills | 90 days | Status: CP
Start: 2022-02-19 — End: 2022-05-20

## 2022-02-19 MED ORDER — ONDANSETRON HCL 8 MG TABLET
ORAL_TABLET | Freq: Two times a day (BID) | ORAL | 5 refills | 30 days | Status: CP
Start: 2022-02-19 — End: 2023-02-19

## 2022-02-19 MED ORDER — NALOXONE 4 MG/ACTUATION NASAL SPRAY
0 refills | 0 days | Status: CP
Start: 2022-02-19 — End: ?

## 2022-02-19 NOTE — Unmapped (Addendum)
Today's BMI 35.02, with weight 237 lbs  Patient tolerating phentermine tablets well and is losing weight well, at a healthy rate. Of note, capsules do not suppress appetite so will stay w/tablets. He takes phentermine most days since every day dosing actually increases appetite. Patient recently started on Ozempic ~3wks ago by [name] (of [facility]) and is tolerating this well.  Continue ozempic 0.25 mg qwk as prescribed by [facility] and to follow-up with [facility] as scheduled   Discussed BP high at triage, improved on my recheck. Pt states home BPs are controlled. Reviewed chart, and BPs at 2 recent preop visits 02/15/22 and 01/11/22 were 102/72 and 114/68, and BP at Endo visit 12/13/21 was 124/80, so HTN seems well controlled overall.   Continue phentermine tablets 37.5 mg qAM before breakfast most days. Will continue to monitor BPs.  Of note, his cardiologist has approved phentermine use  I have encouraged weight loss via healthy diet and regular exercise  Wt Readings from Last 3 Encounters:   02/19/22 (!) 107.6 kg (237 lb 1.9 oz)   12/25/21 (!) 105.2 kg (232 lb)   12/06/21 (!) 121.1 kg (267 lb)

## 2022-02-19 NOTE — Unmapped (Addendum)
Hx of opioid pill abuse, starting after prescribed opioids for knee/shoulder surgeries.  Hx rare MJ use in social situations and regular CBD oil use, so expect UDS + cannabinoid. Stable on Subutex now x several yrs, pt wanting to gradually taper down. Overall well controlled on recent (6/23) decrease dose to 1 tab qAM and 1/2 tab qPM, currenlty pt does not want to taper down further.   Continue subutex 8 mg (1 tab) in am and 1/2 tab in evening. Pt will watch for withdrawal sxs/cravings and urged patient to contact me if this occurs and could return to 1 tab BID or perhaps 1 tab in am and 3/4 tab in pm.   Sent rx for updated narcan nasal spray   Controlled substance contract signed 02/19/22  UDS collected  PDMP today shows 30 d supply of buprenorphine last filled 11/20/21. Patient was prescribed a quantity of 50 norco on 02/15/22 from Dr. Deeann Saint in [place], [place], c/w the date of his carpal tunnel surgery (last dose 3d ago per pt, expect in UDS). Last fill date of testosterone was 12/19/21 from [facility]. Last fill date of zolpidem 10/23/21. Last fill date of phentermine 10/23/21. No inappropriate entries.  Plan f/u in 3 mos

## 2022-02-19 NOTE — Unmapped (Addendum)
Chronic nausea well controlled with zofran prn, continue this.  - Refilled zofran 8 mg BID prn

## 2022-02-19 NOTE — Unmapped (Addendum)
Managed by Dr. Adrian Blackwater of [facility] in  [place]. Patient currently on evolocumab 140 mg q14d and bempedoic acid. Pt stopped zetia ~3wks ago due to ?SE of rhinorrhea, unclear if related. Still on generic Vascepa as well now  - Continue regimen as prescribed by Dr. Welton Flakes and to follow-up with him as scheduled, records request placed for most recent lipid panel. LDL goal <70 given CAD  The ASCVD Risk score (Arnett DK, et al., 2019) failed to calculate.  Lab Results   Component Value Date    LDL 107 (H) 07/01/2020

## 2022-02-19 NOTE — Unmapped (Signed)
See Opioid use disorder tab

## 2022-02-21 LAB — OPIATE, URINE, QUANTITATIVE
6-MONOACETYLMRPH: <5 ng/mL (ref <5)
BUPRENORPHINE: 31 ng/mL — ABNORMAL HIGH (ref <5)
CODEINE GC/MS CONF: <25 ng/mL (ref <25)
FENTANYL, URINE GC/MS: <0.5 ng/mL (ref <0.5)
HYDROCODONE GC/MS CONF: <25 ng/mL (ref <25)
HYDROMORPHONE GC/MS CONF: <25 ng/mL (ref <25)
MORPHINE GC/MS CONF: <25 ng/mL (ref <25)
NORBUPRENORPHINE: 220 ng/mL — ABNORMAL HIGH (ref <5)
NORFENTANYL, UR GC/MS: <1 ng/mL (ref <1.0)
OPIATE INTERP: POSITIVE
OXYCODONE (GC/MS): <25 ng/mL (ref <25)
OXYMORPHONE: <25 ng/mL (ref <25)

## 2022-02-22 LAB — BENZODIAZEPINES, CONF, URINE
2-HYDROXY ETHYL FLURAZEPAM BY MS CONFIRM: NEGATIVE ng/mL
7-NH-FLUNITRAZEPAM-BY GC/MS: NEGATIVE ng/mL
ALPHA-HYDROXY MIDAZOLAM BY MS CONFIRM: NEGATIVE ng/mL
ALPRAZOLAM BY MS CONFIRM: NEGATIVE ng/mL
BENZODIAZEPINES INTERPRETATION: NEGATIVE
CHLORDIAZEPOXIDE BY MS CONFIRM: NEGATIVE ng/mL
CLOBAZAM BY MS CONFIRM: NEGATIVE ng/mL
CLONAZEPAM BY MS CONFIRM: NEGATIVE ng/mL
DIAZEPAM BY MS CONFIRM: NEGATIVE ng/mL
FLUNITRAZEPAM BY MS CONFIRM: NEGATIVE ng/mL
FLURAZEPAM BY MS CONFIRM: NEGATIVE ng/mL
MIDAZOLAM BY MS CONFIRM: NEGATIVE ng/mL
N-DESMETHYLCLOBAZAM BY MS CONFIRM: NEGATIVE ng/mL
PRAZEPAM BY MS CONFIRM: NEGATIVE ng/mL
TRIAZOLAM BY MS CONFIRM: NEGATIVE ng/mL
ZOLPIDEM BY MS CONFIRM: NEGATIVE ng/mL
ZOLPIDEM PHENYL-4-CARBOXYLIC ACID BY MS CONFIRM: NEGATIVE ng/mL

## 2022-02-23 NOTE — Unmapped (Signed)
Specialty Medication(s): Repatha    Mr.Stallings Sr has been dis-enrolled from the [facility] specialty pharmacy services due to  Patient declined due to increased copay .    Additional information provided to the patient: [facility] contact information    Camillo Flaming, PharmD  [facility] Specialty Pharmacist

## 2022-05-22 MED ORDER — BUPRENORPHINE HCL 8 MG SUBLINGUAL TABLET
ORAL_TABLET | 2 refills | 0 days
Start: 2022-05-22 — End: 2022-08-19

## 2022-05-23 ENCOUNTER — Ambulatory Visit: Admit: 2022-05-23 | Discharge: 2022-05-24 | Payer: PRIVATE HEALTH INSURANCE

## 2022-05-23 DIAGNOSIS — H8110 Benign paroxysmal vertigo, unspecified ear: Principal | ICD-10-CM

## 2022-05-23 DIAGNOSIS — E669 Obesity, unspecified: Principal | ICD-10-CM

## 2022-05-23 DIAGNOSIS — F1190 Opioid use, unspecified, uncomplicated: Principal | ICD-10-CM

## 2022-05-23 DIAGNOSIS — Z79899 Other long term (current) drug therapy: Principal | ICD-10-CM

## 2022-05-23 DIAGNOSIS — F411 Generalized anxiety disorder: Principal | ICD-10-CM

## 2022-05-23 DIAGNOSIS — E1142 Type 2 diabetes mellitus with diabetic polyneuropathy: Principal | ICD-10-CM

## 2022-05-23 MED ORDER — VENLAFAXINE ER 37.5 MG CAPSULE,EXTENDED RELEASE 24 HR
ORAL_CAPSULE | Freq: Every day | ORAL | 3 refills | 90 days | Status: CP
Start: 2022-05-23 — End: 2023-05-23

## 2022-05-23 MED ORDER — DIAZEPAM 5 MG TABLET
ORAL_TABLET | Freq: Every day | ORAL | 0 refills | 20 days | Status: CP | PRN
Start: 2022-05-23 — End: 2023-05-23

## 2022-05-23 MED ORDER — BUPRENORPHINE HCL 8 MG SUBLINGUAL TABLET
ORAL_TABLET | 2 refills | 0 days | Status: CP
Start: 2022-05-23 — End: 2022-08-19

## 2022-06-11 ENCOUNTER — Other Ambulatory Visit: Payer: Self-pay

## 2022-06-11 DIAGNOSIS — I25119 Atherosclerotic heart disease of native coronary artery with unspecified angina pectoris: Secondary | ICD-10-CM

## 2022-06-25 ENCOUNTER — Ambulatory Visit: Payer: PPO | Admitting: Cardiovascular Disease

## 2022-06-27 ENCOUNTER — Ambulatory Visit (INDEPENDENT_AMBULATORY_CARE_PROVIDER_SITE_OTHER): Payer: PPO

## 2022-06-27 DIAGNOSIS — I2510 Atherosclerotic heart disease of native coronary artery without angina pectoris: Secondary | ICD-10-CM | POA: Diagnosis not present

## 2022-06-27 DIAGNOSIS — I25119 Atherosclerotic heart disease of native coronary artery with unspecified angina pectoris: Secondary | ICD-10-CM

## 2022-06-27 MED ORDER — TECHNETIUM TC 99M SESTAMIBI GENERIC - CARDIOLITE
10.5000 | Freq: Once | INTRAVENOUS | Status: AC | PRN
  Administered 2022-06-27: 10.5 via INTRAVENOUS

## 2022-06-27 MED ORDER — TECHNETIUM TC 99M SESTAMIBI GENERIC - CARDIOLITE
31.5000 | Freq: Once | INTRAVENOUS | Status: AC | PRN
  Administered 2022-06-27: 31.5 via INTRAVENOUS

## 2022-07-02 ENCOUNTER — Encounter: Payer: Self-pay | Admitting: Cardiovascular Disease

## 2022-07-02 ENCOUNTER — Ambulatory Visit (INDEPENDENT_AMBULATORY_CARE_PROVIDER_SITE_OTHER): Payer: PPO | Admitting: Cardiovascular Disease

## 2022-07-02 VITALS — BP 120/70 | HR 88 | Ht 69.0 in | Wt 238.2 lb

## 2022-07-02 DIAGNOSIS — I2510 Atherosclerotic heart disease of native coronary artery without angina pectoris: Secondary | ICD-10-CM

## 2022-07-02 DIAGNOSIS — G4730 Sleep apnea, unspecified: Secondary | ICD-10-CM

## 2022-07-02 DIAGNOSIS — I10 Essential (primary) hypertension: Secondary | ICD-10-CM | POA: Diagnosis not present

## 2022-07-02 DIAGNOSIS — E11311 Type 2 diabetes mellitus with unspecified diabetic retinopathy with macular edema: Secondary | ICD-10-CM

## 2022-07-02 DIAGNOSIS — R0789 Other chest pain: Secondary | ICD-10-CM

## 2022-07-02 DIAGNOSIS — R072 Precordial pain: Secondary | ICD-10-CM

## 2022-07-02 MED ORDER — METOPROLOL TARTRATE 50 MG PO TABS: ORAL_TABLET | ORAL | 0 refills | Status: DC

## 2022-07-02 NOTE — Progress Notes (Signed)
Cardiology Office Note   Date:  07/02/2022   ID:  Joshua GROESBECK Sr., DOB [birth year], MRN [id number]  PCP:  Neomia Dear, MD  Cardiologist:  Neoma Laming, MD      History of Present Illness: Joshua POURCIAU Sr. is a 67 y.o. male who presents for  Chief Complaint  Patient presents with   Follow-up    4 mo fu with NST results    Chest Pain  Chronicity: mostly left arm/shoulder pain. The current episode started more than 1 month ago. The onset quality is gradual. The problem has been waxing and waning. The pain is present in the lateral region. The pain is at a severity of 3/10. The pain is mild. The quality of the pain is described as dull.      Past Medical History:  Diagnosis Date   A-fib ([place]) 1995   Actinic keratosis 06/12/2013   Anterior chest.   Adenomatous polyps    Anxiety    a.) on BZO (diazepam) PRN   Arthritis    CAD (coronary artery disease) 01/15/2011   a.) LHC 01/15/2011: EF 60%; 40% mLAD, 40% OM1 - med mgmt; b.) cCTA 09/2012: Calcium score 400; c.) cCTA 08/16/2014: Calcium score 1003.3; d.) LHC 03/24/2021 [facility]): EF 60%; 80% pRCA, 100 mLAD --> Recommended consult at [facility] for high risk PCI; e.) PCI 05/03/2021 [facility]): CTO mLAD stented with 2.5 x 32 mm Synergy DES (mLAD) and 2.5 x 28 mm Synergy DES (m-dLAD)   Carpal tunnel syndrome    Chronic back pain    Chronic knee pain    Diastolic dysfunction [id number]   a.)  TTE 03/24/2019: EF 50-55%, mild-mod LVH, mod-sev LAE, mild MR/TR/PR, G1DD   Diverticulosis    DOE (dyspnea on exertion)    Erectile dysfunction    a.) on PDE5i (sildenafil)   GERD (gastroesophageal reflux disease)    History of kidney stones    History of marijuana use    Hyperlipidemia    Hyperprolactinemia (HCC)    Hypertension    Hypogonadism in male    a.) on exogenous TRT (depotestosterone cypionate) injections   Insomnia    a.) on hypnotic (zolpidem)   Opioid use disorder    a.) on buprenorphine; b.) has naloxone prescribed    OSA on CPAP    Palpitations    Proliferative diabetic retinopathy of both eyes (HCC)    PVD (peripheral vascular disease) ([place])    RBBB (right bundle branch block)    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 WD SCC, acantholytic (adenoid) variant.   Squamous cell carcinoma of skin 07/03/2018   Left posterior midline scalp. WD SCC, acantholytic (adenoid) variant.   Type 2 diabetes mellitus treated with insulin ([place])    a.) uses Freestyle Libre CGM     Past Surgical History:  Procedure Laterality Date   APPENDECTOMY     AQUEOUS SHUNT Left 07/16/2016   Procedure: AQUEOUS SHUNT  left  diabetic;  Surgeon: Ronnell Freshwater, MD;  Location: [facility];  Service: Ophthalmology;  Laterality: Left;  ahmed tube shunt diabetic - insulin sleep apnea   CARPAL TUNNEL RELEASE Left 01/11/2022   Procedure: CARPAL TUNNEL RELEASE;  Surgeon: Earnestine Leys, MD;  Location: [facility];  Service: Orthopedics;  Laterality: Left;   CARPAL TUNNEL RELEASE Right 02/15/2022   Procedure: CARPAL TUNNEL RELEASE;  Surgeon: Earnestine Leys, MD;  Location: [facility];  Service: Orthopedics;  Laterality: Right;   CORONARY ANGIOPLASTY WITH STENT PLACEMENT Left 05/03/2021   Procedure: CORONARY ANGIOPLASTY WITH STENT PLACEMENT; Location: [facility]   EYE SURGERY     cataract to both   KNEE ARTHROSCOPY Right    LEFT HEART CATH AND CORONARY ANGIOGRAPHY Left 01/15/2011   Procedure: LEFT HEART CATH AND CORONARY ANGIOGRAPHY; Location: [facility]; Surgeon: Neoma Laming, MD   LEFT HEART CATH AND CORONARY ANGIOGRAPHY N/A 03/24/2021   Procedure: LEFT HEART CATH AND CORONARY ANGIOGRAPHY with intervention;  Surgeon: Dionisio David, MD;  Location: [facility];  Service: Cardiovascular;  Laterality: N/A;   left toe removed Left    2nd toe   ROTATOR CUFF REPAIR Right 2015   TONSILLECTOMY        Current Outpatient Medications  Medication Sig Dispense Refill   acetaminophen (TYLENOL) 500 MG tablet Take 1,000 mg by mouth every 8 (eight) hours as needed.     amLODipine (NORVASC) 10 MG tablet Take 10 mg by mouth daily.     aspirin EC 81 MG tablet Take 81 mg by mouth at bedtime.     B-D 3CC LUER-LOK SYR 21GX1" 21G X 1" 3 ML MISC USE TO INJECT 1 ML OF TESTOSTERONE INTO MUSCLE  12   BD HYPODERMIC NEEDLE 18G X 1" MISC USE AS DIRECTED FOR WEEKLY INJECTIONS  1   BD ULTRA-FINE PEN NEEDLES 29G X 12.7MM MISC USE 2 (TWO) TIMES DAILY.  3   buprenorphine (SUBUTEX) 8 MG SUBL SL tablet Place 4-8 mg under the tongue 2 (two) times daily as needed (pain). Take 8 mg by mouth in the morning and 4 mg in the evening as needed for pain     cetirizine (ZYRTEC) 10 MG tablet Take 10 mg by mouth daily.     clopidogrel (PLAVIX) 75 MG tablet Take 75 mg by mouth daily.     Coenzyme Q10 (CO Q-10) 100 MG CAPS Take 100 mg by mouth in the morning and at bedtime.     Continuous Blood Gluc Receiver (FREESTYLE LIBRE READER) DEVI USE 1 EACH ONCE DAILY.  1   Continuous Blood Gluc Sensor (FREESTYLE LIBRE SENSOR SYSTEM) MISC USE 3 EACH EVERY 10 (TEN) DAYS.  11   diazepam (VALIUM) 5 MG tablet Take 2.5 mg by mouth daily as needed for anxiety.     Evolocumab (REPATHA SURECLICK) [id number] MG/ML SOAJ Inject into the skin every 14 (fourteen) days. Sundays     ezetimibe (ZETIA) 10 MG tablet TAKE ONE TABLET EVERY DAY (Patient taking differently: Take 10 mg by mouth at bedtime.) 90 tablet 0   gabapentin (NEURONTIN) 400 MG capsule Take 1 capsule (400 mg total) by mouth 3 (three) times daily. 60 capsule 3   gentamicin cream (GARAMYCIN) 0.1 % Apply 1 application topically 2 (two) times daily. (Patient taking differently: Apply 1 application  topically daily as needed (irritation).) 15 g 1   insulin aspart (NOVOLOG) 100 UNIT/ML FlexPen Inject 2-6 Units into the skin 3 (three) times daily as needed (blood sugar of 250 or above).      lisinopril-hydrochlorothiazide (ZESTORETIC) 20-25 MG tablet Take 1 tablet by mouth daily.     meloxicam (MOBIC) 15 MG tablet Take 1 tablet (15 mg total) by mouth daily. 30 tablet 3   metFORMIN (GLUCOPHAGE) 1000 MG tablet Take 1,000 mg by mouth 2 (two) times daily with a meal.     metoprolol tartrate (LOPRESSOR) 50 MG tablet Take 2 night before and 2 tab 90  miutes prior to ccta 6 tablet 0   Multiple Vitamins-Minerals (PRESERVISION AREDS 2 PO) Take 2 tablets by mouth in the morning and at bedtime.     mupirocin ointment (BACTROBAN) 2 % Apply 1 application. topically daily. Apply to any cuts, scrapes, wounds at body daily until healed. 30 g 11   NARCAN 4 MG/0.1ML LIQD nasal spray kit Place 0.4 mg into the nose once.     ondansetron (ZOFRAN) 8 MG tablet Take 8 mg by mouth every 8 (eight) hours as needed.     pantoprazole (PROTONIX) 40 MG tablet TAKE ONE TABLET EVERY DAY 90 tablet 1   Semaglutide, 1 MG/DOSE, 4 MG/3ML SOPN Inject 1 mg into the skin once a week.     testosterone cypionate (DEPOTESTOSTERONE CYPIONATE) 200 MG/ML injection Inject 200 mg into the muscle every 14 (fourteen) days.      timolol (TIMOPTIC) 0.5 % ophthalmic solution Place 1 drop into the left eye 2 (two) times daily.   3   TOUJEO SOLOSTAR 300 UNIT/ML Solostar Pen Inject 48 Units into the skin at bedtime.     venlafaxine XR (EFFEXOR-XR) 37.5 MG 24 hr capsule Take 1 capsule (37.5 mg total) by mouth daily. 90 capsule 1   zolpidem (AMBIEN) 10 MG tablet Take 10 mg by mouth at bedtime as needed for sleep.      gabapentin (NEURONTIN) 400 MG capsule Take 1 capsule (400 mg total) by mouth 3 (three) times daily. (Patient not taking: Reported on 07/02/2022) 60 capsule 3   HYDROcodone-acetaminophen (NORCO) 5-325 MG tablet Take 1-2 tablets by mouth every 6 (six) hours as needed. (Patient not taking: Reported on 07/02/2022) 50 tablet 0   meloxicam (MOBIC) 15 MG tablet Take 1 tablet (15 mg total) by mouth daily. (Patient not taking: Reported on  07/02/2022) 30 tablet 3   sildenafil (REVATIO) 20 MG tablet Take 1-5 tablets (20-100 mg total) by mouth as needed. (Patient not taking: Reported on 07/02/2022) 50 tablet 12   VASCEPA 1 g capsule Take 2 g by mouth 2 (two) times daily. (Patient not taking: Reported on 07/02/2022)     No current facility-administered medications for this visit.   Facility-Administered Medications Ordered in Other Visits  Medication Dose Route Frequency Provider Last Rate Last Admin   sodium chloride flush (NS) 0.9 % injection 3 mL  3 mL Intravenous Q12H Dionisio David, MD        Allergies:   Crestor [rosuvastatin], Lotensin [benazepril], Statins, and Zocor [simvastatin]    Social History:   reports that he has been smoking cigarettes. He has a 22.50 pack-year smoking history. He has never used smokeless tobacco. He reports current alcohol use. He reports current drug use. Drug: Marijuana.   Family History:  family history includes Alcohol abuse in his father; Heart disease in his father and mother.    ROS:     Review of Systems  Constitutional: Negative.   HENT: Negative.    Eyes: Negative.   Respiratory: Negative.    Cardiovascular:  Positive for chest pain.  Gastrointestinal: Negative.   Genitourinary: Negative.   Musculoskeletal: Negative.   Skin: Negative.   Neurological: Negative.   Endo/Heme/Allergies: Negative.   Psychiatric/Behavioral: Negative.    All other systems reviewed and are negative.     All other systems are reviewed and negative.    PHYSICAL EXAM: VS:  BP 120/70   Pulse 88   Ht '5\' 9"'$  (1.753 m)   Wt 238 lb 3.2 oz (108 kg)  SpO2 96%   BMI 35.18 kg/m  , BMI Body mass index is 35.18 kg/m. Last weight:  Wt Readings from Last 3 Encounters:  07/02/22 238 lb 3.2 oz (108 kg)  02/15/22 242 lb (109.8 kg)  01/11/22 240 lb (108.9 kg)     Physical Exam Vitals reviewed.  Constitutional:      Appearance: Normal appearance. He is normal weight.  HENT:     Head:  Normocephalic.     Nose: Nose normal.     Mouth/Throat:     Mouth: Mucous membranes are moist.  Eyes:     Pupils: Pupils are equal, round, and reactive to light.  Cardiovascular:     Rate and Rhythm: Normal rate and regular rhythm.     Pulses: Normal pulses.     Heart sounds: Normal heart sounds.  Pulmonary:     Effort: Pulmonary effort is normal.  Abdominal:     General: Abdomen is flat. Bowel sounds are normal.  Musculoskeletal:        General: Normal range of motion.     Cervical back: Normal range of motion.  Skin:    General: Skin is warm.  Neurological:     General: No focal deficit present.     Mental Status: He is alert.  Psychiatric:        Mood and Affect: Mood normal.       EKG:   Recent Labs: No results found for requested labs within last 365 days.    Lipid Panel    Component Value Date/Time   CHOL 142 09/01/2019 1012   CHOL 172 02/03/2018 0923   TRIG 117 09/01/2019 1012   TRIG 297 (H) 02/03/2018 0923   HDL 36 (L) 09/01/2019 1012   CHOLHDL 5.3 (H) 05/21/2017 1146   VLDL 59 (H) 02/03/2018 0923   LDLCALC 85 09/01/2019 1012      Other studies Reviewed: Additional studies/ records that were reviewed today include:  Review of the above records demonstrates:       No data to display            ASSESSMENT AND PLAN:    ICD-10-CM   1. Other chest pain  R07.89 CT CORONARY MORPH W/CTA COR W/SCORE W/CA W/CM &/OR WO/CM    Basic Metabolic Panel (BMET)   has inntermittant chest pain and stress test shows ischaemia in LAD territory with apical defect, advise ccta    2. Coronary artery disease involving native coronary artery of native heart without angina pectoris  I25.10 CT CORONARY MORPH W/CTA COR W/SCORE W/CA W/CM &/OR WO/CM    Basic Metabolic Panel (BMET)    3. Essential hypertension  I10 CT CORONARY MORPH W/CTA COR W/SCORE W/CA W/CM &/OR WO/CM    Basic Metabolic Panel (BMET)    4. Sleep apnea, unspecified type  G47.30 CT CORONARY MORPH W/CTA  COR W/SCORE W/CA W/CM &/OR WO/CM    Basic Metabolic Panel (BMET)    5. 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 Precordial pain  R07.2 CT CORONARY MORPH W/CTA COR W/SCORE W/CA W/CM &/OR WO/CM    Basic Metabolic Panel (BMET)       Problem List Items Addressed This Visit       Cardiovascular and Mediastinum   Essential hypertension   Relevant  Medications   metoprolol tartrate (LOPRESSOR) 50 MG tablet   Other Relevant Orders   CT CORONARY MORPH W/CTA COR W/SCORE W/CA W/CM &/OR WO/CM   Basic Metabolic Panel (BMET)   CAD (coronary artery disease)   Relevant Medications   metoprolol tartrate (LOPRESSOR) 50 MG tablet   Other Relevant Orders   CT CORONARY MORPH W/CTA COR W/SCORE W/CA W/CM &/OR WO/CM   Basic Metabolic Panel (BMET)     Respiratory   Sleep apnea   Relevant Orders   CT CORONARY MORPH W/CTA COR W/SCORE W/CA W/CM &/OR WO/CM   Basic Metabolic Panel (BMET)     Endocrine   Diabetes mellitus type 2 with retinopathy ([place])   Relevant Medications   Semaglutide, 1 MG/DOSE, 4 MG/3ML SOPN   Other Relevant Orders   CT CORONARY MORPH W/CTA COR W/SCORE W/CA W/CM &/OR WO/CM   Basic Metabolic Panel (BMET)     Other   Chest pain - Primary   Relevant Orders   CT CORONARY MORPH W/CTA COR W/SCORE W/CA W/CM &/OR WO/CM   Basic Metabolic Panel (BMET)   CT CORONARY MORPH W/CTA COR W/SCORE W/CA W/CM &/OR WO/CM   Basic Metabolic Panel (BMET)       Disposition:   Return in about 4 weeks (around 07/30/2022) for after cc ta.    Total time spent: 30 minutes  Signed,  Neoma Laming, MD  07/02/2022 12:24 [place]

## 2022-07-03 LAB — BASIC METABOLIC PANEL
BUN/Creatinine Ratio: 21 (ref 10–24)
BUN: 23 mg/dL (ref 8–27)
CO2: 25 mmol/L (ref 20–29)
Calcium: 9.9 mg/dL (ref 8.6–10.2)
Chloride: 96 mmol/L (ref 96–106)
Creatinine, Ser: 1.07 mg/dL (ref 0.76–1.27)
Glucose: 205 mg/dL — ABNORMAL HIGH (ref 70–99)
Potassium: 4.5 mmol/L (ref 3.5–5.2)
Sodium: 137 mmol/L (ref 134–144)
eGFR: 76 mL/min/{1.73_m2} (ref >59)

## 2022-07-09 ENCOUNTER — Other Ambulatory Visit: Payer: PPO

## 2022-07-13 ENCOUNTER — Other Ambulatory Visit: Payer: Self-pay

## 2022-07-13 MED ORDER — METOPROLOL TARTRATE 50 MG PO TABS: ORAL_TABLET | ORAL | 0 refills | Status: AC

## 2022-07-16 ENCOUNTER — Ambulatory Visit (INDEPENDENT_AMBULATORY_CARE_PROVIDER_SITE_OTHER): Payer: PPO

## 2022-07-16 DIAGNOSIS — R0789 Other chest pain: Secondary | ICD-10-CM

## 2022-07-16 DIAGNOSIS — I10 Essential (primary) hypertension: Secondary | ICD-10-CM

## 2022-07-16 DIAGNOSIS — R072 Precordial pain: Secondary | ICD-10-CM

## 2022-07-16 DIAGNOSIS — I2510 Atherosclerotic heart disease of native coronary artery without angina pectoris: Secondary | ICD-10-CM | POA: Diagnosis not present

## 2022-07-16 DIAGNOSIS — G4730 Sleep apnea, unspecified: Secondary | ICD-10-CM

## 2022-07-16 DIAGNOSIS — E11311 Type 2 diabetes mellitus with unspecified diabetic retinopathy with macular edema: Secondary | ICD-10-CM

## 2022-07-16 MED ORDER — IOHEXOL 350 MG/ML SOLN
100.0000 mL | Freq: Once | INTRAVENOUS | Status: AC | PRN
  Administered 2022-07-16: 100 mL via INTRAVENOUS

## 2022-07-17 ENCOUNTER — Other Ambulatory Visit: Payer: Self-pay

## 2022-07-17 DIAGNOSIS — K2100 Gastro-esophageal reflux disease with esophagitis, without bleeding: Secondary | ICD-10-CM

## 2022-07-17 MED ORDER — PANTOPRAZOLE SODIUM 40 MG PO TBEC: 40.0000 mg | DELAYED_RELEASE_TABLET | Freq: Every day | ORAL | 1 refills | Status: DC

## 2022-07-30 ENCOUNTER — Ambulatory Visit (INDEPENDENT_AMBULATORY_CARE_PROVIDER_SITE_OTHER): Payer: PPO | Admitting: Cardiovascular Disease

## 2022-07-30 ENCOUNTER — Encounter: Payer: Self-pay | Admitting: Cardiovascular Disease

## 2022-07-30 VITALS — BP 130/70 | HR 72 | Ht 69.0 in | Wt 244.0 lb

## 2022-07-30 DIAGNOSIS — G4733 Obstructive sleep apnea (adult) (pediatric): Secondary | ICD-10-CM

## 2022-07-30 DIAGNOSIS — F17210 Nicotine dependence, cigarettes, uncomplicated: Secondary | ICD-10-CM | POA: Diagnosis not present

## 2022-07-30 DIAGNOSIS — E782 Mixed hyperlipidemia: Secondary | ICD-10-CM

## 2022-07-30 DIAGNOSIS — I2510 Atherosclerotic heart disease of native coronary artery without angina pectoris: Secondary | ICD-10-CM

## 2022-07-30 MED ORDER — STUDY - EVOLVE-MI - EVOLOCUMAB (REPATHA) 140 MG/ML SQ INJECTION (PI-STUCKEY): 140.0000 mg | INJECTION | SUBCUTANEOUS | Status: DC

## 2022-07-30 MED ORDER — VASCEPA 1 G PO CAPS: 2.0000 g | ORAL_CAPSULE | Freq: Two times a day (BID) | ORAL | 3 refills | Status: DC

## 2022-07-30 NOTE — Progress Notes (Signed)
Cardiology Office Note   Date:  07/30/2022   ID:  Joshua Route Sr., DOB 07-01-1955, MRN [id number]  PCP:  Neomia Dear, MD  Cardiologist:  Neoma Laming, MD      History of Present Illness: Joshua VENIS Sr. is a 67 y.o. male who presents for  Chief Complaint  Patient presents with   Follow-up    4 week follow up    HPI    Past Medical History:  Diagnosis Date   A-fib ([place]) 1995   Actinic keratosis 06/12/2013   Anterior chest.   Adenomatous polyps    Anxiety    a.) on BZO (diazepam) PRN   Arthritis    CAD (coronary artery disease) 01/15/2011   a.) LHC 01/15/2011: EF 60%; 40% mLAD, 40% OM1 - med mgmt; b.) cCTA 09/2012: Calcium score 400; c.) cCTA 08/16/2014: Calcium score 1003.3; d.) [place] 03/24/2021 [facility]): EF 60%; 80% pRCA, 100 mLAD --> Recommended consult at [facility] for high risk PCI; e.) PCI 05/03/2021 [facility]): CTO mLAD stented with 2.5 x 32 mm Synergy DES (mLAD) and 2.5 x 28 mm Synergy DES (m-dLAD)   Carpal tunnel syndrome    Chronic back pain    Chronic knee pain    Diastolic dysfunction 123456   a.)  TTE 03/24/2019: EF 50-55%, mild-mod LVH, mod-sev LAE, mild MR/TR/PR, G1DD   Diverticulosis    DOE (dyspnea on exertion)    Erectile dysfunction    a.) on PDE5i (sildenafil)   GERD (gastroesophageal reflux disease)    History of kidney stones    History of marijuana use    Hyperlipidemia    Hyperprolactinemia ([place])    Hypertension    Hypogonadism in male    a.) on exogenous TRT (depotestosterone cypionate) injections   Insomnia    a.) on hypnotic (zolpidem)   Opioid use disorder    a.) on buprenorphine; b.) has naloxone prescribed   OSA on CPAP    Palpitations    Proliferative diabetic retinopathy of both eyes ([place])    PVD (peripheral vascular disease) ([place])    RBBB (right bundle branch block)    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 SCCis, hypertrophic   Squamous cell carcinoma of skin 07/03/2018   Right posterior  aspect midline scalp. WD SCC, acantholytic (adenoid) variant.   Squamous cell carcinoma of skin 07/03/2018   Left posterior midline scalp. WD SCC, acantholytic (adenoid) variant.   Type 2 diabetes mellitus treated with insulin ([place])    a.) uses Freestyle Libre CGM     Past Surgical History:  Procedure Laterality Date   APPENDECTOMY     AQUEOUS SHUNT Left 07/16/2016   Procedure: AQUEOUS SHUNT  left  diabetic;  Surgeon: Ronnell Freshwater, MD;  Location: [place];  Service: Ophthalmology;  Laterality: Left;  ahmed tube shunt diabetic - insulin sleep apnea   CARPAL TUNNEL RELEASE Left 01/11/2022   Procedure: CARPAL TUNNEL RELEASE;  Surgeon: Earnestine Leys, MD;  Location: [facility];  Service: Orthopedics;  Laterality: Left;   CARPAL TUNNEL RELEASE Right 02/15/2022   Procedure: CARPAL TUNNEL RELEASE;  Surgeon: Earnestine Leys, MD;  Location: [facility];  Service: Orthopedics;  Laterality: Right;   CORONARY ANGIOPLASTY WITH STENT PLACEMENT Left 05/03/2021   Procedure: CORONARY ANGIOPLASTY WITH STENT PLACEMENT; Location: [facility]   EYE SURGERY     cataract to both   KNEE ARTHROSCOPY Right    LEFT HEART CATH AND CORONARY ANGIOGRAPHY Left 01/15/2011   Procedure: LEFT HEART  CATH AND CORONARY ANGIOGRAPHY; Location: [place]; Surgeon: Neoma Laming, MD   LEFT HEART CATH AND CORONARY ANGIOGRAPHY N/A 03/24/2021   Procedure: LEFT HEART CATH AND CORONARY ANGIOGRAPHY with intervention;  Surgeon: Dionisio David, MD;  Location: [facility];  Service: Cardiovascular;  Laterality: N/A;   left toe removed Left    2nd toe   ROTATOR CUFF REPAIR Right 2015   TONSILLECTOMY       Current Outpatient Medications  Medication Sig Dispense Refill   acetaminophen (TYLENOL) 500 MG tablet Take 1,000 mg by mouth every 8 (eight) hours as needed.     amLODipine (NORVASC) 10 MG tablet Take 10 mg by mouth daily.     aspirin EC 81 MG tablet Take 81 mg by mouth at bedtime.     B-D 3CC LUER-LOK SYR 21GX1"  21G X 1" 3 ML MISC USE TO INJECT 1 ML OF TESTOSTERONE INTO MUSCLE  12   BD HYPODERMIC NEEDLE 18G X 1" MISC USE AS DIRECTED FOR WEEKLY INJECTIONS  1   BD ULTRA-FINE PEN NEEDLES 29G X 12.7MM MISC USE 2 (TWO) TIMES DAILY.  3   buprenorphine (SUBUTEX) 8 MG SUBL SL tablet Place 4-8 mg under the tongue 2 (two) times daily as needed (pain). Take 8 mg by mouth in the morning and 4 mg in the evening as needed for pain     cetirizine (ZYRTEC) 10 MG tablet Take 10 mg by mouth daily.     clopidogrel (PLAVIX) 75 MG tablet Take 75 mg by mouth daily.     Coenzyme Q10 (CO Q-10) 100 MG CAPS Take 100 mg by mouth in the morning and at bedtime.     Continuous Blood Gluc Receiver (FREESTYLE LIBRE READER) DEVI USE 1 EACH ONCE DAILY.  1   Continuous Blood Gluc Sensor (FREESTYLE LIBRE SENSOR SYSTEM) MISC USE 3 EACH EVERY 10 (TEN) DAYS.  11   diazepam (VALIUM) 5 MG tablet Take 2.5 mg by mouth daily as needed for anxiety.     Evolocumab (REPATHA SURECLICK) [id number] MG/ML SOAJ Inject into the skin every 14 (fourteen) days. Sundays     ezetimibe (ZETIA) 10 MG tablet TAKE ONE TABLET EVERY DAY (Patient taking differently: Take 10 mg by mouth at bedtime.) 90 tablet 0   gabapentin (NEURONTIN) 400 MG capsule Take 1 capsule (400 mg total) by mouth 3 (three) times daily. 60 capsule 3   gentamicin cream (GARAMYCIN) 0.1 % Apply 1 application topically 2 (two) times daily. (Patient taking differently: Apply 1 application  topically daily as needed (irritation).) 15 g 1   insulin aspart (NOVOLOG) 100 UNIT/ML FlexPen Inject 2-6 Units into the skin 3 (three) times daily as needed (blood sugar of 250 or above).     lisinopril-hydrochlorothiazide (ZESTORETIC) 20-25 MG tablet Take 1 tablet by mouth daily.     meloxicam (MOBIC) 15 MG tablet Take 1 tablet (15 mg total) by mouth daily. 30 tablet 3   metFORMIN (GLUCOPHAGE) 1000 MG tablet Take 1,000 mg by mouth 2 (two) times daily with a meal.     metoprolol tartrate (LOPRESSOR) 50 MG tablet Take 2  night before and 2 tab 90 miutes prior to ccta 6 tablet 0   Multiple Vitamins-Minerals (PRESERVISION AREDS 2 PO) Take 2 tablets by mouth in the morning and at bedtime.     mupirocin ointment (BACTROBAN) 2 % Apply 1 application. topically daily. Apply to any cuts, scrapes, wounds at body daily until healed. 30 g 11   NARCAN 4 MG/0.1ML LIQD  nasal spray kit Place 0.4 mg into the nose once.     ondansetron (ZOFRAN) 8 MG tablet Take 8 mg by mouth every 8 (eight) hours as needed.     pantoprazole (PROTONIX) 40 MG tablet Take 1 tablet (40 mg total) by mouth daily. 90 tablet 1   testosterone cypionate (DEPOTESTOSTERONE CYPIONATE) 200 MG/ML injection Inject 200 mg into the muscle every 14 (fourteen) days.      timolol (TIMOPTIC) 0.5 % ophthalmic solution Place 1 drop into the left eye 2 (two) times daily.   3   TOUJEO SOLOSTAR 300 UNIT/ML Solostar Pen Inject 48 Units into the skin at bedtime.     venlafaxine XR (EFFEXOR-XR) 37.5 MG 24 hr capsule Take 1 capsule (37.5 mg total) by mouth daily. 90 capsule 1   zolpidem (AMBIEN) 10 MG tablet Take 10 mg by mouth at bedtime as needed for sleep.      gabapentin (NEURONTIN) 400 MG capsule Take 1 capsule (400 mg total) by mouth 3 (three) times daily. (Patient not taking: Reported on 07/02/2022) 60 capsule 3   HYDROcodone-acetaminophen (NORCO) 5-325 MG tablet Take 1-2 tablets by mouth every 6 (six) hours as needed. (Patient not taking: Reported on 07/02/2022) 50 tablet 0   meloxicam (MOBIC) 15 MG tablet Take 1 tablet (15 mg total) by mouth daily. (Patient not taking: Reported on 07/02/2022) 30 tablet 3   Semaglutide, 1 MG/DOSE, 4 MG/3ML SOPN Inject 1 mg into the skin once a week. (Patient not taking: Reported on 07/30/2022)     sildenafil (REVATIO) 20 MG tablet Take 1-5 tablets (20-100 mg total) by mouth as needed. (Patient not taking: Reported on 07/02/2022) 50 tablet 12   VASCEPA 1 g capsule Take 2 capsules (2 g total) by mouth 2 (two) times daily. 120 capsule 3    Current Facility-Administered Medications  Medication Dose Route Frequency Provider Last Rate Last Admin   Study - EVOLVE-MI - evolocumab (REPATHA) 140 mg/mL SQ injection ([facility]-[place])  140 mg Subcutaneous Q14 Days Dionisio David, MD       Facility-Administered Medications Ordered in Other Visits  Medication Dose Route Frequency Provider Last Rate Last Admin   sodium chloride flush (NS) 0.9 % injection 3 mL  3 mL Intravenous Q12H Dionisio David, MD        Allergies:   Crestor [rosuvastatin], Lotensin [benazepril], Statins, and Zocor [simvastatin]    Social History:   reports that he has been smoking cigarettes. He has a 22.50 pack-year smoking history. He has never used smokeless tobacco. He reports current alcohol use. He reports current drug use. Drug: Marijuana.   Family History:  family history includes Alcohol abuse in his father; Heart disease in his father and mother.    ROS:     Review of Systems  Constitutional: Negative.   HENT: Negative.    Eyes: Negative.   Respiratory: Negative.    Gastrointestinal: Negative.   Genitourinary: Negative.   Musculoskeletal: Negative.   Skin: Negative.   Neurological: Negative.   Endo/Heme/Allergies: Negative.   Psychiatric/Behavioral: Negative.    All other systems reviewed and are negative.     All other systems are reviewed and negative.    PHYSICAL EXAM: VS:  BP 130/70   Pulse 72   Ht 5\' 9"  (1.753 m)   Wt 244 lb (110.7 kg)   SpO2 95%   BMI 36.03 kg/m  , BMI Body mass index is 36.03 kg/m. Last weight:  Wt Readings from Last 3 Encounters:  07/30/22 244  lb (110.7 kg)  07/02/22 238 lb 3.2 oz (108 kg)  02/15/22 242 lb (109.8 kg)     Physical Exam Vitals reviewed.  Constitutional:      Appearance: Normal appearance. He is normal weight.  HENT:     Head: Normocephalic.     Nose: Nose normal.     Mouth/Throat:     Mouth: Mucous membranes are moist.  Eyes:     Pupils: Pupils are equal, round, and reactive to  light.  Cardiovascular:     Rate and Rhythm: Normal rate and regular rhythm.     Pulses: Normal pulses.     Heart sounds: Normal heart sounds.  Pulmonary:     Effort: Pulmonary effort is normal.  Abdominal:     General: Abdomen is flat. Bowel sounds are normal.  Musculoskeletal:        General: Normal range of motion.     Cervical back: Normal range of motion.  Skin:    General: Skin is warm.  Neurological:     General: No focal deficit present.     Mental Status: He is alert.  Psychiatric:        Mood and Affect: Mood normal.       EKG:   Recent Labs: 07/02/2022: BUN 23; Creatinine, Ser 1.07; Potassium 4.5; Sodium 137    Lipid Panel    Component Value Date/Time   CHOL 142 09/01/2019 1012   CHOL 172 02/03/2018 0923   TRIG 117 09/01/2019 1012   TRIG 297 (H) 02/03/2018 0923   HDL 36 (L) 09/01/2019 1012   CHOLHDL 5.3 (H) 05/21/2017 1146   VLDL 59 (H) 02/03/2018 0923   LDLCALC 85 09/01/2019 1012      Other studies Reviewed: Additional studies/ records that were reviewed today include:  Review of the above records demonstrates:       No data to display            ASSESSMENT AND PLAN:    ICD-10-CM   1. Coronary artery disease involving native coronary artery of native heart without angina pectoris  I25.10 Study - EVOLVE-MI - evolocumab (REPATHA) 140 mg/mL SQ injection (PI-[facility]uc[facility])    2. Obstructive sleep apnea syndrome  G47.33 Study - EVOLVE-MI - evolocumab (REPATHA) 140 mg/mL SQ injection (PI-[facility])    3. Mixed hyperlipidemia  E78.2 Study - EVOLVE-MI - evolocumab (REPATHA) 140 mg/mL SQ injection (PI-[facility])       Problem List Items Addressed This Visit       Cardiovascular and Mediastinum   CAD (coronary artery disease) - Primary    No chest pain or SOB. CCTA showed ca score 3061, and stent in LAD no significant restenosis, LCX normal and RCA small non-dominant.      Relevant Medications   VASCEPA 1 g capsule   Study - EVOLVE-MI - evolocumab  (REPATHA) 140 mg/mL SQ injection (PI-[facility]) (Start on 07/30/2022 11:30 AM)     Respiratory   Sleep apnea   Relevant Medications   Study - EVOLVE-MI - evolocumab (REPATHA) 140 mg/mL SQ injection (PI-[place]) (Start on 07/30/2022 11:30 AM)     Other   Hyperlipidemia    Need to take repatha, as unable to tolerate statins, and has stent in LAD      Relevant Medications   VASCEPA 1 g capsule   Study - EVOLVE-MI - evolocumab (REPATHA) 140 mg/mL SQ injection (PI-[place]) (Start on 07/30/2022 11:30 AM)       Disposition:   Return in about 2 months (around 09/29/2022).  Total time spent: 45 minutes  Signed,  Neoma Laming, MD  07/30/2022 11:29 [place]

## 2022-07-30 NOTE — Assessment & Plan Note (Signed)
No chest pain or SOB. CCTA showed ca score 3061, and stent in LAD no significant restenosis, LCX normal and RCA small non-dominant.

## 2022-07-30 NOTE — Assessment & Plan Note (Signed)
Need to take repatha, as unable to tolerate statins, and has stent in LAD

## 2022-08-01 ENCOUNTER — Ambulatory Visit: Payer: PPO | Admitting: Dermatology

## 2022-08-01 VITALS — BP 110/71 | HR 71

## 2022-08-01 DIAGNOSIS — Z85828 Personal history of other malignant neoplasm of skin: Secondary | ICD-10-CM

## 2022-08-01 DIAGNOSIS — L578 Other skin changes due to chronic exposure to nonionizing radiation: Secondary | ICD-10-CM

## 2022-08-01 DIAGNOSIS — L814 Other melanin hyperpigmentation: Secondary | ICD-10-CM

## 2022-08-01 DIAGNOSIS — L570 Actinic keratosis: Secondary | ICD-10-CM | POA: Diagnosis not present

## 2022-08-01 DIAGNOSIS — L821 Other seborrheic keratosis: Secondary | ICD-10-CM

## 2022-08-01 DIAGNOSIS — D0461 Carcinoma in situ of skin of right upper limb, including shoulder: Secondary | ICD-10-CM | POA: Diagnosis not present

## 2022-08-01 DIAGNOSIS — D229 Melanocytic nevi, unspecified: Secondary | ICD-10-CM | POA: Diagnosis not present

## 2022-08-01 DIAGNOSIS — D485 Neoplasm of uncertain behavior of skin: Secondary | ICD-10-CM

## 2022-08-01 MED ORDER — CALCIPOTRIENE 0.005 % EX CREA: TOPICAL_CREAM | CUTANEOUS | 1 refills | Status: DC

## 2022-08-01 MED ORDER — FLUOROURACIL 5 % EX CREA: TOPICAL_CREAM | CUTANEOUS | 1 refills | Status: DC

## 2022-08-01 MED ORDER — TRIAMCINOLONE ACETONIDE 0.025 % EX OINT: TOPICAL_OINTMENT | CUTANEOUS | 1 refills | Status: DC

## 2022-08-01 NOTE — Patient Instructions (Addendum)
Wound Care Instructions  Cleanse wound gently with soap and water once a day then pat dry with clean gauze. Apply a thin coat of Petrolatum (petroleum jelly, "Vaseline") over the wound (unless you have an allergy to this). We recommend that you use a new, sterile tube of Vaseline. Do not pick or remove scabs. Do not remove the yellow or white "healing tissue" from the base of the wound.  Cover the wound with fresh, clean, nonstick gauze and secure with paper tape. You may use Band-Aids in place of gauze and tape if the wound is small enough, but would recommend trimming much of the tape off as there is often too much. Sometimes Band-Aids can irritate the skin.  You should call the office for your biopsy report after 1 week if you have not already been contacted.  If you experience any problems, such as abnormal amounts of bleeding, swelling, significant bruising, significant pain, or evidence of infection, please call the office immediately.  FOR ADULT SURGERY PATIENTS: If you need something for pain relief you may take 1 extra strength Tylenol (acetaminophen) AND 2 Ibuprofen (200mg  each) together every 4 hours as needed for pain. (do not take these if you are allergic to them or if you have a reason you should not take them.) Typically, you may only need pain medication for 1 to 3 days.     Start Fluorouracil 5% cream/Calcipotriene cream twice daily for four days on the face and twice daily for seven days to the ears and the sides of neck. Treat the ears twice. Let areas heal before rotating to other areas. Also treat the forearms and tops of arms as well as the back of the neck.  5-Fluorouracil/Calcipotriene Patient Education   Actinic keratoses are the dry, red scaly spots on the skin caused by sun damage. A portion of these spots can turn into skin cancer with time, and treating them can help prevent development of skin cancer.   Treatment of these spots requires removal of the defective  skin cells. There are various ways to remove actinic keratoses, including freezing with liquid nitrogen, treatment with creams, or treatment with a blue light procedure in the office.   5-fluorouracil cream is a topical cream used to treat actinic keratoses. It works by interfering with the growth of abnormal fast-growing skin cells, such as actinic keratoses. These cells peel off and are replaced by healthy ones. THIS CREAM SHOULD BE KEPT OUT OF REACH OF CHILDREN AND PETS AND SHOULD NOT BE USED BY PREGNANT WOMEN.  5-fluorouracil/calcipotriene is a combination of the 5-fluorouracil cream with a vitamin D analog cream called calcipotriene. The calcipotriene alone does not treat actinic keratoses. However, when it is combined with 5-fluorouracil, it helps the 5-fluorouracil treat the actinic keratoses much faster so that the same results can be achieved with a much shorter treatment time.  INSTRUCTIONS FOR 5-FLUOROURACIL/CALCIPOTRIENE CREAM:   5-fluorouracil/calcipotriene cream typically only needs to be used for 4-7 days. A thin layer should be applied twice a day to the treatment areas recommended by your physician.   If your physician prescribed you separate tubes of 5-fluourouracil and calcipotriene, apply a thin layer of 5-fluorouracil followed by a thin layer of calcipotriene.   Avoid contact with your eyes or nostrils. Avoid applying the cream to your eyelids or lips unless directed to apply there by your physician. Do not use 5-fluorouracil/calcipotriene cream on infected or open wounds.   You will develop redness, irritation and some crusting at areas  where you have pre-cancer damage/actinic keratoses. IF YOU DEVELOP PAIN, BLEEDING, OR SIGNIFICANT CRUSTING, STOP THE TREATMENT EARLY - you have already gotten a good response and the actinic keratoses should clear up well.  Wash your hands after applying 5-fluorouracil 5% cream on your skin.   A moisturizer or sunscreen with a minimum SPF 30  should be applied each morning.   Once you have finished the treatment, you can apply a thin layer of Vaseline twice a day to irritated areas to soothe and calm the areas more quickly. If you experience significant discomfort, contact your physician.  For some patients it is necessary to repeat the treatment for best results.  SIDE EFFECTS: When using 5-fluorouracil/calcipotriene cream, you may have mild irritation, such as redness, dryness, swelling, or a mild burning sensation. This usually resolves within 2 weeks. The more actinic keratoses you have, the more redness and inflammation you can expect during treatment. Eye irritation has been reported rarely. If this occurs, please let us know.   If you have any trouble using this cream, please send [place] a MyChart message or call the office. If you have any other questions about this information, please do not hesitate to ask me before you leave the office or contact me on MyChart or by phone.  .  Due to recent changes in healthcare laws, you may see results of your pathology and/or laboratory studies on MyChart before the doctors have had a chance to review them. We understand that in some cases there may be results that are confusing or concerning to you. Please understand that not all results are received at the same time and often the doctors may need to interpret multiple results in order to provide you with the best plan of care or course of treatment. Therefore, we ask that you please give [place] 2 business days to thoroughly review all your results before contacting the office for clarification. Should we see a critical lab result, you will be contacted sooner.   If You Need Anything After Your Visit  If you have any questions or concerns for your doctor, please call our main line at [phone] and press option 4 to reach your doctor's medical assistant. If no one answers, please leave a voicemail as directed and we will return your call as soon  as possible. Messages left after 4 pm will be answered the following business day.   You may also send [place] a message via [employer]. We typically respond to MyChart messages within 1-2 business days.  For prescription refills, please ask your pharmacy to contact our office. Our fax number is [phone].  If you have an urgent issue when the clinic is closed that cannot wait until the next business day, you can page your doctor at the number below.    Please note that while we do our best to be available for urgent issues outside of office hours, we are not available 24/7.   If you have an urgent issue and are unable to reach [place], you may choose to seek medical care at your doctor's office, retail clinic, urgent care center, or emergency room.  If you have a medical emergency, please immediately call 911 or go to the emergency department.  Pager Numbers  - Dr. Nehemiah Massed: [phone]  - Dr. Laurence Ferrari: [phone]  - Dr. Nicole Kindred: [phone]  In the event of inclement weather, please call our main line at [phone] for an update on the status of any delays or closures.  Dermatology  Medication Tips: Please keep the boxes that topical medications come in in order to help keep track of the instructions about where and how to use these. Pharmacies typically print the medication instructions only on the boxes and not directly on the medication tubes.   If your medication is too expensive, please contact our office at [phone] option 4 or send [place] a message through [employer].   We are unable to tell what your co-pay for medications will be in advance as this is different depending on your insurance coverage. However, we may be able to find a substitute medication at lower cost or fill out paperwork to get insurance to cover a needed medication.   If a prior authorization is required to get your medication covered by your insurance company, please allow [place] 1-2 business days to complete this  process.  Drug prices often vary depending on where the prescription is filled and some pharmacies may offer cheaper prices.  The website www.goodrx.com contains coupons for medications through different pharmacies. The prices here do not account for what the cost may be with help from insurance (it may be cheaper with your insurance), but the website can give you the price if you did not use any insurance.  - You can print the associated coupon and take it with your prescription to the pharmacy.  - You may also stop by our office during regular business hours and pick up a GoodRx coupon card.  - If you need your prescription sent electronically to a different pharmacy, notify our office through [facility] or by phone at [phone] option 4.     Si Usted Necesita Algo Despus de Su Visita  Tambin puede enviarnos un mensaje a travs de Pharmacist, community. Por lo general respondemos a los mensajes de MyChart en el transcurso de 1 a 2 das hbiles.  Para renovar recetas, por favor pida a su farmacia que se ponga en contacto con nuestra oficina. Harland Dingwall de fax es [place] [phone].  Si tiene un asunto urgente cuando la clnica est cerrada y que no puede esperar hasta el siguiente da hbil, puede llamar/localizar a su doctor(a) al nmero que aparece a continuacin.   Por favor, tenga en cuenta que aunque hacemos todo lo posible para estar disponibles para asuntos urgentes fuera del horario de [place], no estamos disponibles las 24 horas del da, los 7 das de la [place].   Si tiene un problema urgente y no puede comunicarse con nosotros, puede optar por buscar atencin mdica  en el consultorio de su doctor(a), en una clnica privada, en un centro de atencin urgente o en una sala de emergencias.  Si tiene Engineering geologist, por favor llame inmediatamente al 911 o vaya a la sala de emergencias.  Nmeros de bper  - Dr. Nehemiah Massed: [phone]  - Dra. Moye: [phone]  - Dra.  Nicole Kindred: [phone]  En caso de inclemencias del [place], por favor llame a Johnsie Kindred principal al [phone] para una actualizacin sobre el [place] de cualquier retraso o cierre.  Consejos para la medicacin en dermatologa: Por favor, guarde las cajas en las que vienen los medicamentos de uso tpico para ayudarle a seguir las instrucciones sobre dnde y cmo usarlos. Las farmacias generalmente imprimen las instrucciones del medicamento slo en las cajas y no directamente en los tubos del [place].   Si su medicamento es muy caro, por favor, pngase en contacto con Zigmund Daniel llamando al [phone] y presione la opcin 4 o envenos un mensaje  a travs de MyChart.   No podemos decirle cul ser su copago por los medicamentos por adelantado ya que esto es diferente dependiendo de la cobertura de su seguro. Sin embargo, es posible que podamos encontrar un medicamento sustituto a Electrical engineer un formulario para que el seguro cubra el medicamento que se considera necesario.   Si se requiere una autorizacin previa para que su compaa de seguros [place] su medicamento, por favor permtanos de 1 a 2 das hbiles para completar este proceso.  Los precios de los medicamentos varan con frecuencia dependiendo del Environmental consultant de dnde se surte la receta y alguna farmacias pueden ofrecer precios ms baratos.  El sitio web www.goodrx.com tiene cupones para medicamentos de Airline pilot. Los precios aqu no tienen en cuenta lo que podra costar con la ayuda del seguro (puede ser ms barato con su seguro), pero el sitio web puede darle el precio si no utiliz Research scientist (physical sciences).  - Puede imprimir el cupn correspondiente y llevarlo con su receta a la farmacia.  - Tambin puede pasar por nuestra oficina durante el horario de atencin regular y Charity fundraiser una tarjeta de cupones de GoodRx.  - Si necesita que su receta se enve electrnicamente a una farmacia diferente, informe a nuestra oficina a  travs de MyChart de [place] o por telfono llamando al [phone] y presione la opcin 4.

## 2022-08-01 NOTE — Progress Notes (Signed)
Follow-Up Visit   Subjective  Joshua LANA Sr. is a 67 y.o. male who presents for the following: Spots - on the back that his wife noticed and is concerned about. Patient has also noticed scaly lesions on the face and scalp and would like those areas checked today as well.   The patient has spots, moles and lesions to be evaluated, some may be new or changing and the patient has concerns that these could be cancer.    The following portions of the chart were reviewed this encounter and updated as appropriate: medications, allergies, medical history  Review of Systems:  No other skin or systemic complaints except as noted in HPI or Assessment and Plan.  Objective  Well appearing patient in no apparent distress; mood and affect are within normal limits.  A focused examination was performed of the following areas: The face, scalp, arms, hands, and back   Relevant physical exam findings are noted in the Assessment and Plan.  R elbow 0.7 cm firm pink papule.       Assessment & Plan   SEBORRHEIC KERATOSIS - Stuck-on, waxy, tan-brown papules and/or plaques  - Benign-appearing - Discussed benign etiology and prognosis. - Observe - Call for any changes  MELANOCYTIC NEVI Benign appearing on exam today. Recommend observation. Call clinic for new or changing moles. Recommend daily use of broad spectrum spf 30+ sunscreen to sun-exposed areas.  Exam Tan-brown and/or pink-flesh-colored symmetric macules and papules  Lentigines - Scattered tan macules - Due to sun exposure - Benign-appearing, observe - Recommend daily broad spectrum sunscreen SPF 30+ to sun-exposed areas, reapply every 2 hours as needed. - Call for any changes  ACTINIC DAMAGE WITH PRECANCEROUS ACTINIC KERATOSES Counseling for Topical Chemotherapy Management: Patient exhibits: - Severe, confluent actinic changes with pre-cancerous actinic keratoses that is secondary to cumulative UV radiation exposure  over time - Condition that is severe; chronic, not at goal. - diffuse scaly erythematous macules and papules with underlying dyspigmentation - Discussed Prescription "Field Treatment" topical Chemotherapy for Severe, Chronic Confluent Actinic Changes with Pre-Cancerous Actinic Keratoses Field treatment involves treatment of an entire area of skin that has confluent Actinic Changes (Sun/ Ultraviolet light damage) and PreCancerous Actinic Keratoses by method of PhotoDynamic Therapy (PDT) and/or prescription Topical Chemotherapy agents such as 5-fluorouracil, 5-fluorouracil/calcipotriene, and/or imiquimod.  The purpose is to decrease the number of clinically evident and subclinical PreCancerous lesions to prevent progression to development of skin cancer by chemically destroying early precancer changes that may or may not be visible.  It has been shown to reduce the risk of developing skin cancer in the treated area. As a result of treatment, redness, scaling, crusting, and open sores may occur during treatment course. One or more than one of these methods may be used and may have to be used several times to control, suppress and eliminate the PreCancerous changes. Discussed treatment course, expected reaction, and possible side effects. - Recommend daily broad spectrum sunscreen SPF 30+ to sun-exposed areas, reapply every 2 hours as needed.  - Staying in the shade or wearing long sleeves, sun glasses (UVA+UVB protection) and wide brim hats (4-inch brim around the entire circumference of the hat) are also recommended. - Call for new or changing lesions. - Start 5FU/Calcipotriene cream BID x 4 days on the face and BID x 7 days to the ears and neck. Treat the ears twice. Let areas heal before rotating to other areas. Also treat the forearms and tops of arms.  -  Start TMC 0.025% ointment for "cool down" treatment if needed after finishing 5FU/calcipotriene. Apply BID x 1 week PRN.  - Discussed chemical peel to  the scalp (patient would likely need 2-3). hypertrophic Aks are too thick on the scalp to treat with topical chemotherapy. Discussed multiple treatments of LN2 advised patient that treating with LN2 may leave white spots to area. Pt would like to come in once a month to treat the scalp with LN2, until AKs under control again. - Discuss nicotinamide at follow up, patient has type 2 diabetes  ACTINIC KERATOSIS - HYPERTROPHIC, will treat ears, hands and arms with debriding prior to treating with 5FU/calcipotriene at follow-up  Exam: Erythematous thin papules/macules with thick gritty scale  Actinic keratoses are precancerous spots that appear secondary to cumulative UV radiation exposure/sun exposure over time. They are chronic with expected duration over 1 year. A portion of actinic keratoses will progress to squamous cell carcinoma of the skin. It is not possible to reliably predict which spots will progress to skin cancer and so treatment is recommended to prevent development of skin cancer.  Recommend daily broad spectrum sunscreen SPF 30+ to sun-exposed areas, reapply every 2 hours as needed.  Recommend staying in the shade or wearing long sleeves, sun glasses (UVA+UVB protection) and wide brim hats (4-inch brim around the entire circumference of the hat). Call for new or changing lesions.  Treatment Plan:  Prior to procedure, discussed risks of blister formation, small wound, skin dyspigmentation, or rare scar following cryotherapy. Recommend Vaseline ointment to treated areas while healing.  Destruction Procedure Note Destruction method: cryotherapy   Informed consent: discussed and consent obtained   Lesion destroyed using liquid nitrogen: Yes   Outcome: patient tolerated procedure well with no complications   Post-procedure details: wound care instructions given   Locations: R helix x 4, R anti helix x 2, L helix x 6, L anti helix x 1, L preauricular x 3, R preauricular x 3, scalp x 13,  L forearm x 2 # of Lesions Treated: 34  Neoplasm of uncertain behavior of skin R elbow  Skin / nail biopsy Type of biopsy: tangential   Informed consent: discussed and consent obtained   Timeout: patient name, date of birth, surgical site, and procedure verified   Procedure prep:  Patient was prepped and draped in usual sterile fashion Prep type:  Isopropyl alcohol Anesthesia: the lesion was anesthetized in a standard fashion   Anesthetic:  1% lidocaine w/ epinephrine 1-100,000 buffered w/ 8.4% NaHCO3 Instrument used: flexible razor blade   Hemostasis achieved with: pressure, aluminum chloride and electrodesiccation   Outcome: patient tolerated procedure well   Post-procedure details: sterile dressing applied and wound care instructions given   Dressing type: bandage and petrolatum    Specimen 1 - Surgical pathology Differential Diagnosis: D48.5 r/o SCC Check Margins: No  R/O SCC  HISTORY OF SQUAMOUS CELL CARCINOMA OF THE SKIN - RTC for FBSE   Return in about 1 month (around 09/01/2022) for AK follow up patient needs 20 minute appointment.  Luther Redo, CMA, am acting as scribe for Forest Gleason, MD .   Documentation: I have reviewed the above documentation for accuracy and completeness, and I agree with the above.  Forest Gleason, MD

## 2022-08-08 ENCOUNTER — Telehealth: Payer: Self-pay

## 2022-08-08 NOTE — Telephone Encounter (Signed)
Patient advised of information and BX results per Dr. Laurence Ferrari. aw

## 2022-08-08 NOTE — Telephone Encounter (Signed)
Called patient. N/A. LMOVM to return my call.  

## 2022-08-08 NOTE — Telephone Encounter (Signed)
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 Please let me know if they have questions for me.   Thank you!

## 2022-08-18 MED ORDER — BUPRENORPHINE HCL 8 MG SUBLINGUAL TABLET
ORAL_TABLET | 2 refills | 0 days
Start: 2022-08-18 — End: 2022-11-14

## 2022-08-20 ENCOUNTER — Ambulatory Visit: Admit: 2022-08-20 | Discharge: 2022-08-21 | Payer: PRIVATE HEALTH INSURANCE

## 2022-08-20 DIAGNOSIS — E669 Obesity, unspecified: Principal | ICD-10-CM

## 2022-08-20 DIAGNOSIS — R110 Nausea: Principal | ICD-10-CM

## 2022-08-20 DIAGNOSIS — F1190 Opioid use, unspecified, uncomplicated: Principal | ICD-10-CM

## 2022-08-20 DIAGNOSIS — R32 Unspecified urinary incontinence: Principal | ICD-10-CM

## 2022-08-20 DIAGNOSIS — Z79899 Other long term (current) drug therapy: Principal | ICD-10-CM

## 2022-08-20 DIAGNOSIS — E1142 Type 2 diabetes mellitus with diabetic polyneuropathy: Principal | ICD-10-CM

## 2022-08-20 MED ORDER — ONDANSETRON HCL 8 MG TABLET
ORAL_TABLET | Freq: Two times a day (BID) | ORAL | 5 refills | 30 days | Status: CP
Start: 2022-08-20 — End: 2023-08-20

## 2022-08-20 MED ORDER — BUPRENORPHINE HCL 8 MG SUBLINGUAL TABLET
ORAL_TABLET | 2 refills | 0 days | Status: CP
Start: 2022-08-20 — End: 2022-11-14

## 2022-08-20 MED ORDER — PREGABALIN 50 MG CAPSULE
ORAL_CAPSULE | Freq: Every evening | ORAL | 2 refills | 30 days | Status: CP
Start: 2022-08-20 — End: 2022-11-18

## 2022-08-20 MED ORDER — PHENTERMINE 37.5 MG TABLET
ORAL_TABLET | Freq: Every day | ORAL | 0 refills | 90 days | Status: CP
Start: 2022-08-20 — End: 2022-11-18

## 2022-09-04 ENCOUNTER — Ambulatory Visit: Payer: PPO | Admitting: Dermatology

## 2022-09-25 ENCOUNTER — Ambulatory Visit: Payer: PPO | Admitting: Dermatology

## 2022-09-27 ENCOUNTER — Telehealth: Payer: Self-pay

## 2022-09-27 NOTE — Telephone Encounter (Signed)
Patient called wanting to stop Nexlizet (causing balance problems) and go back on Zetia. Please send RX for Zetia 10 mg qd to [facility].

## 2022-10-01 ENCOUNTER — Ambulatory Visit: Payer: PPO | Admitting: Cardiovascular Disease

## 2022-10-02 ENCOUNTER — Telehealth: Payer: Self-pay

## 2022-10-02 MED ORDER — REPATHA SURECLICK 140 MG/ML ~~LOC~~ SOAJ: 140.0000 mg | SUBCUTANEOUS | 0 refills | Status: DC

## 2022-10-02 NOTE — Telephone Encounter (Signed)
Patient called stating he's leaving to go out of town today until Sunday and he needs to get his cholesterol medication but does not want the nexletol or nexlizet please send in other requested medication

## 2022-10-02 NOTE — Addendum Note (Signed)
Addended byKatherine Mantle on: 10/02/2022 12:01 PM   Modules accepted: Orders

## 2022-10-08 ENCOUNTER — Telehealth: Payer: Self-pay | Admitting: Dermatology

## 2022-10-08 NOTE — Telephone Encounter (Signed)
  Tried calling patient to scheduled appt to treat scc at right forearm. No answer. LMOM for patient to return call.     Patient canceled his appointment for treatment of the skin cancer on his arm. I could add him to clinic tomorrow afternoon if he could come, or we could reschedule him next available with one of the other doctors/with Dr. Katrinka Blazing in August. If he declines scrape and burn, we could try a cream treatment and recheck it, but it has to be used for weeks, can make the area irritated, and is less likely to clear the skin cancer than a scrape and burn.   If he declines treatment, we need to document that we discussed this can grow deeper and spread other places in the body or sometimes become life threatening (cause death) if left untreated.   Thank you!

## 2022-10-08 NOTE — Telephone Encounter (Signed)
Patient canceled his appointment for treatment of the skin cancer on his arm. I could add him to clinic tomorrow afternoon if he could come, or we could reschedule him next available with one of the other doctors/with Dr. Katrinka Blazing in August. If he declines scrape and burn, we could try a cream treatment and recheck it, but it has to be used for weeks, can make the area irritated, and is less likely to clear the skin cancer than a scrape and burn.   If he declines treatment, we need to document that we discussed this can grow deeper and spread other places in the body or sometimes become life threatening (cause death) if left untreated.   Thank you!

## 2022-10-19 ENCOUNTER — Telehealth: Payer: Self-pay | Admitting: Dermatology

## 2022-10-19 NOTE — Telephone Encounter (Addendum)
Please send a certified letter. Thank you!     Dear Mr. Spaulding,  I hope this letter finds you well.  It has been my sincere pleasure to be a part of your healthcare team. At the time of my leaving, our records show you have a skin cancer at your right elbow that has not yet been treated.   Since I will be unable to follow-up with you in future, I am writing to stress the importance of rescheduling and keeping your skin cancer treatment appointment. When treated, skin cancer typically has very good outcomes. However, if skin cancer is left untreated it can pose serious health risks including destroying nearby tissue or even death.   Please reach out to our office at [phone] option 4 or through MyChart so that we can help you reschedule your treatment appointment.  If you have any concerns about your treatment and wish to discuss other treatment options, please reach out to our office as well. You can reach [place] by phone at [phone] option 4 or through [employer].   As I am transitioning from the practice, I regret that I will not be able to continue with your care personally. However, I am confident you are in capable hands with my colleagues and the team at [facility].   Thank you for entrusting me with your skin health. 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 Please do not hesitate to reach out if you have any questions or concerns.  Wishing you continued health and well-being.  Sincerely,  Sandi Mealy, MD, MPH Dermatologist [facility] [phone] option 4

## 2022-10-22 NOTE — Telephone Encounter (Signed)
Patient has switched appointment with his wife because he has not started the topical cream yet. He will treat towards end of summer. Patient will call to reschedule appt.

## 2022-10-24 ENCOUNTER — Encounter: Payer: Self-pay | Admitting: Dermatology

## 2022-10-29 DIAGNOSIS — E1142 Type 2 diabetes mellitus with diabetic polyneuropathy: Principal | ICD-10-CM

## 2022-10-29 MED ORDER — PREGABALIN 50 MG CAPSULE
ORAL_CAPSULE | 0 refills | 0 days
Start: 2022-10-29 — End: ?

## 2022-10-30 MED ORDER — PREGABALIN 50 MG CAPSULE
ORAL_CAPSULE | 0 refills | 0 days
Start: 2022-10-30 — End: ?

## 2022-11-07 ENCOUNTER — Other Ambulatory Visit: Payer: Self-pay | Admitting: Cardiovascular Disease

## 2022-11-07 DIAGNOSIS — J3489 Other specified disorders of nose and nasal sinuses: Principal | ICD-10-CM

## 2022-11-07 DIAGNOSIS — K2100 Gastro-esophageal reflux disease with esophagitis, without bleeding: Secondary | ICD-10-CM

## 2022-11-07 MED ORDER — CETIRIZINE 10 MG TABLET
ORAL_TABLET | Freq: Every day | ORAL | 3 refills | 90 days | Status: CP
Start: 2022-11-07 — End: ?

## 2022-11-23 ENCOUNTER — Ambulatory Visit: Admit: 2022-11-23 | Discharge: 2022-11-24 | Payer: PRIVATE HEALTH INSURANCE

## 2022-11-23 DIAGNOSIS — Z122 Encounter for screening for malignant neoplasm of respiratory organs: Principal | ICD-10-CM

## 2022-11-23 DIAGNOSIS — I10 Essential (primary) hypertension: Principal | ICD-10-CM

## 2022-11-23 DIAGNOSIS — Z125 Encounter for screening for malignant neoplasm of prostate: Principal | ICD-10-CM

## 2022-11-23 DIAGNOSIS — F1190 Opioid use, unspecified, uncomplicated: Principal | ICD-10-CM

## 2022-11-23 DIAGNOSIS — Z794 Long term (current) use of insulin: Principal | ICD-10-CM

## 2022-11-23 DIAGNOSIS — E1142 Type 2 diabetes mellitus with diabetic polyneuropathy: Principal | ICD-10-CM

## 2022-11-23 DIAGNOSIS — F17200 Nicotine dependence, unspecified, uncomplicated: Principal | ICD-10-CM

## 2022-11-23 DIAGNOSIS — Z87891 Personal history of nicotine dependence: Principal | ICD-10-CM

## 2022-11-23 DIAGNOSIS — Z0000 Encounter for general adult medical examination without abnormal findings: Principal | ICD-10-CM

## 2022-11-23 DIAGNOSIS — Z79899 Other long term (current) drug therapy: Principal | ICD-10-CM

## 2022-11-23 DIAGNOSIS — E113293 Type 2 diabetes mellitus with mild nonproliferative diabetic retinopathy without macular edema, bilateral: Principal | ICD-10-CM

## 2022-11-23 MED ORDER — PREGABALIN 50 MG CAPSULE
ORAL_CAPSULE | Freq: Three times a day (TID) | ORAL | 2 refills | 30 days | Status: CP
Start: 2022-11-23 — End: 2023-02-21

## 2022-11-23 MED ORDER — BUPRENORPHINE HCL 8 MG SUBLINGUAL TABLET
ORAL_TABLET | 2 refills | 0 days | Status: CP
Start: 2022-11-23 — End: 2023-02-17

## 2022-11-26 ENCOUNTER — Other Ambulatory Visit: Payer: Self-pay | Admitting: *Deleted

## 2022-11-26 DIAGNOSIS — Z87891 Personal history of nicotine dependence: Secondary | ICD-10-CM

## 2022-11-26 DIAGNOSIS — Z122 Encounter for screening for malignant neoplasm of respiratory organs: Secondary | ICD-10-CM

## 2022-12-12 ENCOUNTER — Ambulatory Visit: Payer: PPO | Admitting: Dermatology

## 2022-12-12 ENCOUNTER — Encounter: Payer: Self-pay | Admitting: Dermatology

## 2022-12-12 DIAGNOSIS — L578 Other skin changes due to chronic exposure to nonionizing radiation: Secondary | ICD-10-CM | POA: Diagnosis not present

## 2022-12-12 DIAGNOSIS — W908XXA Exposure to other nonionizing radiation, initial encounter: Secondary | ICD-10-CM | POA: Diagnosis not present

## 2022-12-12 DIAGNOSIS — C44629 Squamous cell carcinoma of skin of left upper limb, including shoulder: Secondary | ICD-10-CM

## 2022-12-12 DIAGNOSIS — D492 Neoplasm of unspecified behavior of bone, soft tissue, and skin: Secondary | ICD-10-CM

## 2022-12-12 DIAGNOSIS — L570 Actinic keratosis: Secondary | ICD-10-CM

## 2022-12-12 DIAGNOSIS — Z86007 Personal history of in-situ neoplasm of skin: Secondary | ICD-10-CM

## 2022-12-12 DIAGNOSIS — Z5111 Encounter for antineoplastic chemotherapy: Secondary | ICD-10-CM

## 2022-12-12 NOTE — Progress Notes (Signed)
Follow-Up Visit   Subjective  Joshua RISPER Sr. is a 67 y.o. 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 Bx by Dr. Neale Burly 08/01/2022.   New spot on left elbow. Will not heal. Bleeds. Dur: several weeks.  The patient has spots, moles and lesions to be evaluated, some may be new or changing and the patient may have concern these could be cancer.   The following portions of the chart were reviewed this encounter and updated as appropriate: medications, allergies, medical history  Review of Systems:  No other skin or systemic complaints except as noted in HPI or Assessment and Plan.  Objective  Well appearing patient in no apparent distress; mood and affect are within normal limits.  A focused examination was performed of the following areas: Right elbow  Relevant exam findings are noted in the Assessment and Plan.  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 Bx 08/01/2022. Clear today on clinical exam. - No evidence of recurrence today - Recommend regular full body skin exams - Recommend daily broad spectrum sunscreen SPF 30+ to sun-exposed areas, reapply every 2 hours as needed.  - Call if any new or changing lesions are noted between office visits   ACTINIC DAMAGE WITH PRECANCEROUS ACTINIC KERATOSES Counseling for Topical Chemotherapy Management: Patient exhibits: - Severe, confluent actinic changes with pre-cancerous actinic keratoses that is secondary to cumulative UV radiation exposure over time - Condition that is severe; chronic, not at goal. - diffuse scaly erythematous macules and papules with underlying dyspigmentation - Discussed Prescription "Field Treatment" topical Chemotherapy for Severe, Chronic Confluent Actinic Changes with Pre-Cancerous Actinic Keratoses Field treatment involves treatment of an  entire area of skin that has confluent Actinic Changes (Sun/ Ultraviolet light damage) and PreCancerous Actinic Keratoses by method of PhotoDynamic Therapy (PDT) and/or prescription Topical Chemotherapy agents such as 5-fluorouracil, 5-fluorouracil/calcipotriene, and/or imiquimod.  The purpose is to decrease the number of clinically evident and subclinical PreCancerous lesions to prevent progression to development of skin cancer by chemically destroying early precancer changes that may or may not be visible.  It has been shown to reduce the risk of developing skin cancer in the treated area. As a result of treatment, redness, scaling, crusting, and open sores may occur during treatment course. One or more than one of these methods may be used and may have to be used several times to control, suppress and eliminate the PreCancerous changes. Discussed treatment course, expected reaction, and possible side effects. - Recommend daily broad spectrum sunscreen SPF 30+ to sun-exposed areas, reapply every 2 hours as needed.  - Staying in the shade or wearing long sleeves, sun glasses (UVA+UVB protection) and wide brim hats (4-inch brim around the entire circumference of the hat) are also recommended. - Call for new or changing lesions.  Start 5FU/Calcipotriene cream twice daily x 4 days on the face and twice daily x 7-10 days to the ears and neck and scalp. Treat the ears twice. Let areas heal before rotating to other areas. Also treat the forearms and tops of arms.  - Start TMC 0.025% ointment for "cool down" treatment if needed after finishing 5FU/calcipotriene. Apply twice daily x 1 week as needed.    Neoplasm of skin Left Elbow - Posterior  Skin / nail biopsy Type of biopsy: tangential  Informed consent: discussed and consent obtained   Anesthesia: the lesion was anesthetized in a standard fashion   Anesthesia comment:  Area prepped with alcohol Anesthetic:  1% lidocaine w/ epinephrine 1-100,000  buffered w/ 8.4% NaHCO3 Instrument used: DermaBlade   Hemostasis achieved with: pressure, aluminum chloride and electrodesiccation   Outcome: patient tolerated procedure well   Post-procedure details: wound care instructions given   Post-procedure details comment:  Ointment and small bandage applied  Specimen 1 - Surgical pathology Differential Diagnosis: R/O SCC vs BCC vs Chronic Wound  Check Margins: No   Return in about 1 month (around 01/12/2023) for AK Follow Up, TBSE.  I, Lawson Radar, CMA, am acting as scribe for Elie Goody, MD.   Documentation: I have reviewed the above documentation for accuracy and completeness, and I agree with the above.  Elie Goody, MD

## 2022-12-12 NOTE — Patient Instructions (Addendum)
Start 5FU/Calcipotriene cream twice daily x 4 days on the face and twice daily x 7-10 days to the ears and neck and scalp. Treat the ears twice. Let areas heal before rotating to other areas. Also treat the forearms and tops of arms.  - Start TMC 0.025% ointment for "cool down" treatment if needed after finishing 5FU/calcipotriene. Apply twice daily x 1 week as needed.   Wound Care Instructions  Cleanse wound gently with soap and water once a day then pat dry with clean gauze. Apply a thin coat of Petrolatum (petroleum jelly, "Vaseline") over the wound (unless you have an allergy to this). We recommend that you use a new, sterile tube of Vaseline. Do not pick or remove scabs. Do not remove the yellow or white "healing tissue" from the base of the wound.  Cover the wound with fresh, clean, nonstick gauze and secure with paper tape. You may use Band-Aids in place of gauze and tape if the wound is small enough, but would recommend trimming much of the tape off as there is often too much. Sometimes Band-Aids can irritate the skin.  You should call the office for your biopsy report after 1 week if you have not already been contacted.  If you experience any problems, such as abnormal amounts of bleeding, swelling, significant bruising, significant pain, or evidence of infection, please call the office immediately.  FOR ADULT SURGERY PATIENTS: If you need something for pain relief you may take 1 extra strength Tylenol (acetaminophen) AND 2 Ibuprofen (200mg  each) together every 4 hours as needed for pain. (do not take these if you are allergic to them or if you have a reason you should not take them.) Typically, you may only need pain medication for 1 to 3 days.   Recommend daily broad spectrum sunscreen SPF 30+ to sun-exposed areas, reapply every 2 hours as needed. Call for new or changing lesions.  Staying in the shade or wearing long sleeves, sun glasses (UVA+UVB protection) and wide brim hats (4-inch  brim around the entire circumference of the hat) are also recommended for sun protection.   Due to recent changes in healthcare laws, you may see results of your pathology and/or laboratory studies on MyChart before the doctors have had a chance to review them. We understand that in some cases there may be results that are confusing or concerning to you. Please understand that not all results are received at the same time and often the doctors may need to interpret multiple results in order to provide you with the best plan of care or course of treatment. Therefore, we ask that you please give [place] 2 business days to thoroughly review all your results before contacting the office for clarification. Should we see a critical lab result, you will be contacted sooner.   If You Need Anything After Your Visit  If you have any questions or concerns for your doctor, please call our main line at [phone] and press option 4 to reach your doctor's medical assistant. If no one answers, please leave a voicemail as directed and we will return your call as soon as possible. Messages left after 4 pm will be answered the following business day.   You may also send [place] a message via MyChart. We typically respond to MyChart messages within 1-2 business days.  For prescription refills, please ask your pharmacy to contact our office. Our fax number is [phone].  If you have an urgent issue when the clinic is closed that  cannot wait until the next business day, you can page your doctor at the number below.    Please note that while we do our best to be available for urgent issues outside of office hours, we are not available 24/7.   If you have an urgent issue and are unable to reach [place], you may choose to seek medical care at your doctor's office, retail clinic, urgent care center, or emergency room.  If you have a medical emergency, please immediately call 911 or go to the emergency department.  Pager  Numbers  - Dr. Gwen Pounds: [phone]  - Dr. Roseanne Reno: [phone]  In the event of inclement weather, please call our main line at [phone] for an update on the status of any delays or closures.  Dermatology Medication Tips: Please keep the boxes that topical medications come in in order to help keep track of the instructions about where and how to use these. Pharmacies typically print the medication instructions only on the boxes and not directly on the medication tubes.   If your medication is too expensive, please contact our office at [phone] option 4 or send [place] a message through MyChart.   We are unable to tell what your co-pay for medications will be in advance as this is different depending on your insurance coverage. However, we may be able to find a substitute medication at lower cost or fill out paperwork to get insurance to cover a needed medication.   If a prior authorization is required to get your medication covered by your insurance company, please allow [place] 1-2 business days to complete this process.  Drug prices often vary depending on where the prescription is filled and some pharmacies may offer cheaper prices.  The website www.goodrx.com contains coupons for medications through different pharmacies. The prices here do not account for what the cost may be with help from insurance (it may be cheaper with your insurance), but the website can give you the price if you did not use any insurance.  - You can print the associated coupon and take it with your prescription to the pharmacy.  - You may also stop by our office during regular business hours and pick up a GoodRx coupon card.  - If you need your prescription sent electronically to a different pharmacy, notify our office through [facility] or by phone at [phone] option 4.     Si Usted Necesita Algo Despus de Su Visita  Tambin puede enviarnos un mensaje a travs de Clinical cytogeneticist. Por lo general  respondemos a los mensajes de MyChart en el transcurso de 1 a 2 das hbiles.  Para renovar recetas, por favor pida a su farmacia que se ponga en contacto con nuestra oficina. Annie Sable de fax es [place] [phone].  Si tiene un asunto urgente cuando la clnica est cerrada y que no puede esperar hasta el siguiente da hbil, puede llamar/localizar a su doctor(a) al nmero que aparece a continuacin.   Por favor, tenga en cuenta que aunque hacemos todo lo posible para estar disponibles para asuntos urgentes fuera del horario de [place], no estamos disponibles las 24 horas del da, los 7 [address]  [address] de la [place].   Si tiene un problema urgente y no puede comunicarse con nosotros, puede optar por buscar atencin mdica  en el consultorio de su doctor(a), en una clnica privada, en un centro de atencin urgente o en una sala de emergencias.  Si tiene una emergencia mdica, por favor llame inmediatamente al 911 o  vaya a la sala de emergencias.  Nmeros de bper  - Dr. Gwen Pounds: [phone]  - Dra. Roseanne Reno: [phone]  En caso de inclemencias del [place], por favor llame a Lacy Duverney principal al [phone] para una actualizacin sobre el [place] de cualquier retraso o cierre.  Consejos para la medicacin en dermatologa: Por favor, guarde las cajas en las que vienen los medicamentos de uso tpico para ayudarle a seguir las instrucciones sobre dnde y cmo usarlos. Las farmacias generalmente imprimen las instrucciones del medicamento slo en las cajas y no directamente en los tubos del [place].   Si su medicamento es muy caro, por favor, pngase en contacto con Rolm Gala llamando al [phone] y presione la opcin 4 o envenos un mensaje a travs de Clinical cytogeneticist.   No podemos decirle cul ser su copago por los medicamentos por adelantado ya que esto es diferente dependiendo de la cobertura de su seguro. Sin embargo, es posible que podamos encontrar un medicamento sustituto a Advice worker un formulario para que el seguro cubra el medicamento que se considera necesario.   Si se requiere una autorizacin previa para que su compaa de seguros [place] su medicamento, por favor permtanos de 1 a 2 das hbiles para completar [address]  Los precios de los medicamentos varan con frecuencia dependiendo del Environmental consultant de dnde se surte la receta y alguna farmacias pueden ofrecer precios ms baratos.  El sitio web www.goodrx.com tiene cupones para medicamentos de Health and safety inspector. Los precios aqu no tienen en cuenta lo que podra costar con la ayuda del seguro (puede ser ms barato con su seguro), pero el sitio web puede darle el precio si no utiliz Tourist information centre manager.  - Puede imprimir el cupn correspondiente y llevarlo con su receta a la farmacia.  - Tambin puede pasar por nuestra oficina durante el horario de atencin regular y Education officer, museum una tarjeta de cupones de GoodRx.  - Si necesita que su receta se enve electrnicamente a una farmacia diferente, informe a nuestra oficina a travs de MyChart de [place] o por telfono llamando al [phone] y presione la opcin 4.

## 2022-12-19 ENCOUNTER — Telehealth: Payer: Self-pay

## 2022-12-19 NOTE — Telephone Encounter (Signed)
Called patient. N/A. LMOVM to C/B 

## 2022-12-19 NOTE — Telephone Encounter (Signed)
-----   Message from Penelope sent at 12/18/2022  8:08 PM EDT ----- Diagnosis: Skin , left elbow - posterior WELL DIFFERENTIATED SQUAMOUS CELL CARCINOMA WITH SUPERFICIAL INFILTRATION, ULCERATED  Please call to share diagnosis and offer excision only. If patient prefers to avoid excision, offer EDC with the caveat that there is a high risk of recurrence and the need for subsequent excision.  Explanation: This is a squamous cell skin cancer that has grown beyond the surface of the skin and is invading the second layer of the skin. It has the potential to spread beyond the skin and threaten your health, so I recommend treating it.  Treatment option 1: you return for an hour long appointment where we perform a skin surgery. We numb the site of the skin cancer and a safety margin of normal skin around it. We remove the full thickness of skin and close the wound with two layers of stitches. The sample is sent to the lab to check that the skin cancer was fully removed. Return one week later to have wound checked and surface stitches removed. Surgical wound leaves a line scar. Approximately 95% cure rate. Risk of recurrence, bleeding, infection, pain, injury to nearby structures, hypertrophic scar.  Alternative (not recommended): you return for a 15 minute appointment where we perform electrodesiccation and curettage [facility]). This involves three rounds of scraping and burning to destroy the skin cancer. It leaves a round wound slightly larger than the skin cancer and leaves a round white scar. No additional pathology is done. If the skin cancer comes back, we would need to do a surgery to remove it.

## 2023-01-03 ENCOUNTER — Other Ambulatory Visit: Payer: Self-pay | Admitting: Cardiovascular Disease

## 2023-01-14 ENCOUNTER — Ambulatory Visit: Payer: PPO | Admitting: Dermatology

## 2023-01-29 ENCOUNTER — Ambulatory Visit: Payer: PPO | Admitting: Dermatology

## 2023-02-03 ENCOUNTER — Other Ambulatory Visit: Payer: Self-pay | Admitting: Cardiovascular Disease

## 2023-02-04 ENCOUNTER — Ambulatory Visit: Payer: PPO | Admitting: Dermatology

## 2023-02-19 ENCOUNTER — Telehealth: Payer: Self-pay

## 2023-02-19 NOTE — Telephone Encounter (Signed)
-----  Message from [place] sent at 12/18/2022  8:08 PM EDT ----- Diagnosis: Skin , left elbow - posterior WELL DIFFERENTIATED SQUAMOUS CELL CARCINOMA WITH SUPERFICIAL INFILTRATION, ULCERATED  Please call to share diagnosis and offer excision only. If patient prefers to avoid excision, offer EDC with the caveat that there is a high risk of recurrence and the need for subsequent excision.  Explanation: This is a squamous cell skin cancer that has grown beyond the surface of the skin and is invading the second layer of the skin. It has the potential to spread beyond the skin and threaten your health, so I recommend treating it.  Treatment option 1: you return for an hour long appointment where we perform a skin surgery. We numb the site of the skin cancer and a safety margin of normal skin around it. We remove the full thickness of skin and close the wound with two layers of stitches. The sample is sent to the lab to check that the skin cancer was fully removed. Return one week later to have wound checked and surface stitches removed. Surgical wound leaves a line scar. Approximately 95% cure rate. Risk of recurrence, bleeding, infection, pain, injury to nearby structures, hypertrophic scar.  Alternative (not recommended): you return for a 15 minute appointment where we perform electrodesiccation and curettage [facility]). This involves three rounds of scraping and burning to destroy the skin cancer. It leaves a round wound slightly larger than the skin cancer and leaves a round white scar. No additional pathology is done. If the skin cancer comes back, we would need to do a surgery to remove it.

## 2023-02-19 NOTE — Telephone Encounter (Signed)
Left pt msg to call for bx results/sh 

## 2023-02-26 ENCOUNTER — Ambulatory Visit: Payer: PPO | Admitting: Dermatology

## 2023-02-26 DIAGNOSIS — W908XXA Exposure to other nonionizing radiation, initial encounter: Secondary | ICD-10-CM | POA: Diagnosis not present

## 2023-02-26 DIAGNOSIS — Z85828 Personal history of other malignant neoplasm of skin: Secondary | ICD-10-CM

## 2023-02-26 DIAGNOSIS — D492 Neoplasm of unspecified behavior of bone, soft tissue, and skin: Secondary | ICD-10-CM

## 2023-02-26 DIAGNOSIS — Z7189 Other specified counseling: Secondary | ICD-10-CM

## 2023-02-26 DIAGNOSIS — C4492 Squamous cell carcinoma of skin, unspecified: Secondary | ICD-10-CM

## 2023-02-26 DIAGNOSIS — Z5111 Encounter for antineoplastic chemotherapy: Secondary | ICD-10-CM

## 2023-02-26 DIAGNOSIS — L578 Other skin changes due to chronic exposure to nonionizing radiation: Secondary | ICD-10-CM

## 2023-02-26 DIAGNOSIS — L570 Actinic keratosis: Secondary | ICD-10-CM

## 2023-02-26 NOTE — Progress Notes (Unsigned)
Follow-Up Visit   Subjective  Joshua MACFARLAND Sr. is a 67 y.o. male who presents for the following: Actinic keratosis.  The patient has spots, moles and lesions to be evaluated, some may be new or changing and the patient may have concern these could be cancer.  Patient did field treatment on scalp and forehead. Had a reaction and skin is now smoother. Patient prefers to avoid excision for SCC on left elbow   The following portions of the chart were reviewed this encounter and updated as appropriate: medications, allergies, medical history  Review of Systems:  No other skin or systemic complaints except as noted in HPI or Assessment and Plan.  Objective  Well appearing patient in no apparent distress; mood and affect are within normal limits.  A focused examination was performed of the following areas: Head neck forearms  Relevant exam findings are noted in the Assessment and Plan.    Assessment & Plan   Actinic elastosis  Related Medications fluorouracil (EFUDEX) 5 % cream Apply topically 2 (two) times daily. Apply the cream twice per day to precancers until redness and irritation develop (usually occurs by day 7), then stop and allow it to heal. Protect the area from sunlight while it is healing with a hat or SPF30+ sunscreen.  Actinic keratoses  Related Medications fluorouracil (EFUDEX) 5 % cream Apply topically 2 (two) times daily. Apply the cream twice per day to precancers until redness and irritation develop (usually occurs by day 7), then stop and allow it to heal. Protect the area from sunlight while it is healing with a hat or SPF30+ sunscreen.  SCCA (squamous cell carcinoma) of skin   NUMEROUS LESIONS CONCERNING FOR SCC Exam: keratotic pink papules and plaques on: R lat forehead Post vertex R postauricular scalp L elbow (already biopsied) L distal forearm L mid forearm R mid forearm  Plan: Given number of lesions, treating all with excision would be  difficult. Patient prefers to avoid excision. Discussed option of biopsy, ED&C, and IL5FU. Risk of recurrence, nausea, headache, skin irritation. Patient opts for [facility] with IL5FU Patient will return for surgery slot to have lesions treatment Will also treat biopsied SCC on left elbow  ACTINIC DAMAGE WITH PRECANCEROUS ACTINIC KERATOSES Counseling for Topical Chemotherapy Management: Patient exhibits: - Exam: Severe, confluent actinic changes with pre-cancerous actinic keratoses that is secondary to cumulative UV radiation exposure over time - Condition that is severe; chronic, not at goal. - diffuse scaly erythematous macules and papules with underlying dyspigmentation - Discussed Prescription "Field Treatment" topical Chemotherapy for Severe, Chronic Confluent Actinic Changes with Pre-Cancerous Actinic Keratoses Field treatment involves treatment of an entire area of skin that has confluent Actinic Changes (Sun/ Ultraviolet light damage) and PreCancerous Actinic Keratoses by method of PhotoDynamic Therapy (PDT) and/or prescription Topical Chemotherapy agents such as 5-fluorouracil, 5-fluorouracil/calcipotriene, and/or imiquimod.  The purpose is to decrease the number of clinically evident and subclinical PreCancerous lesions to prevent progression to development of skin cancer by chemically destroying early precancer changes that may or may not be visible.  It has been shown to reduce the risk of developing skin cancer in the treated area. As a result of treatment, redness, scaling, crusting, and open sores may occur during treatment course. One or more than one of these methods may be used and may have to be used several times to control, suppress and eliminate the PreCancerous changes. Discussed treatment course, expected reaction, and possible side effects. - Recommend daily broad spectrum sunscreen SPF  30+ to sun-exposed areas, reapply every 2 hours as needed.  - Staying in the shade or wearing long  sleeves, sun glasses (UVA+UVB protection) and wide brim hats (4-inch brim around the entire circumference of the hat) are also recommended. -Sent [place] 5FU/calcipotriene for BID application until reaction develops on the following areas: R eyebrow R temple R periocular L temple L lateral eyebrow B/L helicis  B/L forearms  HISTORY OF SQUAMOUS CELL CARCINOMA. Left elbow. Bx 12/12/2022. - No evidence of recurrence today, but excision was recommended given the possibility of microscopic persistence of malignancy. Patient prefers to avoid excision - given patient preference, discussed option of ED&C - Recommend regular full body skin exams - Recommend daily broad spectrum sunscreen SPF 30+ to sun-exposed areas, reapply every 2 hours as needed.  - Call if any new or changing lesions are noted between office visit  Return for Biopsy, EDC and 5FU injections in a surgery slot.    Documentation: I have reviewed the above documentation for accuracy and completeness, and I agree with the above.  Elie Goody, MD

## 2023-02-28 ENCOUNTER — Encounter: Payer: Self-pay | Admitting: Dermatology

## 2023-02-28 MED ORDER — FLUOROURACIL 5 % EX CREA
TOPICAL_CREAM | Freq: Two times a day (BID) | CUTANEOUS | 3 refills | Status: DC
Start: 2023-02-28 — End: 2023-12-23

## 2023-03-06 ENCOUNTER — Other Ambulatory Visit: Payer: Self-pay | Admitting: Cardiovascular Disease

## 2023-03-12 MED ORDER — BUPRENORPHINE HCL 8 MG SUBLINGUAL TABLET
ORAL_TABLET | 2 refills | 0 days
Start: 2023-03-12 — End: 2023-06-06

## 2023-03-13 ENCOUNTER — Ambulatory Visit: Admit: 2023-03-13 | Payer: PRIVATE HEALTH INSURANCE

## 2023-03-15 ENCOUNTER — Ambulatory Visit: Admit: 2023-03-15 | Discharge: 2023-03-16 | Payer: PRIVATE HEALTH INSURANCE

## 2023-03-15 DIAGNOSIS — E1142 Type 2 diabetes mellitus with diabetic polyneuropathy: Principal | ICD-10-CM

## 2023-03-15 DIAGNOSIS — R058 Sputum production: Principal | ICD-10-CM

## 2023-03-15 DIAGNOSIS — F17200 Nicotine dependence, unspecified, uncomplicated: Principal | ICD-10-CM

## 2023-03-15 DIAGNOSIS — Z79899 Other long term (current) drug therapy: Principal | ICD-10-CM

## 2023-03-15 DIAGNOSIS — F1190 Opioid use, unspecified, uncomplicated: Principal | ICD-10-CM

## 2023-03-15 MED ORDER — PREGABALIN 50 MG CAPSULE
ORAL_CAPSULE | Freq: Three times a day (TID) | ORAL | 5 refills | 30 days | Status: CP
Start: 2023-03-15 — End: 2023-09-11

## 2023-03-15 MED ORDER — BUPRENORPHINE HCL 8 MG SUBLINGUAL TABLET
ORAL_TABLET | 2 refills | 0 days | Status: CP
Start: 2023-03-15 — End: 2023-06-09

## 2023-03-15 MED ORDER — NALOXONE 4 MG/ACTUATION NASAL SPRAY
0 refills | 0 days | Status: CP
Start: 2023-03-15 — End: ?

## 2023-04-08 ENCOUNTER — Other Ambulatory Visit: Payer: Self-pay | Admitting: Cardiovascular Disease

## 2023-04-17 ENCOUNTER — Encounter: Payer: Self-pay | Admitting: Dermatology

## 2023-04-17 ENCOUNTER — Ambulatory Visit: Payer: PPO | Admitting: Dermatology

## 2023-04-17 DIAGNOSIS — L570 Actinic keratosis: Secondary | ICD-10-CM

## 2023-04-17 DIAGNOSIS — D492 Neoplasm of unspecified behavior of bone, soft tissue, and skin: Secondary | ICD-10-CM

## 2023-04-17 DIAGNOSIS — D485 Neoplasm of uncertain behavior of skin: Secondary | ICD-10-CM

## 2023-04-17 DIAGNOSIS — C44222 Squamous cell carcinoma of skin of right ear and external auricular canal: Secondary | ICD-10-CM | POA: Diagnosis not present

## 2023-04-17 DIAGNOSIS — L858 Other specified epidermal thickening: Secondary | ICD-10-CM

## 2023-04-17 DIAGNOSIS — C4442 Squamous cell carcinoma of skin of scalp and neck: Secondary | ICD-10-CM

## 2023-04-17 DIAGNOSIS — C44329 Squamous cell carcinoma of skin of other parts of face: Secondary | ICD-10-CM

## 2023-04-17 DIAGNOSIS — D0462 Carcinoma in situ of skin of left upper limb, including shoulder: Secondary | ICD-10-CM | POA: Diagnosis not present

## 2023-04-17 DIAGNOSIS — C44622 Squamous cell carcinoma of skin of right upper limb, including shoulder: Secondary | ICD-10-CM

## 2023-04-17 DIAGNOSIS — C4492 Squamous cell carcinoma of skin, unspecified: Secondary | ICD-10-CM

## 2023-04-17 DIAGNOSIS — C44629 Squamous cell carcinoma of skin of left upper limb, including shoulder: Secondary | ICD-10-CM

## 2023-04-17 HISTORY — DX: Actinic keratosis: L57.0

## 2023-04-17 NOTE — Progress Notes (Unsigned)
Follow-Up Visit   Subjective  Joshua HITCHINS Sr. is a 67 y.o. male who presents for the following: biopsy, EDC and 5FU injections.   The patient has spots, moles and lesions to be evaluated, some may be new or changing and the patient may have concern these could be cancer.   The following portions of the chart were reviewed this encounter and updated as appropriate: medications, allergies, medical history  Review of Systems:  No other skin or systemic complaints except as noted in HPI or Assessment and Plan.  Objective  Well appearing patient in no apparent distress; mood and affect are within normal limits.   A focused examination was performed of the following areas: Arms, scalp, face and ears  Relevant exam findings are noted in the Assessment and Plan.  right lateral forehead - C 9 mm keratotic papule     right inferior helix - D 3 mm keratotic papule     right temporal scalp - A 8 mm keratotic papule     right postauricular scalp - B 10 mm keratotic papule     posterior vertex - E 8 mm keratotic papule     left distal forearm - G 6 mm keratotic papule     left mid forearm - F 9 mm keratotic papule     right medial mid forearm - H 11 mm keratotic papule     Left Elbow 12 mm well healed scar    Assessment & Plan     Neoplasm of uncertain behavior of skin (8) right lateral forehead - C  Skin / nail biopsy Type of biopsy: tangential   Informed consent: discussed and consent obtained   Timeout: patient name, date of birth, surgical site, and procedure verified   Procedure prep:  Patient was prepped and draped in usual sterile fashion Prep type:  Isopropyl alcohol Anesthesia: the lesion was anesthetized in a standard fashion   Anesthetic:  1% lidocaine w/ epinephrine 1-100,000 buffered w/ 8.4% NaHCO3 Instrument used: DermaBlade   Hemostasis achieved with: pressure and aluminum chloride   Outcome: patient tolerated  procedure well   Post-procedure details: sterile dressing applied and wound care instructions given   Dressing type: bandage and petrolatum    Destruction of lesion  Destruction method: electrodesiccation and curettage   Informed consent: discussed and consent obtained   Timeout:  patient name, date of birth, surgical site, and procedure verified Anesthesia: the lesion was anesthetized in a standard fashion   Anesthetic:  1% lidocaine w/ epinephrine 1-100,000 buffered w/ 8.4% NaHCO3 Curettage performed in three different directions: Yes   Electrodesiccation performed over the curetted area: Yes   Curettage cycles:  3 Hemostasis achieved with:  electrodesiccation Outcome: patient tolerated procedure well with no complications   Post-procedure details: sterile dressing applied and wound care instructions given   Dressing type: petrolatum    Intralesional injection Location: right lateral forehead  Informed Consent: Discussed risks (infection, pain, bleeding, bruising, thinning of the skin, loss of skin pigment, lack of resolution, and recurrence of lesion) and benefits of the procedure, as well as the alternatives. Informed consent was obtained. Preparation: The area was prepared a standard fashion.  Procedure Details: An intralesional injection was performed with 5-fluorouracil. 0.2 cc in total were injected.  Total number of injections: 1  Plan: The patient was instructed on post-op care. Recommend OTC analgesia as needed for pain.   Specimen 1 - Surgical pathology Differential Diagnosis: r/o SCC  Check Margins: No 9 mm  keratotic papule EDC  right inferior helix - D  Skin / nail biopsy Type of biopsy: tangential   Informed consent: discussed and consent obtained   Timeout: patient name, date of birth, surgical site, and procedure verified   Procedure prep:  Patient was prepped and draped in usual sterile fashion Prep type:  Isopropyl alcohol Anesthesia: the lesion was  anesthetized in a standard fashion   Anesthetic:  1% lidocaine w/ epinephrine 1-100,000 buffered w/ 8.4% NaHCO3 Instrument used: DermaBlade   Hemostasis achieved with: pressure and aluminum chloride   Outcome: patient tolerated procedure well   Post-procedure details: sterile dressing applied and wound care instructions given   Dressing type: bandage and petrolatum    Specimen 2 - Surgical pathology Differential Diagnosis: r/o SCC  Check Margins: No 3 mm keratotic papule  right temporal scalp - A  Skin / nail biopsy Type of biopsy: tangential   Informed consent: discussed and consent obtained   Timeout: patient name, date of birth, surgical site, and procedure verified   Procedure prep:  Patient was prepped and draped in usual sterile fashion Prep type:  Isopropyl alcohol Anesthesia: the lesion was anesthetized in a standard fashion   Anesthetic:  1% lidocaine w/ epinephrine 1-100,000 buffered w/ 8.4% NaHCO3 Instrument used: DermaBlade   Hemostasis achieved with: pressure and aluminum chloride   Outcome: patient tolerated procedure well   Post-procedure details: sterile dressing applied and wound care instructions given   Dressing type: bandage and petrolatum    Destruction of lesion  Destruction method: electrodesiccation and curettage   Informed consent: discussed and consent obtained   Timeout:  patient name, date of birth, surgical site, and procedure verified Anesthesia: the lesion was anesthetized in a standard fashion   Anesthetic:  1% lidocaine w/ epinephrine 1-100,000 buffered w/ 8.4% NaHCO3 Curettage performed in three different directions: Yes   Electrodesiccation performed over the curetted area: Yes   Curettage cycles:  3 Hemostasis achieved with:  electrodesiccation Outcome: patient tolerated procedure well with no complications   Post-procedure details: sterile dressing applied and wound care instructions given   Dressing type: petrolatum    Intralesional  injection Location: right temporal scalp  Informed Consent: Discussed risks (infection, pain, bleeding, bruising, thinning of the skin, loss of skin pigment, lack of resolution, and recurrence of lesion) and benefits of the procedure, as well as the alternatives. Informed consent was obtained. Preparation: The area was prepared a standard fashion.  Procedure Details: An intralesional injection was performed with 5-fluorouracil. 0.2 cc in total were injected.  Total number of injections: 1  Plan: The patient was instructed on post-op care. Recommend OTC analgesia as needed for pain.   Specimen 3 - Surgical pathology Differential Diagnosis: r/o SCC  Check Margins: No 8 mm keratotic papule EDC  right postauricular scalp - B  Skin / nail biopsy Type of biopsy: tangential   Informed consent: discussed and consent obtained   Timeout: patient name, date of birth, surgical site, and procedure verified   Procedure prep:  Patient was prepped and draped in usual sterile fashion Prep type:  Isopropyl alcohol Anesthesia: the lesion was anesthetized in a standard fashion   Anesthetic:  1% lidocaine w/ epinephrine 1-100,000 buffered w/ 8.4% NaHCO3 Instrument used: DermaBlade   Hemostasis achieved with: pressure and aluminum chloride   Outcome: patient tolerated procedure well   Post-procedure details: sterile dressing applied and wound care instructions given   Dressing type: bandage and petrolatum    Destruction of lesion  Destruction method:  electrodesiccation and curettage   Informed consent: discussed and consent obtained   Timeout:  patient name, date of birth, surgical site, and procedure verified Anesthesia: the lesion was anesthetized in a standard fashion   Anesthetic:  1% lidocaine w/ epinephrine 1-100,000 buffered w/ 8.4% NaHCO3 Curettage performed in three different directions: Yes   Electrodesiccation performed over the curetted area: Yes   Curettage cycles:  3 Hemostasis  achieved with:  electrodesiccation Outcome: patient tolerated procedure well with no complications   Post-procedure details: sterile dressing applied and wound care instructions given   Dressing type: petrolatum    Intralesional injection Location: right postauricular scalp  Informed Consent: Discussed risks (infection, pain, bleeding, bruising, thinning of the skin, loss of skin pigment, lack of resolution, and recurrence of lesion) and benefits of the procedure, as well as the alternatives. Informed consent was obtained. Preparation: The area was prepared a standard fashion.  Procedure Details: An intralesional injection was performed with 5-fluorouracil. 0.2 cc in total were injected.  Total number of injections: 1  Plan: The patient was instructed on post-op care. Recommend OTC analgesia as needed for pain.   Specimen 4 - Surgical pathology Differential Diagnosis: r/o SCC  Check Margins: No 10 mm keratotic papule EDC  posterior vertex - E  Skin / nail biopsy Type of biopsy: tangential   Informed consent: discussed and consent obtained   Timeout: patient name, date of birth, surgical site, and procedure verified   Procedure prep:  Patient was prepped and draped in usual sterile fashion Prep type:  Isopropyl alcohol Anesthesia: the lesion was anesthetized in a standard fashion   Anesthetic:  1% lidocaine w/ epinephrine 1-100,000 buffered w/ 8.4% NaHCO3 Instrument used: DermaBlade   Hemostasis achieved with: pressure and aluminum chloride   Outcome: patient tolerated procedure well   Post-procedure details: sterile dressing applied and wound care instructions given   Dressing type: bandage and petrolatum    Destruction of lesion  Destruction method: electrodesiccation and curettage   Informed consent: discussed and consent obtained   Timeout:  patient name, date of birth, surgical site, and procedure verified Anesthesia: the lesion was anesthetized in a standard fashion    Anesthetic:  1% lidocaine w/ epinephrine 1-100,000 buffered w/ 8.4% NaHCO3 Curettage performed in three different directions: Yes   Electrodesiccation performed over the curetted area: Yes   Curettage cycles:  3 Hemostasis achieved with:  electrodesiccation Outcome: patient tolerated procedure well with no complications   Post-procedure details: sterile dressing applied and wound care instructions given   Dressing type: petrolatum    Intralesional injection Location: posterior vertex  Informed Consent: Discussed risks (infection, pain, bleeding, bruising, thinning of the skin, loss of skin pigment, lack of resolution, and recurrence of lesion) and benefits of the procedure, as well as the alternatives. Informed consent was obtained. Preparation: The area was prepared a standard fashion.  Procedure Details: An intralesional injection was performed with 5-fluorouracil. 0.2 cc in total were injected.  Total number of injections: 1  Plan: The patient was instructed on post-op care. Recommend OTC analgesia as needed for pain.   Specimen 5 - Surgical pathology Differential Diagnosis: r/o SCC  Check Margins: No 8 mm keratotic papule EDC  left distal forearm - G  Skin / nail biopsy Type of biopsy: tangential   Informed consent: discussed and consent obtained   Timeout: patient name, date of birth, surgical site, and procedure verified   Procedure prep:  Patient was prepped and draped in usual sterile fashion Prep type:  Isopropyl alcohol Anesthesia: the lesion was anesthetized in a standard fashion   Anesthetic:  1% lidocaine w/ epinephrine 1-100,000 buffered w/ 8.4% NaHCO3 Instrument used: DermaBlade   Hemostasis achieved with: pressure and aluminum chloride   Outcome: patient tolerated procedure well   Post-procedure details: sterile dressing applied and wound care instructions given   Dressing type: bandage and petrolatum    Destruction of lesion  Destruction method:  electrodesiccation and curettage   Informed consent: discussed and consent obtained   Timeout:  patient name, date of birth, surgical site, and procedure verified Anesthesia: the lesion was anesthetized in a standard fashion   Anesthetic:  1% lidocaine w/ epinephrine 1-100,000 buffered w/ 8.4% NaHCO3 Curettage performed in three different directions: Yes   Electrodesiccation performed over the curetted area: Yes   Curettage cycles:  3 Hemostasis achieved with:  electrodesiccation Outcome: patient tolerated procedure well with no complications   Post-procedure details: sterile dressing applied and wound care instructions given   Dressing type: petrolatum    Intralesional injection Location: left distal forearm  Informed Consent: Discussed risks (infection, pain, bleeding, bruising, thinning of the skin, loss of skin pigment, lack of resolution, and recurrence of lesion) and benefits of the procedure, as well as the alternatives. Informed consent was obtained. Preparation: The area was prepared a standard fashion.  Procedure Details: An intralesional injection was performed with 5-fluorouracil. 0.2 cc in total were injected.  Total number of injections: 1  Plan: The patient was instructed on post-op care. Recommend OTC analgesia as needed for pain.   Specimen 6 - Surgical pathology Differential Diagnosis: r/o SCC  Check Margins: No 6 mm keratotic papule EDC  left mid forearm - F  Skin / nail biopsy Type of biopsy: tangential   Informed consent: discussed and consent obtained   Timeout: patient name, date of birth, surgical site, and procedure verified   Procedure prep:  Patient was prepped and draped in usual sterile fashion Prep type:  Isopropyl alcohol Anesthesia: the lesion was anesthetized in a standard fashion   Anesthetic:  1% lidocaine w/ epinephrine 1-100,000 buffered w/ 8.4% NaHCO3 Instrument used: DermaBlade   Hemostasis achieved with: pressure and aluminum  chloride   Outcome: patient tolerated procedure well   Post-procedure details: sterile dressing applied and wound care instructions given   Dressing type: bandage and petrolatum    Destruction of lesion  Destruction method: electrodesiccation and curettage   Informed consent: discussed and consent obtained   Timeout:  patient name, date of birth, surgical site, and procedure verified Anesthesia: the lesion was anesthetized in a standard fashion   Anesthetic:  1% lidocaine w/ epinephrine 1-100,000 buffered w/ 8.4% NaHCO3 Curettage performed in three different directions: Yes   Electrodesiccation performed over the curetted area: Yes   Curettage cycles:  3 Hemostasis achieved with:  electrodesiccation Outcome: patient tolerated procedure well with no complications   Post-procedure details: sterile dressing applied and wound care instructions given   Dressing type: petrolatum    Intralesional injection Location: left mid forearm  Informed Consent: Discussed risks (infection, pain, bleeding, bruising, thinning of the skin, loss of skin pigment, lack of resolution, and recurrence of lesion) and benefits of the procedure, as well as the alternatives. Informed consent was obtained. Preparation: The area was prepared a standard fashion.  Procedure Details: An intralesional injection was performed with 5-fluorouracil. 0.2 cc in total were injected.  Total number of injections: 1  Plan: The patient was instructed on post-op care. Recommend OTC analgesia as needed for  pain.   Specimen 7 - Surgical pathology Differential Diagnosis: r/o SCC  Check Margins: No 9 mm keratotic papule EDC  right medial mid forearm - H  Skin / nail biopsy Type of biopsy: tangential   Informed consent: discussed and consent obtained   Timeout: patient name, date of birth, surgical site, and procedure verified   Procedure prep:  Patient was prepped and draped in usual sterile fashion Prep type:  Isopropyl  alcohol Anesthesia: the lesion was anesthetized in a standard fashion   Anesthetic:  1% lidocaine w/ epinephrine 1-100,000 buffered w/ 8.4% NaHCO3 Instrument used: DermaBlade   Hemostasis achieved with: pressure and aluminum chloride   Outcome: patient tolerated procedure well   Post-procedure details: sterile dressing applied and wound care instructions given   Dressing type: bandage and petrolatum    Destruction of lesion  Destruction method: electrodesiccation and curettage   Informed consent: discussed and consent obtained   Timeout:  patient name, date of birth, surgical site, and procedure verified Anesthesia: the lesion was anesthetized in a standard fashion   Anesthetic:  1% lidocaine w/ epinephrine 1-100,000 buffered w/ 8.4% NaHCO3 Curettage performed in three different directions: Yes   Electrodesiccation performed over the curetted area: Yes   Curettage cycles:  3 Hemostasis achieved with:  electrodesiccation Outcome: patient tolerated procedure well with no complications   Post-procedure details: sterile dressing applied and wound care instructions given   Dressing type: petrolatum    Intralesional injection Location: right medial mid forearm  Informed Consent: Discussed risks (infection, pain, bleeding, bruising, thinning of the skin, loss of skin pigment, lack of resolution, and recurrence of lesion) and benefits of the procedure, as well as the alternatives. Informed consent was obtained. Preparation: The area was prepared a standard fashion.  Procedure Details: An intralesional injection was performed with 5-fluorouracil. 0.3 cc in total were injected.  Total number of injections: 1  Plan: The patient was instructed on post-op care. Recommend OTC analgesia as needed for pain.   Specimen 8 - Surgical pathology Differential Diagnosis: r/o SCC  Check Margins: No 11 mm keratotic papule EDC  Squamous cell carcinoma of skin Left Elbow  Destruction of  lesion  Destruction method: electrodesiccation and curettage   Informed consent: discussed and consent obtained   Timeout:  patient name, date of birth, surgical site, and procedure verified Anesthesia: the lesion was anesthetized in a standard fashion   Anesthetic:  1% lidocaine w/ epinephrine 1-100,000 buffered w/ 8.4% NaHCO3 Curettage performed in three different directions: Yes   Electrodesiccation performed over the curetted area: Yes   Curettage cycles:  3 Hemostasis achieved with:  electrodesiccation Outcome: patient tolerated procedure well with no complications   Post-procedure details: sterile dressing applied and wound care instructions given   Dressing type: petrolatum    Intralesional injection Location: left elbow  Informed Consent: Discussed risks (infection, pain, bleeding, bruising, thinning of the skin, loss of skin pigment, lack of resolution, and recurrence of lesion) and benefits of the procedure, as well as the alternatives. Informed consent was obtained. Preparation: The area was prepared a standard fashion.  Procedure Details: An intralesional injection was performed with 5-fluorouracil. 0.2 cc in total were injected.  Total number of injections: 1  Plan: The patient was instructed on post-op care. Recommend OTC analgesia as needed for pain.   Biopsy proven    Return in about 4 weeks (around 05/15/2023).  Anise Salvo, RMA, am acting as scribe for Elie Goody, MD .   Documentation: I have reviewed  the above documentation for accuracy and completeness, and I agree with the above.  Elie Goody, MD

## 2023-04-17 NOTE — Patient Instructions (Signed)

## 2023-04-23 LAB — SURGICAL PATHOLOGY

## 2023-04-25 ENCOUNTER — Telehealth: Payer: Self-pay | Admitting: Dermatology

## 2023-04-25 ENCOUNTER — Encounter: Payer: Self-pay | Admitting: Dermatology

## 2023-04-25 NOTE — Telephone Encounter (Signed)
Called but got voice mail

## 2023-05-06 ENCOUNTER — Telehealth: Payer: Self-pay

## 2023-05-06 NOTE — Telephone Encounter (Signed)
-----   Message from [facility] sent at 04/27/2023  9:36 AM EST ----- Diagnosis: 2. Skin, right inferior helix :       WELL DIFFERENTIATED SQUAMOUS CELL CARCINOMA, DEEP MARGIN INVOLVED   ##this cancer was just biopsied and not treated on 04/17/23##  Please call with diagnosis and determine where the patient would like to have Mohs surgery.  Explanation: This is a squamous cell skin cancer that has grown beyond the surface of the skin and is invading the second layer of the skin. It has the potential to spread beyond the skin and threaten your health, so I recommend treating it.  Treatment: Given the location and type of skin cancer, I recommend Mohs surgery. Mohs surgery involves cutting out the skin cancer and then checking under the microscope to ensure the whole skin cancer was removed. If any skin cancer remains, the surgeon will cut out more until it is fully removed. The cure rate is about 98-99%. Once the Mohs surgeon confirms the skin cancer is out, they will discuss the options to repair or heal the area. You must take it easy for about two weeks after surgery (no lifting over 10-15 lbs, avoid activity to get your heart rate and blood pressure up). It is done at another office outside of [place] ([place], [place], or [place]).

## 2023-05-06 NOTE — Telephone Encounter (Signed)
Left pt msg to call about bx results./sh

## 2023-05-07 ENCOUNTER — Other Ambulatory Visit: Payer: Self-pay | Admitting: Cardiovascular Disease

## 2023-05-21 ENCOUNTER — Encounter: Payer: Self-pay | Admitting: Dermatology

## 2023-05-21 ENCOUNTER — Ambulatory Visit: Payer: PPO | Admitting: Dermatology

## 2023-05-21 DIAGNOSIS — Z85828 Personal history of other malignant neoplasm of skin: Secondary | ICD-10-CM

## 2023-05-21 DIAGNOSIS — D099 Carcinoma in situ, unspecified: Secondary | ICD-10-CM

## 2023-05-21 DIAGNOSIS — Z86007 Personal history of in-situ neoplasm of skin: Secondary | ICD-10-CM | POA: Diagnosis not present

## 2023-05-21 DIAGNOSIS — D0461 Carcinoma in situ of skin of right upper limb, including shoulder: Secondary | ICD-10-CM

## 2023-05-21 DIAGNOSIS — C4492 Squamous cell carcinoma of skin, unspecified: Secondary | ICD-10-CM

## 2023-05-21 DIAGNOSIS — D492 Neoplasm of unspecified behavior of bone, soft tissue, and skin: Secondary | ICD-10-CM | POA: Diagnosis not present

## 2023-05-21 DIAGNOSIS — L821 Other seborrheic keratosis: Secondary | ICD-10-CM | POA: Diagnosis not present

## 2023-05-21 DIAGNOSIS — L814 Other melanin hyperpigmentation: Secondary | ICD-10-CM

## 2023-05-21 DIAGNOSIS — L570 Actinic keratosis: Secondary | ICD-10-CM

## 2023-05-21 DIAGNOSIS — C44629 Squamous cell carcinoma of skin of left upper limb, including shoulder: Secondary | ICD-10-CM | POA: Diagnosis not present

## 2023-05-21 DIAGNOSIS — Z872 Personal history of diseases of the skin and subcutaneous tissue: Secondary | ICD-10-CM

## 2023-05-21 DIAGNOSIS — D485 Neoplasm of uncertain behavior of skin: Secondary | ICD-10-CM

## 2023-05-21 HISTORY — DX: Carcinoma in situ, unspecified: D09.9

## 2023-05-21 HISTORY — DX: Squamous cell carcinoma of skin, unspecified: C44.92

## 2023-05-21 NOTE — Progress Notes (Signed)
 Follow-Up Visit   Subjective  Joshua KURKA Sr. is a 68 y.o. male who presents for the following: follow up for skin cancers biopsied and treated  The patient has spots, moles and lesions to be evaluated, some may be new or changing and the patient may have concern these could be cancer.   The following portions of the chart were reviewed this encounter and updated as appropriate: medications, allergies, medical history  Review of Systems:  No other skin or systemic complaints except as noted in HPI or Assessment and Plan.  Objective  Well appearing patient in no apparent distress; mood and affect are within normal limits.   A focused examination was performed of the following areas: Arms, chest, face, scalp and back  Relevant exam findings are noted in the Assessment and Plan.  central chest 12 mm pigmented patch with central scar R/o MM vs SK  Left Dorsal Hand 8 mm keratotic papule R/o SCC  right extensor mid forearm 7 mm keratotic papule R/o SCC  R dorsal wrist x1, R forearm x3, L forearm x2, L temple x2, L mid cheek x1, L sup forehead x1, L inf helix x1, L mid helix x1, L lat canthus x1 (13), R temple x1, R sideburn x1, R sup helix x1, R mid helix x1, R post sup helix x1, R parietal scalp x1, anterior vertex x1, post vertex x3 (10) Erythematous thin papules/macules with gritty scale.  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 Patient opts to monitor - R postauricular scalp, bx well diff SCC, well healed scar - post vertex, bx well diff SCC adenoid, well healed scar - R medial mid forearm, bx well diff SCC, well healed scar - L elbow, bx well-diff SCC, ulcerated crusted plaque (healing wound), - No evidence of recurrence  today - Recommend regular full body skin exams - Recommend daily broad spectrum sunscreen SPF 30+ to sun-exposed areas, reapply every 2 hours as needed.  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 If no change in 1 month, will schedule excision. - L mid forearm, ulcerated crusted plaque (healing wound), no obvious evidence of remaining skin cancer. If no change in 1 month, will schedule excision. - No evidence of recurrence today - Recommend regular full body skin exams - Recommend daily broad spectrum sunscreen SPF 30+ to sun-exposed areas, reapply every 2 hours as needed.  - Call if any new or changing lesions are noted between office visits   NEOPLASM OF UNCERTAIN BEHAVIOR OF SKIN (3) central chest Skin / nail biopsy Type of biopsy: tangential   Informed consent: discussed and consent obtained   Timeout: patient name, date of birth, surgical site, and procedure verified   Procedure prep:  Patient was prepped and draped in usual sterile fashion Prep type:  Isopropyl alcohol Anesthesia: the lesion was anesthetized in a standard fashion   Anesthetic:  1% lidocaine  w/ epinephrine 1-100,000 buffered w/ 8.4% NaHCO3 Instrument used: DermaBlade  Hemostasis achieved with: pressure and aluminum chloride   Outcome: patient tolerated procedure well   Post-procedure details: sterile dressing applied and wound care instructions given   Dressing type: bandage and petrolatum   Specimen 1 - Surgical pathology Differential Diagnosis: R/o MM vs SK  Check Margins: No 12 mm pigmented patch with central scar  Left Dorsal Hand Skin / nail biopsy Type of biopsy: tangential   Informed consent: discussed and  consent obtained   Timeout: patient name, date of birth, surgical site, and procedure verified   Procedure prep:  Patient was prepped and draped in usual sterile fashion Prep type:  Isopropyl alcohol Anesthesia: the lesion was anesthetized in a standard fashion   Anesthetic:  1% lidocaine  w/ epinephrine 1-100,000 buffered w/ 8.4% NaHCO3 Instrument used: DermaBlade   Hemostasis achieved with: pressure and aluminum chloride   Outcome: patient tolerated procedure well   Post-procedure details: sterile dressing applied and wound care instructions given   Dressing type: bandage and petrolatum   Specimen 2 - Surgical pathology Differential Diagnosis: R/o SCC  Check Margins: No 8 mm keratotic papule  right extensor mid forearm Skin / nail biopsy Type of biopsy: tangential   Informed consent: discussed and consent obtained   Timeout: patient name, date of birth, surgical site, and procedure verified   Procedure prep:  Patient was prepped and draped in usual sterile fashion Prep type:  Isopropyl alcohol Anesthesia: the lesion was anesthetized in a standard fashion   Anesthetic:  1% lidocaine  w/ epinephrine 1-100,000 buffered w/ 8.4% NaHCO3 Instrument used: DermaBlade   Hemostasis achieved with: pressure and aluminum chloride   Outcome: patient tolerated procedure well   Post-procedure details: sterile dressing applied and wound care instructions given   Dressing type: bandage and petrolatum   Specimen 3 - Surgical pathology Differential Diagnosis: R/o SCC  Check Margins: No 7 mm keratotic papule  AK (ACTINIC KERATOSIS) (23) R dorsal wrist x1, R forearm x3, L forearm x2, L temple x2, L mid cheek x1, L sup forehead x1, L inf helix x1, L mid helix x1, L lat canthus x1 (13), R temple x1, R sideburn x1, R sup helix x1, R mid helix x1, R post sup helix x1, R parietal scalp x1, anterior vertex x1, post vertex x3 (10) Destruction of lesion - R dorsal wrist x1, R forearm x3, L forearm x2, L  temple x2, L mid cheek x1, L sup forehead x1, L inf helix x1, L mid helix x1, L lat canthus x1 (13), R temple x1, R sideburn x1, R sup helix x1, R mid helix x1, R post sup helix x1, R parietal scalp x1, anterior vertex x1, post vertex x3 (10) Complexity: simple   Destruction method: cryotherapy   Informed consent: discussed and consent obtained   Timeout:  patient name, date of birth, surgical site, and procedure verified Lesion destroyed using liquid nitrogen: Yes   Region frozen until ice ball extended beyond lesion: Yes   Cryo cycles: 1 or 2. Outcome: patient tolerated procedure well with no complications   Post-procedure details: wound care instructions given   LENTIGO Left Superior Forehead Recheck on follow up  Return in about 1 month (around 06/21/2023) for with Dr. Claudene recheck skin cancer at left forearm x 2, biopsies if indicated.  LILLETTE Lonell Drones, RMA, am acting as scribe for Boneta Claudene, MD .   Documentation: I have reviewed the above documentation for accuracy and completeness, and I agree with the above.  Boneta Claudene, MD

## 2023-05-21 NOTE — Patient Instructions (Signed)

## 2023-05-27 LAB — SURGICAL PATHOLOGY

## 2023-05-30 ENCOUNTER — Telehealth: Payer: Self-pay

## 2023-05-30 NOTE — Telephone Encounter (Signed)
-----   Message from [facility] sent at 05/28/2023  7:52 PM EST ----- Diagnosis: 1. Skin, central chest :       PIGMENTED SEBORRHEIC KERATOSIS        2. Skin, left dorsal hand :       WELL DIFFERENTIATED SQUAMOUS CELL CARCINOMA        3. Skin, right extensor mid forearm :       SQUAMOUS CELL CARCINOMA IN SITU    Please call with diagnosis and treatment options  Site 1 CHEST: benign thickening of top layer of skin. No treatment needed  Site 2: LEFT hand  Explanation: squamous cell skin cancer that has grown beyond the surface of the skin and is invading the second layer of the skin. It has the potential to spread beyond the skin and threaten your health, so I recommend treating it.  Treatment: Given the location and type of skin cancer, I recommend Mohs surgery. Mohs surgery involves cutting out the skin cancer and then checking under the microscope to ensure the whole skin cancer was removed. If any skin cancer remains, the surgeon will cut out more until it is fully removed. The cure rate is about 98-99%. Once the Mohs surgeon confirms the skin cancer is out, they will discuss the options to repair or heal the area. You must take it easy for about two weeks after surgery (no lifting over 10-15 lbs, avoid activity to get your heart rate and blood pressure up). It is done at another office outside of [place] ([place], [place], or [place]).  Site 3: RIGHT forearm  Explanation: squamous cell skin cancer limited to the top layer of skin  Treatment: ED&C, can be done at 2/24 appointment

## 2023-06-07 ENCOUNTER — Other Ambulatory Visit: Payer: Self-pay | Admitting: Cardiovascular Disease

## 2023-06-07 DIAGNOSIS — F411 Generalized anxiety disorder: Principal | ICD-10-CM

## 2023-06-07 DIAGNOSIS — K2100 Gastro-esophageal reflux disease with esophagitis, without bleeding: Secondary | ICD-10-CM

## 2023-06-07 MED ORDER — VENLAFAXINE ER 37.5 MG CAPSULE,EXTENDED RELEASE 24 HR
ORAL_CAPSULE | 3 refills | 0.00 days
Start: 2023-06-07 — End: ?

## 2023-06-08 MED ORDER — VENLAFAXINE ER 37.5 MG CAPSULE,EXTENDED RELEASE 24 HR
ORAL_CAPSULE | 3 refills | 0.00 days | Status: CP
Start: 2023-06-08 — End: ?

## 2023-06-16 MED ORDER — BUPRENORPHINE HCL 8 MG SUBLINGUAL TABLET
ORAL_TABLET | 2 refills | 0.00 days
Start: 2023-06-16 — End: 2023-09-10

## 2023-06-17 ENCOUNTER — Ambulatory Visit: Admit: 2023-06-17 | Discharge: 2023-06-18 | Payer: PRIVATE HEALTH INSURANCE

## 2023-06-17 DIAGNOSIS — E1142 Type 2 diabetes mellitus with diabetic polyneuropathy: Principal | ICD-10-CM

## 2023-06-17 DIAGNOSIS — H8110 Benign paroxysmal vertigo, unspecified ear: Principal | ICD-10-CM

## 2023-06-17 DIAGNOSIS — F1190 Opioid use, unspecified, uncomplicated: Principal | ICD-10-CM

## 2023-06-17 DIAGNOSIS — G4700 Insomnia, unspecified: Principal | ICD-10-CM

## 2023-06-17 DIAGNOSIS — Z79899 Other long term (current) drug therapy: Principal | ICD-10-CM

## 2023-06-17 MED ORDER — PREGABALIN 50 MG CAPSULE
ORAL_CAPSULE | Freq: Two times a day (BID) | ORAL | 5 refills | 30.00 days | Status: CP
Start: 2023-06-17 — End: 2023-12-14

## 2023-06-17 MED ORDER — BUPRENORPHINE HCL 8 MG SUBLINGUAL TABLET
ORAL_TABLET | 2 refills | 0.00 days | Status: CN
Start: 2023-06-17 — End: 2023-09-10

## 2023-06-17 MED ORDER — ZOLPIDEM 10 MG TABLET
ORAL_TABLET | 0 refills | 0.00 days | Status: CP
Start: 2023-06-17 — End: ?

## 2023-06-17 MED ORDER — DIAZEPAM 5 MG TABLET
ORAL_TABLET | Freq: Every day | ORAL | 1 refills | 20.00 days | Status: CP | PRN
Start: 2023-06-17 — End: 2024-06-16

## 2023-06-23 DIAGNOSIS — F1190 Opioid use, unspecified, uncomplicated: Principal | ICD-10-CM

## 2023-07-01 ENCOUNTER — Ambulatory Visit: Payer: PPO | Admitting: Dermatology

## 2023-07-09 ENCOUNTER — Other Ambulatory Visit: Payer: Self-pay | Admitting: Cardiovascular Disease

## 2023-07-24 ENCOUNTER — Ambulatory Visit: Admit: 2023-07-24 | Discharge: 2023-07-25 | Payer: PRIVATE HEALTH INSURANCE

## 2023-07-24 DIAGNOSIS — M5432 Sciatica, left side: Principal | ICD-10-CM

## 2023-07-24 DIAGNOSIS — J3489 Other specified disorders of nose and nasal sinuses: Principal | ICD-10-CM

## 2023-07-24 MED ORDER — PREDNISONE 20 MG TABLET
ORAL_TABLET | Freq: Every day | ORAL | 0 refills | 5.00 days | Status: CP
Start: 2023-07-24 — End: 2023-07-29

## 2023-07-24 MED ORDER — IPRATROPIUM BROMIDE 42 MCG (0.06 %) NASAL SPRAY
Freq: Three times a day (TID) | NASAL | 2 refills | 28.00 days | Status: CP
Start: 2023-07-24 — End: ?

## 2023-07-24 MED ORDER — FLUTICASONE PROPIONATE 50 MCG/ACTUATION NASAL SPRAY,SUSPENSION
Freq: Two times a day (BID) | NASAL | 11 refills | 120.00 days | Status: CP
Start: 2023-07-24 — End: 2024-07-23

## 2023-07-24 MED ORDER — LIDOCAINE 5 % TOPICAL PATCH
MEDICATED_PATCH | TRANSDERMAL | 0 refills | 30.00 days | Status: CP
Start: 2023-07-24 — End: 2023-08-23

## 2023-08-01 ENCOUNTER — Encounter: Payer: Self-pay | Admitting: Dermatology

## 2023-08-01 ENCOUNTER — Ambulatory Visit: Payer: PPO | Admitting: Dermatology

## 2023-08-01 DIAGNOSIS — C44329 Squamous cell carcinoma of skin of other parts of face: Secondary | ICD-10-CM | POA: Diagnosis not present

## 2023-08-01 DIAGNOSIS — D492 Neoplasm of unspecified behavior of bone, soft tissue, and skin: Secondary | ICD-10-CM

## 2023-08-01 DIAGNOSIS — C4492 Squamous cell carcinoma of skin, unspecified: Secondary | ICD-10-CM

## 2023-08-01 DIAGNOSIS — W908XXA Exposure to other nonionizing radiation, initial encounter: Secondary | ICD-10-CM

## 2023-08-01 DIAGNOSIS — C44629 Squamous cell carcinoma of skin of left upper limb, including shoulder: Secondary | ICD-10-CM | POA: Diagnosis not present

## 2023-08-01 DIAGNOSIS — L578 Other skin changes due to chronic exposure to nonionizing radiation: Secondary | ICD-10-CM | POA: Diagnosis not present

## 2023-08-01 DIAGNOSIS — C44222 Squamous cell carcinoma of skin of right ear and external auricular canal: Secondary | ICD-10-CM

## 2023-08-01 DIAGNOSIS — C4442 Squamous cell carcinoma of skin of scalp and neck: Secondary | ICD-10-CM

## 2023-08-01 DIAGNOSIS — L570 Actinic keratosis: Secondary | ICD-10-CM

## 2023-08-01 DIAGNOSIS — Z85828 Personal history of other malignant neoplasm of skin: Secondary | ICD-10-CM

## 2023-08-01 NOTE — Patient Instructions (Signed)
Cryotherapy Aftercare  Wash gently with soap and water everyday.   Apply Vaseline and Band-Aid daily until healed.    Wound Care Instructions  Cleanse wound gently with soap and water once a day then pat dry with clean gauze. Apply a thin coat of Petrolatum (petroleum jelly, "Vaseline") over the wound (unless you have an allergy to this). We recommend that you use a new, sterile tube of Vaseline. Do not pick or remove scabs. Do not remove the yellow or white "healing tissue" from the base of the wound.  Cover the wound with fresh, clean, nonstick gauze and secure with paper tape. You may use Band-Aids in place of gauze and tape if the wound is small enough, but would recommend trimming much of the tape off as there is often too much. Sometimes Band-Aids can irritate the skin.  You should call the office for your biopsy report after 1 week if you have not already been contacted.  If you experience any problems, such as abnormal amounts of bleeding, swelling, significant bruising, significant pain, or evidence of infection, please call the office immediately.  FOR ADULT SURGERY PATIENTS: If you need something for pain relief you may take 1 extra strength Tylenol (acetaminophen) AND 2 Ibuprofen (200mg  each) together every 4 hours as needed for pain. (do not take these if you are allergic to them or if you have a reason you should not take them.) Typically, you may only need pain medication for 1 to 3 days.      Recommend daily broad spectrum sunscreen SPF 30+ to sun-exposed areas, reapply every 2 hours as needed. Call for new or changing lesions.  Staying in the shade or wearing long sleeves, sun glasses (UVA+UVB protection) and wide brim hats (4-inch brim around the entire circumference of the hat) are also recommended for sun protection.     Due to recent changes in healthcare laws, you may see results of your pathology and/or laboratory studies on MyChart before the doctors have had a  chance to review them. We understand that in some cases there may be results that are confusing or concerning to you. Please understand that not all results are received at the same time and often the doctors may need to interpret multiple results in order to provide you with the best plan of care or course of treatment. Therefore, we ask that you please give [place] 2 business days to thoroughly review all your results before contacting the office for clarification. Should we see a critical lab result, you will be contacted sooner.   If You Need Anything After Your Visit  If you have any questions or concerns for your doctor, please call our main line at [phone] and press option 4 to reach your doctor's medical assistant. If no one answers, please leave a voicemail as directed and we will return your call as soon as possible. Messages left after 4 pm will be answered the following business day.   You may also send [place] a message via MyChart. We typically respond to MyChart messages within 1-2 business days.  For prescription refills, please ask your pharmacy to contact our office. Our fax number is [phone].  If you have an urgent issue when the clinic is closed that cannot wait until the next business day, you can page your doctor at the number below.    Please note that while we do our best to be available for urgent issues outside of office hours, we are not available 24/7.  If you have an urgent issue and are unable to reach [place], you may choose to seek medical care at your doctor's office, retail clinic, urgent care center, or emergency room.  If you have a medical emergency, please immediately call 911 or go to the emergency department.  Pager Numbers  - Dr. Gwen Pounds: [phone]  - Dr. Roseanne Reno: [phone]  - Dr. Katrinka Blazing: [phone]   In the event of inclement weather, please call our main line at [phone] for an update on the status of any delays or closures.  Dermatology  Medication Tips: Please keep the boxes that topical medications come in in order to help keep track of the instructions about where and how to use these. Pharmacies typically print the medication instructions only on the boxes and not directly on the medication tubes.   If your medication is too expensive, please contact our office at [phone] option 4 or send [place] a message through MyChart.   We are unable to tell what your co-pay for medications will be in advance as this is different depending on your insurance coverage. However, we may be able to find a substitute medication at lower cost or fill out paperwork to get insurance to cover a needed medication.   If a prior authorization is required to get your medication covered by your insurance company, please allow [place] 1-2 business days to complete this process.  Drug prices often vary depending on where the prescription is filled and some pharmacies may offer cheaper prices.  The website www.goodrx.com contains coupons for medications through different pharmacies. The prices here do not account for what the cost may be with help from insurance (it may be cheaper with your insurance), but the website can give you the price if you did not use any insurance.  - You can print the associated coupon and take it with your prescription to the pharmacy.  - You may also stop by our office during regular business hours and pick up a GoodRx coupon card.  - If you need your prescription sent electronically to a different pharmacy, notify our office through [facility] or by phone at [phone] option 4.     Si Usted Necesita Algo Despus de Su Visita  Tambin puede enviarnos un mensaje a travs de Clinical cytogeneticist. Por lo general respondemos a los mensajes de MyChart en el transcurso de 1 a 2 das hbiles.  Para renovar recetas, por favor pida a su farmacia que se ponga en contacto con nuestra oficina. Annie Sable de fax es [facility] [place] [phone].  Si  tiene un asunto urgente cuando la clnica est cerrada y que no puede esperar hasta el siguiente da hbil, puede llamar/localizar a su doctor(a) al nmero que aparece a continuacin.   Por favor, tenga en cuenta que aunque hacemos todo lo posible para estar disponibles para asuntos urgentes fuera del horario de [place], no estamos disponibles las 24 horas del da, los 7 [address]  [address] de la [place].   Si tiene un problema urgente y no puede comunicarse con nosotros, puede optar por buscar atencin mdica  en el consultorio de su doctor(a), en una clnica privada, en un centro de atencin urgente o en una sala de emergencias.  Si tiene Engineer, drilling, por favor llame inmediatamente al 911 o vaya a la sala de emergencias.  Nmeros de bper  - Dr. Gwen Pounds: [phone]  - Dra. Roseanne Reno: [phone]  - Dr. Katrinka Blazing: [phone]   En caso de inclemencias del tiempo, por favor llame a Ferne Coe  lnea principal al [phone] para una actualizacin sobre el [place] de cualquier retraso o cierre.  Consejos para la medicacin en dermatologa: Por favor, guarde las cajas en las que vienen los medicamentos de uso tpico para ayudarle a seguir las instrucciones sobre dnde y cmo usarlos. Las farmacias generalmente imprimen las instrucciones del medicamento slo en las cajas y no directamente en los tubos del [place].   Si su medicamento es muy caro, por favor, pngase en contacto con Rolm Gala llamando al [phone] y presione la opcin 4 o envenos un mensaje a travs de Clinical cytogeneticist.   No podemos decirle cul ser su copago por los medicamentos por adelantado ya que esto es diferente dependiendo de la cobertura de su seguro. Sin embargo, es posible que podamos encontrar un medicamento sustituto a Audiological scientist un formulario para que el seguro cubra el medicamento que se considera necesario.   Si se requiere una autorizacin previa para que su compaa de seguros [place] su medicamento, por  favor permtanos de 1 a 2 das hbiles para completar [address]  Los precios de los medicamentos varan con frecuencia dependiendo del Environmental consultant de dnde se surte la receta y alguna farmacias pueden ofrecer precios ms baratos.  El sitio web www.goodrx.com tiene cupones para medicamentos de Health and safety inspector. Los precios aqu no tienen en cuenta lo que podra costar con la ayuda del seguro (puede ser ms barato con su seguro), pero el sitio web puede darle el precio si no utiliz Tourist information centre manager.  - Puede imprimir el cupn correspondiente y llevarlo con su receta a la farmacia.  - Tambin puede pasar por nuestra oficina durante el horario de atencin regular y Education officer, museum una tarjeta de cupones de GoodRx.  - Si necesita que su receta se enve electrnicamente a una farmacia diferente, informe a nuestra oficina a travs de MyChart de [place] o por telfono llamando al [phone] y presione la opcin 4.

## 2023-08-01 NOTE — Progress Notes (Addendum)
 Follow-Up Visit   Subjective  Joshua KOPS Sr. is a 68 y.o. male who presents for the following: discuss recent pathology results for left hand and right forearm. SCC and SCCis still require treatment.  Recheck skin cancer sites on left forearm x2.   The patient has spots, moles and lesions to be evaluated, some may be new or changing and the patient may have concern these could be cancer.  This patient is accompanied in the office by his  wife .   The following portions of the chart were reviewed this encounter and updated as appropriate: medications, allergies, medical history  Review of Systems:  No other skin or systemic complaints except as noted in HPI or Assessment and Plan.  Objective  Well appearing patient in no apparent distress; mood and affect are within normal limits.  A focused examination was performed of the following areas: Face, arms, hands  Relevant exam findings are noted in the Assessment and Plan.  Right Parietal Scalp 11 mm keratotic plaque   Posterior Crus of  Right Antihelix 9 mm keratotic papule with hemorrhagic crust   Left Parietal Scalp 5 mm keratotic pink papule   Left Temple 5 mm endophytic papule  Scalp 16, face 6 (22) Erythematous thin papules/macules with gritty scale.   Assessment & Plan    HISTORY OF SQUAMOUS CELL CARCINOMA OF THE SKIN - 12/12/2022: L elbow  WD SCC fully healed - 04/17/2023: L distal forearm SCCis fully healed.  - 05/21/2023: R extensor mid forearm SCCis fully healed - No evidence of recurrence today - No lymphadenopathy - Recommend regular full body skin exams - Recommend daily broad spectrum sunscreen SPF 30+ to sun-exposed areas, reapply every 2 hours as needed.  - Call if any new or changing lesions are noted between office visits    Well Differentiated SCC  Site: Left dorsal hand  Treatment: Referral sent for Mohs to [facility]  ACTINIC DAMAGE - chronic, secondary to cumulative UV radiation exposure/sun  exposure over time - diffuse scaly erythematous macules with underlying dyspigmentation - Recommend daily broad spectrum sunscreen SPF 30+ to sun-exposed areas, reapply every 2 hours as needed.  - Recommend staying in the shade or wearing long sleeves, sun glasses (UVA+UVB protection) and wide brim hats (4-inch brim around the entire circumference of the hat). - Call for new or changing lesions.  NEOPLASM OF SKIN (4) Right Parietal Scalp Epidermal / dermal shaving  Lesion diameter (cm):  1.1 Informed consent: discussed and consent obtained   Timeout: patient name, date of birth, surgical site, and procedure verified   Procedure prep:  Patient was prepped and draped in usual sterile fashion Prep type:  Povidone-iodine and isopropyl alcohol Anesthesia: the lesion was anesthetized in a standard fashion   Anesthetic:  1% lidocaine w/ epinephrine 1-100,000 buffered w/ 8.4% NaHCO3 Instrument used: DermaBlade   Hemostasis achieved with: pressure and aluminum chloride   Outcome: patient tolerated procedure well   Post-procedure details: wound care instructions given    Destruction of lesion Complexity: simple   Destruction method: electrodesiccation and curettage   Informed consent: discussed and consent obtained   Timeout:  patient name, date of birth, surgical site, and procedure verified Procedure prep:  Patient was prepped and draped in usual sterile fashion Prep type:  Isopropyl alcohol Anesthesia: the lesion was anesthetized in a standard fashion   Anesthetic:  1% lidocaine w/ epinephrine 1-100,000 local infiltration Curettage performed in three different directions: Yes   Electrodesiccation performed over the curetted  area: Yes   Curettage cycles:  3 Lesion length (cm):  1.1 Margin per side (cm):  0.2 Final wound size (cm):  1.5 Hemostasis achieved with:  aluminum chloride and electrodesiccation Outcome: patient tolerated procedure well with no complications   Post-procedure  details: sterile dressing applied and wound care instructions given   Dressing type: bandage and petrolatum   Specimen 1 - Surgical pathology Differential Diagnosis: SCC   Check Margins: No Posterior Crus of  Right Antihelix Skin / nail biopsy Type of biopsy: tangential   Informed consent: discussed and consent obtained   Timeout: patient name, date of birth, surgical site, and procedure verified   Procedure prep:  Patient was prepped and draped in usual sterile fashion Prep type:  Isopropyl alcohol Anesthesia: the lesion was anesthetized in a standard fashion   Anesthetic:  1% lidocaine w/ epinephrine 1-100,000 buffered w/ 8.4% NaHCO3 Instrument used: DermaBlade   Hemostasis achieved with: pressure and aluminum chloride   Outcome: patient tolerated procedure well   Post-procedure details: sterile dressing applied and wound care instructions given   Dressing type: bandage and petrolatum   Specimen 2 - Surgical pathology Differential Diagnosis: SCC  Check Margins: No Left Parietal Scalp Epidermal / dermal shaving  Lesion diameter (cm):  0.5 Informed consent: discussed and consent obtained   Timeout: patient name, date of birth, surgical site, and procedure verified   Procedure prep:  Patient was prepped and draped in usual sterile fashion Prep type:  Povidone-iodine and isopropyl alcohol Anesthesia: the lesion was anesthetized in a standard fashion   Anesthetic:  1% lidocaine w/ epinephrine 1-100,000 buffered w/ 8.4% NaHCO3 Instrument used: DermaBlade   Hemostasis achieved with: pressure and aluminum chloride   Outcome: patient tolerated procedure well   Post-procedure details: wound care instructions given    Destruction of lesion Complexity: simple   Destruction method: electrodesiccation and curettage   Informed consent: discussed and consent obtained   Timeout:  patient name, date of birth, surgical site, and procedure verified Procedure prep:  Patient was prepped and  draped in usual sterile fashion Prep type:  Isopropyl alcohol Anesthesia: the lesion was anesthetized in a standard fashion   Anesthetic:  1% lidocaine w/ epinephrine 1-100,000 local infiltration Curettage performed in three different directions: Yes   Electrodesiccation performed over the curetted area: Yes   Curettage cycles:  3 Lesion length (cm):  0.5 Margin per side (cm):  0.2 Final wound size (cm):  0.9 Hemostasis achieved with:  aluminum chloride and electrodesiccation Outcome: patient tolerated procedure well with no complications   Post-procedure details: sterile dressing applied and wound care instructions given   Dressing type: bandage and petrolatum   Specimen 3 - Surgical pathology Differential Diagnosis: SCC  Check Margins: No Left Temple Skin / nail biopsy Type of biopsy: tangential   Informed consent: discussed and consent obtained   Timeout: patient name, date of birth, surgical site, and procedure verified   Procedure prep:  Patient was prepped and draped in usual sterile fashion Prep type:  Isopropyl alcohol Anesthesia: the lesion was anesthetized in a standard fashion   Anesthetic:  1% lidocaine w/ epinephrine 1-100,000 buffered w/ 8.4% NaHCO3 Instrument used: DermaBlade   Hemostasis achieved with: pressure and aluminum chloride   Outcome: patient tolerated procedure well   Post-procedure details: sterile dressing applied and wound care instructions given   Dressing type: bandage and petrolatum   Specimen 4 - Surgical pathology Differential Diagnosis: SCC vs BCC  Check Margins: No AK (ACTINIC KERATOSIS) (22) Scalp  16, face 6 (22) Actinic keratoses are precancerous spots that appear secondary to cumulative UV radiation exposure/sun exposure over time. They are chronic with expected duration over 1 year. A portion of actinic keratoses will progress to squamous cell carcinoma of the skin. It is not possible to reliably predict which spots will progress to skin  cancer and so treatment is recommended to prevent development of skin cancer.  Recommend daily broad spectrum sunscreen SPF 30+ to sun-exposed areas, reapply every 2 hours as needed.  Recommend staying in the shade or wearing long sleeves, sun glasses (UVA+UVB protection) and wide brim hats (4-inch brim around the entire circumference of the hat). Call for new or changing lesions. Destruction of lesion - Scalp 16, face 6 (22) Complexity: simple   Destruction method: cryotherapy   Informed consent: discussed and consent obtained   Timeout:  patient name, date of birth, surgical site, and procedure verified Lesion destroyed using liquid nitrogen: Yes   Region frozen until ice ball extended beyond lesion: Yes   Cryo cycles: 1 or 2. Outcome: patient tolerated procedure well with no complications   Post-procedure details: wound care instructions given   Additional details:  Prior to procedure, discussed risks of blister formation, small wound, skin dyspigmentation, or rare scar following cryotherapy. Recommend Vaseline ointment to treated areas while healing.  SCCA (SQUAMOUS CELL CARCINOMA) OF SKIN   Related Procedures Ambulatory referral to Dermatology ACTINIC ELASTOSIS   Related Medications fluorouracil (EFUDEX) 5 % cream Apply topically 2 (two) times daily. Apply the cream twice per day to precancers until redness and irritation develop (usually occurs by day 7), then stop and allow it to heal. Protect the area from sunlight while it is healing with a hat or SPF30+ sunscreen. SCC (SQUAMOUS CELL CARCINOMA)    Return in about 3 months (around 11/01/2023) for Biopsy Follow Up, Recheck SCCs.  I, Lawson Radar, CMA, am acting as scribe for Elie Goody, MD.   Documentation: I have reviewed the above documentation for accuracy and completeness, and I agree with the above.  Elie Goody, MD

## 2023-08-05 ENCOUNTER — Other Ambulatory Visit: Payer: Self-pay | Admitting: Cardiology

## 2023-08-05 LAB — SURGICAL PATHOLOGY

## 2023-08-06 ENCOUNTER — Telehealth: Payer: Self-pay

## 2023-08-06 DIAGNOSIS — C4492 Squamous cell carcinoma of skin, unspecified: Secondary | ICD-10-CM

## 2023-08-06 NOTE — Telephone Encounter (Signed)
 Called patient. N/A. Left detailed message on voicemail with pathology results. Advised will be sending referral to [facility] for Mohs on right ear and left temple.  Referral sent.

## 2023-08-06 NOTE — Telephone Encounter (Signed)
-----   Message from [facility] sent at 08/05/2023  9:45 PM EDT ----- Diagnosis: 1. Skin, right parietal scalp :       HYPERTROPHIC ACTINIC KERATOSIS        2. Skin, posterior crus of right antihelix :       MODERATELY DIFFERENTIATED SQUAMOUS CELL CARCINOMA, ADENOID VARIANT, ULCERATED        3. Skin, left parietal scalp :       WELL DIFFERENTIATED SQUAMOUS CELL CARCINOMA, ACANTHOLYTIC (ADENOID) VARIANT        4. Skin, left temple :       WELL DIFFERENTIATED SQUAMOUS CELL CARCINOMA    FOR SITE 2 and 4 Please call with diagnosis and determine where the patient would like to have Mohs surgery.  Explanation: This is a squamous cell skin cancer that has grown beyond the surface of the skin and is invading the second layer of the skin. It has the potential to spread beyond the skin and threaten your health, so I recommend treating it.  Treatment: Given the location and type of skin cancer, I recommend Mohs surgery. Mohs surgery involves cutting out the skin cancer and then checking under the microscope to ensure the whole skin cancer was removed. If any skin cancer remains, the surgeon will cut out more until it is fully removed. The cure rate is about 98-99%. Once the Mohs surgeon confirms the skin cancer is out, they will discuss the options to repair or heal the area. You must take it easy for about two weeks after surgery (no lifting over 10-15 lbs, avoid activity to get your heart rate and blood pressure up). It is done at another office outside of [place] ([place], [place], or [place]).  FOR SITE 1  Biopsy shows a thickened precancer. It was treated during your appointment.  FOR SITE 3 Biopsy shows a squamous cell skin cancer that has grown beyond the surface of the skin and is invading the second layer of the skin. It was treated during your appointment. We will recheck it at your next appointment

## 2023-09-09 ENCOUNTER — Other Ambulatory Visit: Payer: Self-pay | Admitting: Cardiology

## 2023-09-09 ENCOUNTER — Other Ambulatory Visit: Payer: Self-pay | Admitting: Cardiovascular Disease

## 2023-10-06 ENCOUNTER — Other Ambulatory Visit: Payer: Self-pay | Admitting: Cardiovascular Disease

## 2023-10-16 DIAGNOSIS — R110 Nausea: Principal | ICD-10-CM

## 2023-10-16 MED ORDER — ONDANSETRON HCL 8 MG TABLET
ORAL_TABLET | Freq: Two times a day (BID) | ORAL | 5 refills | 30.00000 days | Status: CP
Start: 2023-10-16 — End: ?

## 2023-10-31 ENCOUNTER — Ambulatory Visit: Admitting: Dermatology

## 2023-11-06 ENCOUNTER — Other Ambulatory Visit: Payer: Self-pay | Admitting: Cardiovascular Disease

## 2023-11-21 ENCOUNTER — Encounter: Payer: Self-pay | Admitting: Dermatology

## 2023-11-21 ENCOUNTER — Ambulatory Visit: Admitting: Dermatology

## 2023-11-21 DIAGNOSIS — C4442 Squamous cell carcinoma of skin of scalp and neck: Secondary | ICD-10-CM

## 2023-11-21 DIAGNOSIS — D0461 Carcinoma in situ of skin of right upper limb, including shoulder: Secondary | ICD-10-CM

## 2023-11-21 DIAGNOSIS — D492 Neoplasm of unspecified behavior of bone, soft tissue, and skin: Secondary | ICD-10-CM | POA: Diagnosis not present

## 2023-11-21 DIAGNOSIS — L905 Scar conditions and fibrosis of skin: Secondary | ICD-10-CM

## 2023-11-21 DIAGNOSIS — D2239 Melanocytic nevi of other parts of face: Secondary | ICD-10-CM | POA: Diagnosis not present

## 2023-11-21 DIAGNOSIS — Z1283 Encounter for screening for malignant neoplasm of skin: Secondary | ICD-10-CM

## 2023-11-21 DIAGNOSIS — D099 Carcinoma in situ, unspecified: Secondary | ICD-10-CM

## 2023-11-21 DIAGNOSIS — L570 Actinic keratosis: Secondary | ICD-10-CM

## 2023-11-21 DIAGNOSIS — C44329 Squamous cell carcinoma of skin of other parts of face: Secondary | ICD-10-CM

## 2023-11-21 DIAGNOSIS — D0439 Carcinoma in situ of skin of other parts of face: Secondary | ICD-10-CM | POA: Diagnosis not present

## 2023-11-21 DIAGNOSIS — C44629 Squamous cell carcinoma of skin of left upper limb, including shoulder: Secondary | ICD-10-CM

## 2023-11-21 DIAGNOSIS — C4492 Squamous cell carcinoma of skin, unspecified: Secondary | ICD-10-CM

## 2023-11-21 DIAGNOSIS — D1801 Hemangioma of skin and subcutaneous tissue: Secondary | ICD-10-CM

## 2023-11-21 DIAGNOSIS — L578 Other skin changes due to chronic exposure to nonionizing radiation: Secondary | ICD-10-CM | POA: Diagnosis not present

## 2023-11-21 DIAGNOSIS — W908XXA Exposure to other nonionizing radiation, initial encounter: Secondary | ICD-10-CM

## 2023-11-21 DIAGNOSIS — L821 Other seborrheic keratosis: Secondary | ICD-10-CM

## 2023-11-21 DIAGNOSIS — L814 Other melanin hyperpigmentation: Secondary | ICD-10-CM

## 2023-11-21 DIAGNOSIS — D229 Melanocytic nevi, unspecified: Secondary | ICD-10-CM

## 2023-11-21 NOTE — Progress Notes (Signed)
 Follow-Up Visit   Subjective  Joshua SWEET Sr. is a 68 y.o. male who presents for the following: recheck bx sites AK R parietal scalp, SCC Posterior Crus of R antihelix, SCC pt has not scheduled for mohs, L parietal scalp, txted with [facility], SCC L temple pt has not scheduled for mohs, hx of multiple SCCs, hx of Aks, pt noticed area post scalp irritating  Upper body skin check  The following portions of the chart were reviewed this encounter and updated as appropriate: medications, allergies, medical history  Review of Systems:  No other skin or systemic complaints except as noted in HPI or Assessment and Plan.  Objective  Well appearing patient in no apparent distress; mood and affect are within normal limits.   A focused examination was performed of the following areas: Face, scalp, ears, arms, chest abdomen back  Relevant exam findings are noted in the Assessment and Plan.  R frontal temple scalp 1.0cm keratotic atrophic plaque  R sup sideburn 8.63mm keratotic indurated pap  R lat cheek 8.13mm keratotic pink pap  L proximal forearm 1.0cm keratotic plaque with hemorrhagic crusting  L temple scalp 7.53mm eroded pink pap   right extensor mid forearm x 1 2 mm pink scaly papule at 12 o clock on edge of SCCis biopsy scar   Assessment & Plan   SCC  Bx proven Posterior crus of antihelix Left temple Exam: pink bx sites  Treatment Plan: Referral for mohs has been sent to [facility], pt has not scheduled appointment at this time. Discussed sending mohs referral elsewhere, pt prefers [facility]. Will cancel referral to [facility] and send to Dr. Bluford at [facility]  LENTIGINES, SEBORRHEIC KERATOSES, HEMANGIOMAS - Benign normal skin lesions - Benign-appearing - Call for any changes  MELANOCYTIC NEVI - Tan-brown and/or pink-flesh-colored symmetric macules and papules - Benign appearing on exam today - Observation - Call clinic for new or changing moles -  Recommend daily use of broad spectrum spf 30+ sunscreen to sun-exposed areas.   ACTINIC DAMAGE - Chronic condition, secondary to cumulative UV/sun exposure - diffuse scaly erythematous macules with underlying dyspigmentation - Recommend daily broad spectrum sunscreen SPF 30+ to sun-exposed areas, reapply every 2 hours as needed.  - Staying in the shade or wearing long sleeves, sun glasses (UVA+UVB protection) and wide brim hats (4-inch brim around the entire circumference of the hat) are also recommended for sun protection.  - Call for new or changing lesions.  SKIN CANCER SCREENING PERFORMED TODAY   HISTORY OF SQUAMOUS CELL CARCINOMA OF THE SKIN - No evidence of recurrence today - No lymphadenopathy - Recommend regular full body skin exams - Recommend daily broad spectrum sunscreen SPF 30+ to sun-exposed areas, reapply every 2 hours as needed.  - Call if any new or changing lesions are noted between office visits - L parietal scalp clear, R infer helix clear with bx only- observe, L dorsal hand clear with bx only- observe   NEOPLASM OF SKIN (5) R frontal temple scalp Skin / nail biopsy Type of biopsy: tangential   Informed consent: discussed and consent obtained   Timeout: patient name, date of birth, surgical site, and procedure verified   Procedure prep:  Patient was prepped and draped in usual sterile fashion Prep type:  Isopropyl alcohol Anesthesia: the lesion was anesthetized in a standard fashion   Anesthetic:  1% lidocaine  w/ epinephrine 1-100,000 buffered w/ 8.4% NaHCO3 Instrument used: DermaBlade   Hemostasis achieved with: pressure and aluminum  chloride   Outcome: patient tolerated procedure well   Post-procedure details: sterile dressing applied and wound care instructions given   Dressing type: bandage and bacitracin    Specimen 1 - Surgical pathology Differential Diagnosis: R/O SCC  Check Margins: No 1.0cm keratotic atrophic plaque R sup sideburn Skin / nail  biopsy Type of biopsy: tangential   Informed consent: discussed and consent obtained   Timeout: patient name, date of birth, surgical site, and procedure verified   Procedure prep:  Patient was prepped and draped in usual sterile fashion Prep type:  Isopropyl alcohol Anesthesia: the lesion was anesthetized in a standard fashion   Anesthetic:  1% lidocaine  w/ epinephrine 1-100,000 buffered w/ 8.4% NaHCO3 Instrument used: DermaBlade   Hemostasis achieved with: pressure and aluminum chloride   Outcome: patient tolerated procedure well   Post-procedure details: sterile dressing applied and wound care instructions given   Dressing type: bandage and bacitracin    Specimen 2 - Surgical pathology Differential Diagnosis:  R/O SCC  Check Margins: No 8.16mm keratotic indurated pap R lat cheek Skin / nail biopsy Type of biopsy: tangential   Informed consent: discussed and consent obtained   Timeout: patient name, date of birth, surgical site, and procedure verified   Procedure prep:  Patient was prepped and draped in usual sterile fashion Prep type:  Isopropyl alcohol Anesthesia: the lesion was anesthetized in a standard fashion   Anesthetic:  1% lidocaine  w/ epinephrine 1-100,000 buffered w/ 8.4% NaHCO3 Instrument used: DermaBlade   Hemostasis achieved with: pressure and aluminum chloride   Outcome: patient tolerated procedure well   Post-procedure details: sterile dressing applied and wound care instructions given   Dressing type: bandage and bacitracin    Specimen 3 - Surgical pathology Differential Diagnosis:  R/O SCC  Check Margins: No 8.39mm keratotic pink pap L proximal forearm Skin / nail biopsy Type of biopsy: tangential   Informed consent: discussed and consent obtained   Timeout: patient name, date of birth, surgical site, and procedure verified   Procedure prep:  Patient was prepped and draped in usual sterile fashion Prep type:  Isopropyl alcohol Anesthesia: the lesion  was anesthetized in a standard fashion   Anesthetic:  1% lidocaine  w/ epinephrine 1-100,000 buffered w/ 8.4% NaHCO3 Instrument used: DermaBlade   Hemostasis achieved with: pressure and aluminum chloride   Outcome: patient tolerated procedure well   Post-procedure details: sterile dressing applied and wound care instructions given   Dressing type: bandage and bacitracin    Specimen 4 - Surgical pathology Differential Diagnosis:  R/O SCC  Check Margins: No 1.0cm keratotic plaque with hemorrhagic crusting  L temple scalp Skin / nail biopsy Type of biopsy: tangential   Informed consent: discussed and consent obtained   Timeout: patient name, date of birth, surgical site, and procedure verified   Procedure prep:  Patient was prepped and draped in usual sterile fashion Prep type:  Isopropyl alcohol Anesthesia: the lesion was anesthetized in a standard fashion   Anesthetic:  1% lidocaine  w/ epinephrine 1-100,000 buffered w/ 8.4% NaHCO3 Instrument used: DermaBlade   Hemostasis achieved with: pressure and aluminum chloride   Outcome: patient tolerated procedure well   Post-procedure details: sterile dressing applied and wound care instructions given   Dressing type: bandage and bacitracin    Specimen 5 - Surgical pathology Differential Diagnosis:  R/O SCC  Check Margins: No 7.51mm eroded pink pap SCC (SQUAMOUS CELL CARCINOMA)   Related Procedures Ambulatory referral to Dermatology SQUAMOUS CELL CARCINOMA IN SITU (SCCIS) right extensor  mid forearm x 1 Destruction of lesion Complexity: simple   Destruction method: cryotherapy   Informed consent: discussed and consent obtained   Timeout:  patient name, date of birth, surgical site, and procedure verified Lesion destroyed using liquid nitrogen: Yes   Region frozen until ice ball extended beyond lesion: Yes   Cryo cycles: 1 or 2. Outcome: patient tolerated procedure well with no complications   Post-procedure details: wound care  instructions given    Bx proven SCC IS 05/21/2023 MULTIPLE BENIGN NEVI   LENTIGINES   ACTINIC ELASTOSIS   Related Medications fluorouracil  (EFUDEX ) 5 % cream Apply topically 2 (two) times daily. Apply the cream twice per day to precancers until redness and irritation develop (usually occurs by day 7), then stop and allow it to heal. Protect the area from sunlight while it is healing with a hat or SPF30+ sunscreen. SEBORRHEIC KERATOSES   CHERRY ANGIOMA    Return in about 2 months (around 01/22/2024) for f/u SCCs, bx sites, schedule in surgery appt on a wednesday.  I, Grayce Saunas, RMA, am acting as scribe for Boneta Sharps, MD .   Documentation: I have reviewed the above documentation for accuracy and completeness, and I agree with the above.  Boneta Sharps, MD

## 2023-11-21 NOTE — Patient Instructions (Addendum)

## 2023-11-22 LAB — SURGICAL PATHOLOGY

## 2023-11-25 ENCOUNTER — Ambulatory Visit: Payer: Self-pay | Admitting: Dermatology

## 2023-11-27 NOTE — Telephone Encounter (Signed)
 LMOVM for patient to C/B regarding pathology results and treatment options/plans.

## 2023-11-27 NOTE — Telephone Encounter (Signed)
-----   Message from [facility] sent at 11/26/2023  5:47 PM EDT ----- Diagnosis: 1. Skin, R frontal temple scalp :      SQUAMOUS CELL CARCINOMA, KERATOACANTHOMA TYPE, DEEP MARGIN INVOLVED       2. Skin, R sup sideburn :      SQUAMOUS CELL CARCINOMA IN SITU, HYPERTROPHIC, BASE INVOLVED       3. Skin, R lat cheek :      LICHENOID ACTINIC KERATOSIS OVERLYING MELANOCYTIC NEVUS WITH SCAR, SEE      DESCRIPTION       4. Skin, L proximal forearm :      WELL DIFFERENTIATED SQUAMOUS CELL CARCINOMA       5. Skin, L temple scalp :      WELL DIFFERENTIATED SQUAMOUS CELL CARCINOMA   Please call R temporal scalp: Invasive SCC Treatment Mohs Dr Bluford  2. R sideburn: squamous skin cancer in top layer of skin Treatment: EDC vs 5FU/calcipotriene   3. R cheek: inflamed precancer and benign mole. Precancer will likely resolve after biopsy.  No treatment needed.  4. L forearm: invasive SCC Treatment: EDC  5. L temporal scalp: invasive SCC Treatment Mohs Dr Bluford ----- Message ----- From: Interface, [facility] Sent: 11/22/2023   6:05 PM EDT To: Boneta Sharps, MD

## 2023-12-06 ENCOUNTER — Other Ambulatory Visit: Payer: Self-pay | Admitting: Cardiovascular Disease

## 2023-12-14 MED ORDER — PREGABALIN 50 MG CAPSULE
ORAL_CAPSULE | Freq: Two times a day (BID) | ORAL | 5 refills | 30.00000 days
Start: 2023-12-14 — End: 2024-06-11

## 2023-12-16 ENCOUNTER — Encounter: Admit: 2023-12-16 | Discharge: 2023-12-16 | Payer: Medicare (Managed Care)

## 2023-12-16 DIAGNOSIS — F1190 Opioid use, unspecified, uncomplicated: Principal | ICD-10-CM

## 2023-12-16 DIAGNOSIS — J3489 Other specified disorders of nose and nasal sinuses: Principal | ICD-10-CM

## 2023-12-16 DIAGNOSIS — G8929 Other chronic pain: Principal | ICD-10-CM

## 2023-12-16 DIAGNOSIS — Z794 Long term (current) use of insulin: Principal | ICD-10-CM

## 2023-12-16 DIAGNOSIS — Z1211 Encounter for screening for malignant neoplasm of colon: Principal | ICD-10-CM

## 2023-12-16 DIAGNOSIS — F17210 Nicotine dependence, cigarettes, uncomplicated: Principal | ICD-10-CM

## 2023-12-16 DIAGNOSIS — Z122 Encounter for screening for malignant neoplasm of respiratory organs: Principal | ICD-10-CM

## 2023-12-16 DIAGNOSIS — M25569 Pain in unspecified knee: Principal | ICD-10-CM

## 2023-12-16 DIAGNOSIS — G4700 Insomnia, unspecified: Principal | ICD-10-CM

## 2023-12-16 DIAGNOSIS — Z79899 Other long term (current) drug therapy: Principal | ICD-10-CM

## 2023-12-16 DIAGNOSIS — E1142 Type 2 diabetes mellitus with diabetic polyneuropathy: Principal | ICD-10-CM

## 2023-12-16 DIAGNOSIS — E113293 Type 2 diabetes mellitus with mild nonproliferative diabetic retinopathy without macular edema, bilateral: Principal | ICD-10-CM

## 2023-12-16 DIAGNOSIS — L02411 Cutaneous abscess of right axilla: Principal | ICD-10-CM

## 2023-12-16 MED ORDER — ZOLPIDEM 10 MG TABLET
ORAL_TABLET | ORAL | 0 refills | 0.00000 days | Status: CP
Start: 2023-12-16 — End: ?

## 2023-12-16 MED ORDER — IBUPROFEN 800 MG TABLET
ORAL_TABLET | Freq: Three times a day (TID) | ORAL | 0 refills | 20.00000 days | Status: CP | PRN
Start: 2023-12-16 — End: 2024-12-15

## 2023-12-16 MED ORDER — IPRATROPIUM BROMIDE 42 MCG (0.06 %) NASAL SPRAY
Freq: Three times a day (TID) | NASAL | 11 refills | 28.00000 days | Status: CP
Start: 2023-12-16 — End: 2024-12-15

## 2023-12-16 MED ORDER — DOXYCYCLINE HYCLATE 100 MG TABLET
ORAL_TABLET | Freq: Two times a day (BID) | ORAL | 0 refills | 7.00000 days | Status: CP
Start: 2023-12-16 — End: 2023-12-23

## 2023-12-16 MED ORDER — BUPRENORPHINE HCL 8 MG SUBLINGUAL TABLET
ORAL_TABLET | SUBLINGUAL | 2 refills | 0.00000 days | Status: CP
Start: 2023-12-16 — End: 2024-03-11

## 2023-12-20 ENCOUNTER — Other Ambulatory Visit: Payer: Self-pay | Admitting: *Deleted

## 2023-12-20 DIAGNOSIS — F17210 Nicotine dependence, cigarettes, uncomplicated: Secondary | ICD-10-CM

## 2023-12-20 DIAGNOSIS — Z122 Encounter for screening for malignant neoplasm of respiratory organs: Secondary | ICD-10-CM

## 2023-12-23 ENCOUNTER — Ambulatory Visit (INDEPENDENT_AMBULATORY_CARE_PROVIDER_SITE_OTHER): Admitting: Cardiovascular Disease

## 2023-12-23 ENCOUNTER — Encounter: Payer: Self-pay | Admitting: Cardiovascular Disease

## 2023-12-23 VITALS — BP 110/66 | HR 85 | Ht 69.0 in | Wt 213.2 lb

## 2023-12-23 DIAGNOSIS — I2510 Atherosclerotic heart disease of native coronary artery without angina pectoris: Secondary | ICD-10-CM

## 2023-12-23 DIAGNOSIS — I10 Essential (primary) hypertension: Secondary | ICD-10-CM

## 2023-12-23 DIAGNOSIS — G4733 Obstructive sleep apnea (adult) (pediatric): Secondary | ICD-10-CM

## 2023-12-23 DIAGNOSIS — E782 Mixed hyperlipidemia: Secondary | ICD-10-CM | POA: Diagnosis not present

## 2023-12-23 DIAGNOSIS — E662 Morbid (severe) obesity with alveolar hypoventilation: Secondary | ICD-10-CM

## 2023-12-23 MED ORDER — LISINOPRIL-HYDROCHLOROTHIAZIDE 20-12.5 MG PO TABS: 1.0000 | ORAL_TABLET | Freq: Every day | ORAL | 11 refills | Status: AC

## 2023-12-23 MED ORDER — NEXLIZET 180-10 MG PO TABS: 1.0000 | ORAL_TABLET | Freq: Every day | ORAL | 2 refills | Status: DC

## 2023-12-23 MED ORDER — REPATHA SURECLICK 140 MG/ML ~~LOC~~ SOAJ: 140.0000 mg | SUBCUTANEOUS | 0 refills | Status: DC

## 2023-12-23 NOTE — Progress Notes (Signed)
 Cardiology Office Note   Date:  12/23/2023   ID:  Joshua JONELLE Loge Sr., DOB [birth year], MRN [id number]  PCP:  Casimir Reagin, MD  Cardiologist:  Denyse Bathe, MD      History of Present Illness: Joshua SAVIDGE Sr. is a 68 y.o. male who presents for  Chief Complaint  Patient presents with   Follow-up    Follow up    Feeling good.      Past Medical History:  Diagnosis Date   A-fib (HCC) 1995   Actinic keratosis 06/12/2013   Anterior chest.   Adenomatous polyps    Anxiety    a.) on BZO (diazepam) PRN   Arthritis    CAD (coronary artery disease) 01/15/2011   a.) LHC 01/15/2011: EF 60%; 40% mLAD, 40% OM1 - med mgmt; b.) cCTA 09/2012: Calcium score 400; c.) cCTA 08/16/2014: Calcium score 1003.3; d.) LHC 03/24/2021 [facility]): EF 60%; 80% pRCA, 100 mLAD --> Recommended consult at [facility] for high risk PCI; e.) PCI 05/03/2021 [facility]): CTO mLAD stented with 2.5 x 32 mm Synergy DES (mLAD) and 2.5 x 28 mm Synergy DES (m-dLAD)   Carpal tunnel syndrome    Chronic back pain    Chronic knee pain    Diastolic dysfunction 03/24/2019   a.)  TTE 03/24/2019: EF 50-55%, mild-mod LVH, mod-sev LAE, mild MR/TR/PR, G1DD   Diverticulosis    DOE (dyspnea on exertion)    Erectile dysfunction    a.) on PDE5i (sildenafil )   GERD (gastroesophageal reflux disease)    History of kidney stones    History of marijuana use    Hyperlipidemia    Hyperprolactinemia (HCC)    Hypertension    Hypertrophic actinic keratosis 04/17/2023   Right temporal scalp. Hypertrophic AK and SK. EDC   Hypertrophic actinic keratosis 08/01/2023   Right parietal scalp. Tx EDC   Hypogonadism in male    a.) on exogenous TRT (depotestosterone cypionate) injections   Insomnia    a.) on hypnotic (zolpidem)   Opioid use disorder    a.) on buprenorphine; b.) has naloxone prescribed   OSA on CPAP    Palpitations    Proliferative diabetic retinopathy of both eyes (HCC)    PVD (peripheral vascular disease) (HCC)    RBBB  (right bundle branch block)    SCC (squamous cell carcinoma) 05/21/2023   left dorsal hand, needs Mohs referral, 11/21/23 clear today, observe   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 SCCis, hypertrophic   Squamous cell carcinoma in situ 08/01/2022   Right elbow. Hypertrophic. EDC and 5FU 04/17/23   Squamous cell carcinoma in situ 04/17/2023   Left distal forearm. EDC   Squamous cell carcinoma in situ 04/17/2023   Left mid forearm. EDC   Squamous cell carcinoma in situ 11/21/2023   Right superior sideburn. Hypertrophic. Tx pending   Squamous cell carcinoma in situ (SCCIS) 05/21/2023   right extensor mid forearm, needs EDC, clear with bx 08/01/23, LN2 11/21/23   Squamous cell carcinoma of skin 07/03/2018   Right posterior aspect midline scalp. WD SCC, acantholytic (adenoid) variant.   Squamous cell carcinoma of skin 07/03/2018   Left posterior midline scalp. WD SCC, acantholytic (adenoid) variant.   Squamous cell carcinoma of skin 12/12/2022   Left elbow. WD SCC with superficial infiltration, ulcerated. Needs Excision.   Squamous cell carcinoma of skin 04/17/2023   Right lateral forehead. KA-type. EDC   Squamous cell carcinoma of skin 04/17/2023   Right inferior helix. WD  SCC, needs mohs, 11/21/23 clear today, observe   Squamous cell carcinoma of skin 04/17/2023   Right postauricular scalp. WD SCC. EDC   Squamous cell carcinoma of skin 04/17/2023   Posterior vertex. WD SCC, acantholytic variant. EDC   Squamous cell carcinoma of skin 04/17/2023   Right medial mid forearm. WD SCC. EDC.   Squamous cell carcinoma of skin 08/01/2023   Posterior crus of antihelix. Mod. dif. SCC adenoid variant, ulcerated. Mohs referral sent to Dr. Bluford 11/21/23   Squamous cell carcinoma of skin 08/01/2023   Left parietal scalp. Well dif. SCC adenoid variant. Tx EDC.   Squamous cell carcinoma of skin 08/01/2023   Left temple. Well dif. SCC. Mohs referral sent to Dr. Bluford 11/21/23    Squamous cell carcinoma of skin 11/21/2023   Right frontal temple scalp. KA-type. Needs Mohs referral   Squamous cell carcinoma of skin 11/21/2023   Left proximal forearm. Needs EDC   Squamous cell carcinoma of skin 11/21/2023   Left temporal scalp. WD. Needs Mohs referral.   Type 2 diabetes mellitus treated with insulin  (HCC)    a.) uses Freestyle Libre CGM     Past Surgical History:  Procedure Laterality Date   APPENDECTOMY     AQUEOUS SHUNT Left 07/16/2016   Procedure: AQUEOUS SHUNT  left  diabetic;  Surgeon: Donzell Arlyce Budd, MD;  Location: [facility];  Service: Ophthalmology;  Laterality: Left;  ahmed tube shunt diabetic - insulin  sleep apnea   CARPAL TUNNEL RELEASE Left 01/11/2022   Procedure: CARPAL TUNNEL RELEASE;  Surgeon: Cleotilde Barrio, MD;  Location: [facility];  Service: Orthopedics;  Laterality: Left;   CARPAL TUNNEL RELEASE Right 02/15/2022   Procedure: CARPAL TUNNEL RELEASE;  Surgeon: Cleotilde Barrio, MD;  Location: [facility];  Service: Orthopedics;  Laterality: Right;   CORONARY ANGIOPLASTY WITH STENT PLACEMENT Left 05/03/2021   Procedure: CORONARY ANGIOPLASTY WITH STENT PLACEMENT; Location: [facility]   EYE SURGERY     cataract to both   KNEE ARTHROSCOPY Right    LEFT HEART CATH AND CORONARY ANGIOGRAPHY Left 01/15/2011   Procedure: LEFT HEART CATH AND CORONARY ANGIOGRAPHY; Location: [facility]; Surgeon: Denyse Bathe, MD   LEFT HEART CATH AND CORONARY ANGIOGRAPHY N/A 03/24/2021   Procedure: LEFT HEART CATH AND CORONARY ANGIOGRAPHY with intervention;  Surgeon: Bathe Denyse LABOR, MD;  Location: [facility];  Service: Cardiovascular;  Laterality: N/A;   left toe removed Left    2nd toe   ROTATOR CUFF REPAIR Right 2015   TONSILLECTOMY       Current Outpatient Medications  Medication Sig Dispense Refill   acetaminophen  (TYLENOL ) 500 MG tablet Take 1,000 mg by mouth every 8 (eight) hours as needed.     amLODipine (NORVASC) 10 MG tablet TAKE 1 TABLET BY MOUTH  DAILY 90 tablet 3   aspirin  EC 81 MG tablet Take 81 mg by mouth at bedtime.     B-D 3CC LUER-LOK SYR 21GX1 21G X 1 3 ML MISC USE TO INJECT 1 ML OF TESTOSTERONE  INTO MUSCLE  12   BD HYPODERMIC NEEDLE 18G X 1 MISC USE AS DIRECTED FOR WEEKLY INJECTIONS  1   BD ULTRA-FINE PEN NEEDLES 29G X 12.7MM MISC USE 2 (TWO) TIMES DAILY.  3   buprenorphine (SUBUTEX) 8 MG SUBL SL tablet Place 4-8 mg under the tongue 2 (two) times daily as needed (pain). Take 8 mg by mouth in the morning and 4 mg in the evening as needed for pain     cetirizine (ZYRTEC)  10 MG tablet Take 10 mg by mouth daily.     clopidogrel (PLAVIX) 75 MG tablet TAKE 1 TABLET BY MOUTH DAILY 90 tablet 3   Coenzyme Q10 (CO Q-10) 100 MG CAPS Take 100 mg by mouth in the morning and at bedtime.     Continuous Blood Gluc Receiver (FREESTYLE LIBRE READER) DEVI USE 1 EACH ONCE DAILY.  1   Continuous Blood Gluc Sensor (FREESTYLE LIBRE SENSOR SYSTEM) MISC USE 3 EACH EVERY 10 (TEN) DAYS.  11   diazepam (VALIUM) 5 MG tablet Take 2.5 mg by mouth daily as needed for anxiety.     ezetimibe  (ZETIA ) 10 MG tablet TAKE ONE TABLET EVERY DAY 90 tablet 0   insulin  aspart (NOVOLOG) 100 UNIT/ML FlexPen Inject 2-6 Units into the skin 3 (three) times daily as needed (blood sugar of 250 or above).     lisinopril -hydrochlorothiazide  (ZESTORETIC ) 20-12.5 MG tablet Take 1 tablet by mouth daily. 30 tablet 11   meloxicam  (MOBIC ) 15 MG tablet Take 1 tablet (15 mg total) by mouth daily. 30 tablet 3   meloxicam  (MOBIC ) 15 MG tablet Take 1 tablet (15 mg total) by mouth daily. 30 tablet 3   metoprolol  tartrate (LOPRESSOR ) 50 MG tablet Take 2 night before and 2 tab 90 miutes prior to ccta 6 tablet 0   Multiple Vitamins-Minerals (PRESERVISION AREDS 2 PO) Take 2 tablets by mouth in the morning and at bedtime.     NARCAN 4 MG/0.1ML LIQD nasal spray kit Place 0.4 mg into the nose once.     ondansetron  (ZOFRAN ) 8 MG tablet Take 8 mg by mouth every 8 (eight) hours as needed.      pantoprazole  (PROTONIX ) 40 MG tablet TAKE ONE TABLET BY MOUTH DAILY 90 tablet 1   testosterone  cypionate (DEPOTESTOSTERONE CYPIONATE) 200 MG/ML injection Inject 200 mg into the muscle every 14 (fourteen) days.      timolol (TIMOPTIC) 0.5 % ophthalmic solution Place 1 drop into the left eye 2 (two) times daily.   3   TOUJEO  SOLOSTAR 300 UNIT/ML Solostar Pen Inject 48 Units into the skin at bedtime.     venlafaxine  XR (EFFEXOR -XR) 37.5 MG 24 hr capsule Take 1 capsule (37.5 mg total) by mouth daily. 90 capsule 1   zolpidem (AMBIEN) 10 MG tablet Take 10 mg by mouth at bedtime as needed for sleep.      Bempedoic Acid-Ezetimibe  (NEXLIZET ) 180-10 MG TABS Take 1 tablet by mouth daily. 30 tablet 2   Evolocumab  (REPATHA  SURECLICK) 140 MG/ML SOAJ Inject 140 mg into the skin every 14 (fourteen) days. 2 mL 0   gabapentin  (NEURONTIN ) 400 MG capsule Take 1 capsule (400 mg total) by mouth 3 (three) times daily. (Patient not taking: Reported on 12/23/2023) 60 capsule 3   icosapent  Ethyl (VASCEPA ) 1 g capsule TAKE 2 CAPSULES BY MOUTH TWICE DAILY 120 capsule 3   No current facility-administered medications for this visit.   Facility-Administered Medications Ordered in Other Visits  Medication Dose Route Frequency Provider Last Rate Last Admin   sodium chloride  flush (NS) 0.9 % injection 3 mL  3 mL Intravenous Q12H Fernand Denyse LABOR, MD        Allergies:   Crestor [rosuvastatin], Lotensin [benazepril], Statins, and Zocor [simvastatin]    Social History:   reports that he has been smoking cigarettes. He has a 22.5 pack-year smoking history. He has never used smokeless tobacco. He reports current alcohol use. He reports current drug use. Drug: Marijuana.   Family History:  family  history includes Alcohol abuse in his father; Heart disease in his father and mother.    ROS:     Review of Systems  Constitutional: Negative.   HENT: Negative.    Eyes: Negative.   Respiratory: Negative.    Gastrointestinal:  Negative.   Genitourinary: Negative.   Musculoskeletal: Negative.   Skin: Negative.   Neurological: Negative.   Endo/Heme/Allergies: Negative.   Psychiatric/Behavioral: Negative.    All other systems reviewed and are negative.     All other systems are reviewed and negative.    PHYSICAL EXAM: VS:  BP 110/66   Pulse 85   Ht 5' 9 (1.753 m)   Wt 213 lb 3.2 oz (96.7 kg)   SpO2 96%   BMI 31.48 kg/m  , BMI Body mass index is 31.48 kg/m. Last weight:  Wt Readings from Last 3 Encounters:  12/23/23 213 lb 3.2 oz (96.7 kg)  07/30/22 244 lb (110.7 kg)  07/02/22 238 lb 3.2 oz (108 kg)     Physical Exam Vitals reviewed.  Constitutional:      Appearance: Normal appearance. He is normal weight.  HENT:     Head: Normocephalic.     Nose: Nose normal.     Mouth/Throat:     Mouth: Mucous membranes are moist.  Eyes:     Pupils: Pupils are equal, round, and reactive to light.  Cardiovascular:     Rate and Rhythm: Normal rate and regular rhythm.     Pulses: Normal pulses.     Heart sounds: Normal heart sounds.  Pulmonary:     Effort: Pulmonary effort is normal.  Abdominal:     General: Abdomen is flat. Bowel sounds are normal.  Musculoskeletal:        General: Normal range of motion.     Cervical back: Normal range of motion.  Skin:    General: Skin is warm.  Neurological:     General: No focal deficit present.     Mental Status: He is alert.  Psychiatric:        Mood and Affect: Mood normal.       EKG:   Recent Labs: No results found for requested labs within last 365 days.    Lipid Panel    Component Value Date/Time   CHOL 142 09/01/2019 1012   CHOL 172 02/03/2018 0923   TRIG 117 09/01/2019 1012   TRIG 297 (H) 02/03/2018 0923   HDL 36 (L) 09/01/2019 1012   CHOLHDL 5.3 (H) 05/21/2017 1146   VLDL 59 (H) 02/03/2018 0923   LDLCALC 85 09/01/2019 1012      Other studies Reviewed: Additional studies/ records that were reviewed today include:  Review of the  above records demonstrates:       No data to display            ASSESSMENT AND PLAN:    ICD-10-CM   1. Coronary artery disease involving native coronary artery of native heart without angina pectoris  I25.10 lisinopril -hydrochlorothiazide  (ZESTORETIC ) 20-12.5 MG tablet    Bempedoic Acid-Ezetimibe  (NEXLIZET ) 180-10 MG TABS    Evolocumab  (REPATHA  SURECLICK) 140 MG/ML SOAJ   No chest pains.    2. Essential hypertension  I10 lisinopril -hydrochlorothiazide  (ZESTORETIC ) 20-12.5 MG tablet    Bempedoic Acid-Ezetimibe  (NEXLIZET ) 180-10 MG TABS    Evolocumab  (REPATHA  SURECLICK) 140 MG/ML SOAJ   change lisinoproil/HCTZ.20/12.5 daily    3. Obstructive sleep apnea syndrome  G47.33 lisinopril -hydrochlorothiazide  (ZESTORETIC ) 20-12.5 MG tablet    Bempedoic Acid-Ezetimibe  (NEXLIZET ) 180-10 MG TABS  Evolocumab  (REPATHA  SURECLICK) 140 MG/ML SOAJ    4. Morbid obesity with alveolar hypoventilation (HCC)  E66.2 lisinopril -hydrochlorothiazide  (ZESTORETIC ) 20-12.5 MG tablet    Bempedoic Acid-Ezetimibe  (NEXLIZET ) 180-10 MG TABS    Evolocumab  (REPATHA  SURECLICK) 140 MG/ML SOAJ    5. Mixed hyperlipidemia  E78.2 lisinopril -hydrochlorothiazide  (ZESTORETIC ) 20-12.5 MG tablet    Bempedoic Acid-Ezetimibe  (NEXLIZET ) 180-10 MG TABS    Evolocumab  (REPATHA  SURECLICK) 140 MG/ML SOAJ       Problem List Items Addressed This Visit       Cardiovascular and Mediastinum   Essential hypertension   Relevant Medications   lisinopril -hydrochlorothiazide  (ZESTORETIC ) 20-12.5 MG tablet   Bempedoic Acid-Ezetimibe  (NEXLIZET ) 180-10 MG TABS   Evolocumab  (REPATHA  SURECLICK) 140 MG/ML SOAJ   CAD (coronary artery disease) - Primary   Relevant Medications   lisinopril -hydrochlorothiazide  (ZESTORETIC ) 20-12.5 MG tablet   Bempedoic Acid-Ezetimibe  (NEXLIZET ) 180-10 MG TABS   Evolocumab  (REPATHA  SURECLICK) 140 MG/ML SOAJ     Respiratory   Sleep apnea   Relevant Medications   lisinopril -hydrochlorothiazide   (ZESTORETIC ) 20-12.5 MG tablet   Bempedoic Acid-Ezetimibe  (NEXLIZET ) 180-10 MG TABS   Evolocumab  (REPATHA  SURECLICK) 140 MG/ML SOAJ     Other   Hyperlipidemia   Relevant Medications   lisinopril -hydrochlorothiazide  (ZESTORETIC ) 20-12.5 MG tablet   Bempedoic Acid-Ezetimibe  (NEXLIZET ) 180-10 MG TABS   Evolocumab  (REPATHA  SURECLICK) 140 MG/ML SOAJ   Morbid obesity with alveolar hypoventilation (HCC)   Relevant Medications   lisinopril -hydrochlorothiazide  (ZESTORETIC ) 20-12.5 MG tablet   Bempedoic Acid-Ezetimibe  (NEXLIZET ) 180-10 MG TABS   Evolocumab  (REPATHA  SURECLICK) 140 MG/ML SOAJ       Disposition:   Return in about 3 months (around 03/24/2024).    Total time spent: 30 minutes  Signed,  Denyse Bathe, MD  12/23/2023 2:39 PM    [facility]

## 2023-12-26 ENCOUNTER — Telehealth: Payer: Self-pay

## 2023-12-26 ENCOUNTER — Other Ambulatory Visit: Payer: Self-pay

## 2023-12-26 DIAGNOSIS — C44222 Squamous cell carcinoma of skin of right ear and external auricular canal: Principal | ICD-10-CM

## 2023-12-26 DIAGNOSIS — C44329 Squamous cell carcinoma of skin of other parts of face: Principal | ICD-10-CM

## 2023-12-26 DIAGNOSIS — C4492 Squamous cell carcinoma of skin, unspecified: Secondary | ICD-10-CM

## 2023-12-26 NOTE — Telephone Encounter (Signed)
 Front desk relayed a message saying:  [facility] called and said that they were never able to contact this patient about his referral and they do not take his insurance so they are not able to schedule him.

## 2023-12-26 NOTE — Telephone Encounter (Signed)
-----   Message from [place] sent at 12/26/2023  2:18 PM EDT ----- Regarding: mohs referral Hi Lorie Melichar,  Can we refer to [facility] for Mohs please? If not then refer to Dr Corey. Thank you,  Dr Claudene

## 2023-12-26 NOTE — Progress Notes (Signed)
 SCC of post crus of R antihelix referral sent to [facility] (Originally sent to [facility] and they could not contact patient, then sent to [facility] and they could not contact patient and his insurance was out of network.)

## 2023-12-26 NOTE — Telephone Encounter (Signed)
 Left pt message that [facility] contacted us  about the referral we placed to them for the [place] of the post crus of R antihelix.  Advised pt [facility] had tried contacting him to let him know they are out of network for his insurance.  Advised I had sent referral to [facility] with Dr. Gregorio.  Also advised pt to please call the office to discuss bx results from 11/21/23 visit./sh

## 2024-01-07 ENCOUNTER — Other Ambulatory Visit: Payer: Self-pay | Admitting: Cardiovascular Disease

## 2024-01-07 DIAGNOSIS — I10 Essential (primary) hypertension: Secondary | ICD-10-CM

## 2024-01-07 DIAGNOSIS — I2510 Atherosclerotic heart disease of native coronary artery without angina pectoris: Secondary | ICD-10-CM

## 2024-01-07 DIAGNOSIS — E782 Mixed hyperlipidemia: Secondary | ICD-10-CM

## 2024-01-07 DIAGNOSIS — E662 Morbid (severe) obesity with alveolar hypoventilation: Secondary | ICD-10-CM

## 2024-01-07 DIAGNOSIS — K2100 Gastro-esophageal reflux disease with esophagitis, without bleeding: Secondary | ICD-10-CM

## 2024-01-07 DIAGNOSIS — G4733 Obstructive sleep apnea (adult) (pediatric): Secondary | ICD-10-CM

## 2024-01-22 ENCOUNTER — Ambulatory Visit: Admitting: Dermatology

## 2024-01-22 ENCOUNTER — Encounter: Payer: Self-pay | Admitting: Dermatology

## 2024-01-22 DIAGNOSIS — L814 Other melanin hyperpigmentation: Secondary | ICD-10-CM

## 2024-01-22 DIAGNOSIS — L821 Other seborrheic keratosis: Secondary | ICD-10-CM

## 2024-01-22 DIAGNOSIS — D229 Melanocytic nevi, unspecified: Secondary | ICD-10-CM

## 2024-01-22 DIAGNOSIS — W908XXA Exposure to other nonionizing radiation, initial encounter: Secondary | ICD-10-CM | POA: Diagnosis not present

## 2024-01-22 DIAGNOSIS — D2362 Other benign neoplasm of skin of left upper limb, including shoulder: Secondary | ICD-10-CM

## 2024-01-22 DIAGNOSIS — L578 Other skin changes due to chronic exposure to nonionizing radiation: Secondary | ICD-10-CM | POA: Diagnosis not present

## 2024-01-22 DIAGNOSIS — C4492 Squamous cell carcinoma of skin, unspecified: Secondary | ICD-10-CM

## 2024-01-22 DIAGNOSIS — D1801 Hemangioma of skin and subcutaneous tissue: Secondary | ICD-10-CM

## 2024-01-22 DIAGNOSIS — D492 Neoplasm of unspecified behavior of bone, soft tissue, and skin: Secondary | ICD-10-CM | POA: Diagnosis not present

## 2024-01-22 DIAGNOSIS — D044 Carcinoma in situ of skin of scalp and neck: Secondary | ICD-10-CM | POA: Diagnosis not present

## 2024-01-22 DIAGNOSIS — Z872 Personal history of diseases of the skin and subcutaneous tissue: Secondary | ICD-10-CM

## 2024-01-22 DIAGNOSIS — Z7189 Other specified counseling: Secondary | ICD-10-CM

## 2024-01-22 DIAGNOSIS — L570 Actinic keratosis: Secondary | ICD-10-CM | POA: Diagnosis not present

## 2024-01-22 DIAGNOSIS — D692 Other nonthrombocytopenic purpura: Secondary | ICD-10-CM

## 2024-01-22 MED ORDER — MUPIROCIN 2 % EX OINT: 1.0000 | TOPICAL_OINTMENT | CUTANEOUS | 6 refills | Status: AC

## 2024-01-22 MED ORDER — FLUOROURACIL 5 % EX CREA: TOPICAL_CREAM | CUTANEOUS | 2 refills | Status: AC

## 2024-01-22 NOTE — Progress Notes (Signed)
 Follow-Up Visit   Subjective  Joshua VONDRAK Sr. is a 68 y.o. male who presents for the following: 2 month f/u bx sites, SCC bx proven R frontal temple scalp, SCC IS bx proven R sup sideburn, SCC bx proven L proximal forearm, SCC bx proven L temple scalp, Inflamed AK bx proven R lat cheek, SCC bx proven post crus R antihelix pt scheduled for mohs at [facility] 02/13/24   The following portions of the chart were reviewed this encounter and updated as appropriate: medications, allergies, medical history  Review of Systems:  No other skin or systemic complaints except as noted in HPI or Assessment and Plan.  Objective  Well appearing patient in no apparent distress; mood and affect are within normal limits.   A focused examination was performed of the following areas: Face, scalp, arms  Relevant exam findings are noted in the Assessment and Plan.  R frontal temple scalp photo for mohs   L temple scalp photo for mohs   L temple photo for mohs  Posterior central neck base Pink scaly pap 5.9mm  Posterior parietal scalp Pink scaly pap 8.3mm  R post temple Pink scaly pap 5.61mm  L radial mid forearm Pink scaly pap 4.31mm  Left Elbow 2.21mm keratotic pap  L inf helix x 1, L post auricular x 1, L sup sideburn x 1, L temple scalp x 1, L parietal scalp x 7, vertex scalp x 2, R ear x 6, R eyebrow x 1, Lateral to R canthus x 1 (21) Pink scaly macules  Assessment & Plan   HISTORY of BOILS Bil axilla Exam: not examined today  Treatment Plan: Cont Mupirocin  oint qd/bid prn flares   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 Gregorio, photos above marked with purple  ink  ACTINIC DAMAGE WITH PRECANCEROUS ACTINIC KERATOSES Counseling for Topical Chemotherapy Management: Patient exhibits: - Severe, confluent actinic changes with pre-cancerous actinic keratoses that is secondary to cumulative UV radiation exposure over time - Condition that is severe; chronic, not at goal. - diffuse scaly erythematous macules and papules with underlying dyspigmentation - Discussed Prescription Field Treatment topical Chemotherapy for Severe, Chronic Confluent Actinic Changes with Pre-Cancerous Actinic Keratoses Field treatment involves treatment of an entire area of skin that has confluent Actinic Changes (Sun/ Ultraviolet light damage) and PreCancerous Actinic Keratoses by method of PhotoDynamic Therapy (PDT) and/or prescription Topical Chemotherapy agents such as 5-fluorouracil , 5-fluorouracil /calcipotriene , and/or imiquimod.  The purpose is to decrease the number of clinically evident and subclinical PreCancerous lesions to prevent progression to development of skin cancer by chemically destroying early precancer changes that may or may not be visible.  It has been shown to reduce the risk of developing skin cancer in the treated area. As a result of treatment, redness, scaling, crusting, and open sores may occur during treatment course. One or more than one of these methods may be used and may have to be used several times to control, suppress and eliminate the PreCancerous changes. Discussed treatment course, expected reaction, and possible side effects. - Recommend daily broad spectrum sunscreen SPF 30+ to sun-exposed areas, reapply every 2 hours as needed.  - Staying in the shade or wearing long  sleeves, sun glasses (UVA+UVB protection) and wide brim hats (4-inch brim around the entire circumference of the hat) are also recommended. - Call for new or changing lesions.  - Start 5-fluorouracil /calcipotriene  cream twice a day for 4-7 days until area gets red and irritated to  affected areas including scalp. Prescription sent to [facility]. Patient advised they will receive an email to purchase the medication online and have it sent to their home. Patient provided with handout reviewing treatment course and side effects and advised to call or message us  on MyChart with any concerns.  Reviewed course of treatment and expected reaction.  Patient advised to expect inflammation and crusting and advised that erosions are possible.  Patient advised to be diligent with sun protection during and after treatment. Counseled to keep medication out of reach of children and pets.   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 Exam: pink bx site  Treatment Plan: Clear today from bx observe  SQUAMOUS CELL CARCINOMA Bx proven L proximal forearm Exam: pink bx site  Treatment Plan: Clear today from bx, observe  NEOPLASM OF SKIN (5) Posterior central neck base Skin / nail biopsy Type of biopsy: tangential   Informed consent: discussed and consent obtained   Timeout: patient name, date of birth, surgical site, and procedure verified   Procedure prep:  Patient was prepped and draped in usual sterile fashion Prep type:  Isopropyl alcohol Anesthesia: the lesion was anesthetized in a standard fashion   Anesthetic:  1% lidocaine  w/ epinephrine 1-100,000 buffered w/ 8.4% NaHCO3 Instrument used: DermaBlade   Hemostasis achieved with: pressure and aluminum chloride   Outcome: patient tolerated procedure well   Post-procedure details: sterile dressing applied and wound care instructions given   Dressing type: bandage and petrolatum    Specimen 1 - Surgical pathology Differential Diagnosis: R/O SCC vs BCC  Check Margins: No Pink scaly pap 5.69mm Posterior parietal scalp Skin / nail biopsy Type of biopsy: tangential   Informed consent: discussed and consent obtained   Timeout: patient name, date of birth, surgical site, and procedure verified    Procedure prep:  Patient was prepped and draped in usual sterile fashion Prep type:  Isopropyl alcohol Anesthesia: the lesion was anesthetized in a standard fashion   Anesthetic:  1% lidocaine  w/ epinephrine 1-100,000 buffered w/ 8.4% NaHCO3 Instrument used: DermaBlade   Hemostasis achieved with: pressure and aluminum chloride   Outcome: patient tolerated procedure well   Post-procedure details: sterile dressing applied and wound care instructions given   Dressing type: bandage and petrolatum    Specimen 2 - Surgical pathology Differential Diagnosis: R/O SCC vs BCC  Check Margins: No Pink scaly pap 8.37mm R post temple Skin / nail biopsy Type of biopsy: tangential   Informed consent: discussed and consent obtained   Timeout: patient name, date of birth, surgical site, and procedure verified   Procedure prep:  Patient was prepped and draped in usual sterile fashion Prep type:  Isopropyl alcohol Anesthesia: the lesion was anesthetized in a standard fashion   Anesthetic:  1% lidocaine  w/ epinephrine 1-100,000 buffered w/ 8.4% NaHCO3 Instrument used: DermaBlade   Hemostasis achieved with: pressure and aluminum chloride   Outcome: patient tolerated procedure well   Post-procedure details: sterile dressing applied and wound care instructions given   Dressing type: bandage and petrolatum    Specimen 3 - Surgical pathology Differential Diagnosis: R/O SCC vs BCC  Check Margins: No Pink scaly pap 5.60mm L radial mid forearm Skin /  nail biopsy Type of biopsy: tangential   Informed consent: discussed and consent obtained   Timeout: patient name, date of birth, surgical site, and procedure verified   Procedure prep:  Patient was prepped and draped in usual sterile fashion Prep type:  Isopropyl alcohol Anesthesia: the lesion was anesthetized in a standard fashion   Anesthetic:  1% lidocaine  w/ epinephrine 1-100,000 buffered w/ 8.4% NaHCO3 Instrument used: DermaBlade   Hemostasis  achieved with: pressure and aluminum chloride   Outcome: patient tolerated procedure well   Post-procedure details: sterile dressing applied and wound care instructions given   Dressing type: bandage and petrolatum    Specimen 4 - Surgical pathology Differential Diagnosis: R/O SCC vs BCC  Check Margins: No Pink scaly pap 4.56mm Left Elbow Skin / nail biopsy Type of biopsy: tangential   Informed consent: discussed and consent obtained   Timeout: patient name, date of birth, surgical site, and procedure verified   Procedure prep:  Patient was prepped and draped in usual sterile fashion Prep type:  Isopropyl alcohol Anesthesia: the lesion was anesthetized in a standard fashion   Anesthetic:  1% lidocaine  w/ epinephrine 1-100,000 buffered w/ 8.4% NaHCO3 Instrument used: DermaBlade   Hemostasis achieved with: pressure and aluminum chloride   Outcome: patient tolerated procedure well   Post-procedure details: sterile dressing applied and wound care instructions given   Dressing type: bandage and petrolatum    Specimen 5 - Surgical pathology Differential Diagnosis: R/O SCC vs BCC  Check Margins: No 2.35mm keratotic pap AK (ACTINIC KERATOSIS) (21) L inf helix x 1, L post auricular x 1, L sup sideburn x 1, L temple scalp x 1, L parietal scalp x 7, vertex scalp x 2, R ear x 6, R eyebrow x 1, Lateral to R canthus x 1 (21) Actinic keratoses are precancerous spots that appear secondary to cumulative UV radiation exposure/sun exposure over time. They are chronic with expected duration over 1 year. A portion of actinic keratoses will progress to squamous cell carcinoma of the skin. It is not possible to reliably predict which spots will progress to skin cancer and so treatment is recommended to prevent development of skin cancer.  Recommend daily broad spectrum sunscreen SPF 30+ to sun-exposed areas, reapply every 2 hours as needed.  Recommend staying in the shade or wearing long sleeves, sun  glasses (UVA+UVB protection) and wide brim hats (4-inch brim around the entire circumference of the hat). Call for new or changing lesions. Destruction of lesion - L inf helix x 1, L post auricular x 1, L sup sideburn x 1, L temple scalp x 1, L parietal scalp x 7, vertex scalp x 2, R ear x 6, R eyebrow x 1, Lateral to R canthus x 1 (21) Complexity: simple   Destruction method: cryotherapy   Informed consent: discussed and consent obtained   Timeout:  patient name, date of birth, surgical site, and procedure verified Lesion destroyed using liquid nitrogen: Yes   Region frozen until ice ball extended beyond lesion: Yes   Cryo cycles: 1 or 2. Outcome: patient tolerated procedure well with no complications   Post-procedure details: wound care instructions given    SCC (SQUAMOUS CELL CARCINOMA)   Related Procedures Ambulatory referral to Dermatology MULTIPLE BENIGN NEVI   LENTIGINES   ACTINIC ELASTOSIS   SEBORRHEIC KERATOSES   CHERRY ANGIOMA   SOLAR PURPURA (HCC)    Return in about 3 months (around 04/22/2024) for surgery appt to f/u bx and treat other areas.  I,  Grayce Saunas, RMA, am acting as scribe for Boneta Sharps, MD .   Documentation: I have reviewed the above documentation for accuracy and completeness, and I agree with the above.  Boneta Sharps, MD

## 2024-01-22 NOTE — Patient Instructions (Addendum)
 Start 5-fluorouracil /calcipotriene  cream twice a day for 4-7 days until area gets red and irritated to affected areas including scalp. Prescription sent to [facility]. Patient advised they will receive an email to purchase the medication online and have it sent to their home. Patient provided with handout reviewing treatment course and side effects and advised to call or message us  on MyChart with any concerns.   [facility] [address] [place], [place] [address]  Phone: [phone] TOLL-FREE: [phone]   Wound Care Instructions  Cleanse wound gently with soap and water once a day then pat dry with clean gauze. Apply a thin coat of Petrolatum (petroleum jelly, Vaseline) over the wound (unless you have an allergy to this). We recommend that you use a new, sterile tube of Vaseline. Do not pick or remove scabs. Do not remove the yellow or white healing tissue from the base of the wound.  Cover the wound with fresh, clean, nonstick gauze and secure with paper tape. You may use Band-Aids in place of gauze and tape if the wound is small enough, but would recommend trimming much of the tape off as there is often too much. Sometimes Band-Aids can irritate the skin.  You should call the office for your biopsy report after 1 week if you have not already been contacted.  If you experience any problems, such as abnormal amounts of bleeding, swelling, significant bruising, significant pain, or evidence of infection, please call the office immediately.  FOR ADULT SURGERY PATIENTS: If you need something for pain relief you may take 1 extra strength Tylenol  (acetaminophen ) AND 2 Ibuprofen  (200mg  each) together every 4 hours as needed for pain. (do not take these if you are allergic to them or if you have a reason you should not take them.) Typically, you may only need pain medication for 1 to 3 days.    Cryotherapy Aftercare  Wash gently with soap and water everyday.   Apply  Vaseline and Band-Aid daily until healed.   Due to recent changes in healthcare laws, you may see results of your pathology and/or laboratory studies on MyChart before the doctors have had a chance to review them. We understand that in some cases there may be results that are confusing or concerning to you. Please understand that not all results are received at the same time and often the doctors may need to interpret multiple results in order to provide you with the best plan of care or course of treatment. Therefore, we ask that you please give us  2 business days to thoroughly review all your results before contacting the office for clarification. Should we see a critical lab result, you will be contacted sooner.   If You Need Anything After Your Visit  If you have any questions or concerns for your doctor, please call our main line at [phone] and press option 4 to reach your doctor's medical assistant. If no one answers, please leave a voicemail as directed and we will return your call as soon as possible. Messages left after 4 pm will be answered the following business day.   You may also send us  a message via MyChart. We typically respond to MyChart messages within 1-2 business days.  For prescription refills, please ask your pharmacy to contact our office. Our fax number is [phone].  If you have an urgent issue when the clinic is closed that cannot wait until the next business day, you can page your doctor at the number below.    Please  note that while we do our best to be available for urgent issues outside of office hours, we are not available 24/7.   If you have an urgent issue and are unable to reach us , you may choose to seek medical care at your doctor's office, retail clinic, urgent care center, or emergency room.  If you have a medical emergency, please immediately call 911 or go to the emergency department.  Pager Numbers  - Dr. Hester: [phone]  - Dr. Jackquline:  [phone]  - Dr. Claudene: [phone]   - Dr. Raymund: [phone]  In the event of inclement weather, please call our main line at [phone] for an update on the status of any delays or closures.  Dermatology Medication Tips: Please keep the boxes that topical medications come in in order to help keep track of the instructions about where and how to use these. Pharmacies typically print the medication instructions only on the boxes and not directly on the medication tubes.   If your medication is too expensive, please contact our office at [phone] option 4 or send us  a message through MyChart.   We are unable to tell what your co-pay for medications will be in advance as this is different depending on your insurance coverage. However, we may be able to find a substitute medication at lower cost or fill out paperwork to get insurance to cover a needed medication.   If a prior authorization is required to get your medication covered by your insurance company, please allow us  1-2 business days to complete this process.  Drug prices often vary depending on where the prescription is filled and some pharmacies may offer cheaper prices.  The website www.goodrx.com contains coupons for medications through different pharmacies. The prices here do not account for what the cost may be with help from insurance (it may be cheaper with your insurance), but the website can give you the price if you did not use any insurance.  - You can print the associated coupon and take it with your prescription to the pharmacy.  - You may also stop by our office during regular business hours and pick up a GoodRx coupon card.  - If you need your prescription sent electronically to a different pharmacy, notify our office through [facility] or by phone at [phone] option 4.     Si Usted Necesita Algo Despus de Su Visita  Tambin puede enviarnos un mensaje a travs de Clinical cytogeneticist. Por lo general  respondemos a los mensajes de MyChart en el transcurso de 1 a 2 das hbiles.  Para renovar recetas, por favor pida a su farmacia que se ponga en contacto con nuestra oficina. Randi lakes de fax es [place] [phone].  Si tiene un asunto urgente cuando la clnica est cerrada y que no puede esperar hasta el siguiente da hbil, puede llamar/localizar a su doctor(a) al nmero que aparece a continuacin.   Por favor, tenga en cuenta que aunque hacemos todo lo posible para estar disponibles para asuntos urgentes fuera del horario de [place], no estamos disponibles las 24 horas del da, los 7 [address]  [address] de la [place].   Si tiene un problema urgente y no puede comunicarse con nosotros, puede optar por buscar atencin mdica  en el consultorio de su doctor(a), en una clnica privada, en un centro de atencin urgente o en una sala de emergencias.  Si tiene Engineer, drilling, por favor llame inmediatamente al 911 o vaya a la sala de emergencias.  Nmeros de  bper  - Dr. Hester: [phone]  - Dra. Jackquline: [phone]  - Dr. Claudene: [phone]  - Dra. Kitts: [phone]  En caso de inclemencias del [place], por favor llame a nuestra lnea principal al [phone] para una actualizacin sobre el estado de cualquier retraso o cierre.  Consejos para la medicacin en dermatologa: Por favor, guarde las cajas en las que vienen los medicamentos de uso tpico para ayudarle a seguir las instrucciones sobre dnde y cmo usarlos. Las farmacias generalmente imprimen las instrucciones del medicamento slo en las cajas y no directamente en los tubos del [place].   Si su medicamento es muy caro, por favor, pngase en contacto con landry rieger llamando al [phone] y presione la opcin 4 o envenos un mensaje a travs de Clinical cytogeneticist.   No podemos decirle cul ser su copago por los medicamentos por adelantado ya que esto es diferente dependiendo de la cobertura de su seguro. Sin embargo, es posible  que podamos encontrar un medicamento sustituto a Audiological scientist un formulario para que el seguro cubra el medicamento que se considera necesario.   Si se requiere una autorizacin previa para que su compaa de seguros malta su medicamento, por favor permtanos de 1 a 2 das hbiles para completar este proceso.  Los precios de los medicamentos varan con frecuencia dependiendo del Environmental consultant de dnde se surte la receta y alguna farmacias pueden ofrecer precios ms baratos.  El sitio web www.goodrx.com tiene cupones para medicamentos de Health and safety inspector. Los precios aqu no tienen en cuenta lo que podra costar con la ayuda del seguro (puede ser ms barato con su seguro), pero el sitio web puede darle el precio si no utiliz Tourist information centre manager.  - Puede imprimir el cupn correspondiente y llevarlo con su receta a la farmacia.  - Tambin puede pasar por nuestra oficina durante el horario de atencin regular y Education officer, museum una tarjeta de cupones de GoodRx.  - Si necesita que su receta se enve electrnicamente a una farmacia diferente, informe a nuestra oficina a travs de MyChart de [place] o por telfono llamando al [phone] y presione la opcin 4.

## 2024-01-24 LAB — SURGICAL PATHOLOGY

## 2024-01-26 ENCOUNTER — Ambulatory Visit: Payer: Self-pay | Admitting: Dermatology

## 2024-01-27 ENCOUNTER — Encounter: Payer: Self-pay | Admitting: Dermatology

## 2024-01-27 NOTE — Telephone Encounter (Signed)
-----   Message from [facility] sent at 01/26/2024 10:12 PM EDT ----- Diagnosis      1. Skin, posterior central neck base :       SQUAMOUS CELL CARCINOMA IN SITU        2. Skin, posterior parietal scalp :       ACTINIC KERATOSIS        3. Skin, R post temple :       HYPERTROPHIC ACTINIC KERATOSIS        4. 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 Skin, left elbow :       HYPERTROPHIC ACTINIC KERATOSIS    Please call   Posterior neck: SCCis. Patient can treat with 5FU/calcipotriene  BID until reaction occurs (already sent) or wait for follow up and have EDC Post parietal scalp: AK, recheck at follow up R Post temple: AK, recheck at follow up L forearm: benign overgrowth of top layer of skin with some separation of layers. No treatment needed L elbow: AK, recheck at follow up ----- Message ----- From: Interface, Lab In [facility] Sent: 01/24/2024   3:57 PM EDT To: Boneta Sharps, MD

## 2024-01-27 NOTE — Telephone Encounter (Signed)
 LVM for pt to return call to office for bx results. Lonell RAMAN., RMA

## 2024-02-06 ENCOUNTER — Other Ambulatory Visit: Payer: Self-pay | Admitting: Cardiovascular Disease

## 2024-02-06 DIAGNOSIS — J3489 Other specified disorders of nose and nasal sinuses: Principal | ICD-10-CM

## 2024-02-06 DIAGNOSIS — I2510 Atherosclerotic heart disease of native coronary artery without angina pectoris: Secondary | ICD-10-CM

## 2024-02-06 DIAGNOSIS — E662 Morbid (severe) obesity with alveolar hypoventilation: Secondary | ICD-10-CM

## 2024-02-06 DIAGNOSIS — E782 Mixed hyperlipidemia: Secondary | ICD-10-CM

## 2024-02-06 DIAGNOSIS — I10 Essential (primary) hypertension: Secondary | ICD-10-CM

## 2024-02-06 DIAGNOSIS — G4733 Obstructive sleep apnea (adult) (pediatric): Secondary | ICD-10-CM

## 2024-02-06 MED ORDER — CETIRIZINE 10 MG TABLET
ORAL_TABLET | Freq: Every day | ORAL | 3 refills | 90.00000 days | Status: CP
Start: 2024-02-06 — End: ?

## 2024-02-25 NOTE — Telephone Encounter (Signed)
-----   Message from [facility] sent at 01/26/2024 10:12 PM EDT ----- Diagnosis      1. Skin, posterior central neck base :       SQUAMOUS CELL CARCINOMA IN SITU        2. Skin, posterior parietal scalp :       ACTINIC KERATOSIS        3. Skin, R post temple :       HYPERTROPHIC ACTINIC KERATOSIS        4. 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 Skin, left elbow :       HYPERTROPHIC ACTINIC KERATOSIS    Please call   Posterior neck: SCCis. Patient can treat with 5FU/calcipotriene  BID until reaction occurs (already sent) or wait for follow up and have EDC Post parietal scalp: AK, recheck at follow up R Post temple: AK, recheck at follow up L forearm: benign overgrowth of top layer of skin with some separation of layers. No treatment needed L elbow: AK, recheck at follow up ----- Message ----- From: Interface, Lab In [facility] Sent: 01/24/2024   3:57 PM EDT To: Boneta Sharps, MD

## 2024-02-25 NOTE — Telephone Encounter (Signed)
 Left voicemail to return my call

## 2024-03-03 DIAGNOSIS — F1190 Opioid use, unspecified, uncomplicated: Principal | ICD-10-CM

## 2024-03-03 MED ORDER — BUPRENORPHINE HCL 8 MG SUBLINGUAL TABLET
ORAL_TABLET | 0.00000 days
Start: 2024-03-03 — End: ?

## 2024-03-04 MED ORDER — BUPRENORPHINE HCL 8 MG SUBLINGUAL TABLET
ORAL_TABLET | SUBLINGUAL | 0 refills | 0.00000 days | Status: CP
Start: 2024-03-04 — End: ?

## 2024-03-06 ENCOUNTER — Other Ambulatory Visit: Payer: Self-pay | Admitting: Cardiovascular Disease

## 2024-03-06 DIAGNOSIS — I10 Essential (primary) hypertension: Secondary | ICD-10-CM

## 2024-03-06 DIAGNOSIS — I2510 Atherosclerotic heart disease of native coronary artery without angina pectoris: Secondary | ICD-10-CM

## 2024-03-06 DIAGNOSIS — E782 Mixed hyperlipidemia: Secondary | ICD-10-CM

## 2024-03-06 DIAGNOSIS — G4733 Obstructive sleep apnea (adult) (pediatric): Secondary | ICD-10-CM

## 2024-03-06 DIAGNOSIS — E662 Morbid (severe) obesity with alveolar hypoventilation: Secondary | ICD-10-CM

## 2024-03-18 MED ORDER — NALOXONE 4 MG/ACTUATION NASAL SPRAY
0 refills | 0.00000 days
Start: 2024-03-18 — End: ?

## 2024-03-18 MED ORDER — BUPRENORPHINE HCL 8 MG SUBLINGUAL TABLET
ORAL_TABLET | 0 refills | 0.00000 days
Start: 2024-03-18 — End: ?

## 2024-03-19 ENCOUNTER — Encounter: Admit: 2024-03-19 | Discharge: 2024-03-19 | Payer: Medicare (Managed Care)

## 2024-03-19 DIAGNOSIS — G8929 Other chronic pain: Principal | ICD-10-CM

## 2024-03-19 DIAGNOSIS — M25512 Pain in left shoulder: Principal | ICD-10-CM

## 2024-03-19 DIAGNOSIS — Z79899 Other long term (current) drug therapy: Principal | ICD-10-CM

## 2024-03-19 DIAGNOSIS — L089 Local infection of the skin and subcutaneous tissue, unspecified: Principal | ICD-10-CM

## 2024-03-19 DIAGNOSIS — Z23 Encounter for immunization: Principal | ICD-10-CM

## 2024-03-19 DIAGNOSIS — F1190 Opioid use, unspecified, uncomplicated: Principal | ICD-10-CM

## 2024-03-19 DIAGNOSIS — R058 Productive cough: Principal | ICD-10-CM

## 2024-03-19 DIAGNOSIS — H8110 Benign paroxysmal vertigo, unspecified ear: Principal | ICD-10-CM

## 2024-03-19 MED ORDER — NALOXONE 4 MG/ACTUATION NASAL SPRAY
NASAL | 0 refills | 0.00000 days | Status: CP
Start: 2024-03-19 — End: ?

## 2024-03-19 MED ORDER — BUPRENORPHINE HCL 8 MG SUBLINGUAL TABLET
ORAL_TABLET | Freq: Two times a day (BID) | SUBLINGUAL | 2 refills | 30.00000 days | Status: CP
Start: 2024-03-19 — End: 2024-06-17

## 2024-03-19 MED ORDER — MUPIROCIN 2 % TOPICAL OINTMENT
Freq: Two times a day (BID) | TOPICAL | 1 refills | 0.00000 days | Status: CP
Start: 2024-03-19 — End: ?

## 2024-03-19 MED ORDER — DIAZEPAM 5 MG TABLET
ORAL_TABLET | Freq: Every day | ORAL | 0 refills | 20.00000 days | Status: CP | PRN
Start: 2024-03-19 — End: 2025-03-19

## 2024-03-24 ENCOUNTER — Ambulatory Visit: Admitting: Cardiovascular Disease

## 2024-03-27 DIAGNOSIS — F1190 Opioid use, unspecified, uncomplicated: Principal | ICD-10-CM

## 2024-04-09 ENCOUNTER — Other Ambulatory Visit: Payer: Self-pay | Admitting: Cardiovascular Disease

## 2024-04-09 DIAGNOSIS — I2510 Atherosclerotic heart disease of native coronary artery without angina pectoris: Secondary | ICD-10-CM

## 2024-04-09 DIAGNOSIS — I10 Essential (primary) hypertension: Secondary | ICD-10-CM

## 2024-04-09 DIAGNOSIS — E782 Mixed hyperlipidemia: Secondary | ICD-10-CM

## 2024-04-09 DIAGNOSIS — E662 Morbid (severe) obesity with alveolar hypoventilation: Secondary | ICD-10-CM

## 2024-04-09 DIAGNOSIS — G4733 Obstructive sleep apnea (adult) (pediatric): Secondary | ICD-10-CM

## 2024-04-15 ENCOUNTER — Encounter: Admitting: Dermatology

## 2024-05-08 ENCOUNTER — Other Ambulatory Visit: Payer: Self-pay | Admitting: Cardiovascular Disease

## 2024-05-08 DIAGNOSIS — I2510 Atherosclerotic heart disease of native coronary artery without angina pectoris: Secondary | ICD-10-CM

## 2024-05-08 DIAGNOSIS — E662 Morbid (severe) obesity with alveolar hypoventilation: Secondary | ICD-10-CM

## 2024-05-08 DIAGNOSIS — G4733 Obstructive sleep apnea (adult) (pediatric): Secondary | ICD-10-CM

## 2024-05-08 DIAGNOSIS — E782 Mixed hyperlipidemia: Secondary | ICD-10-CM

## 2024-05-08 DIAGNOSIS — I10 Essential (primary) hypertension: Secondary | ICD-10-CM

## 2024-06-03 ENCOUNTER — Encounter: Admitting: Dermatology

## 2024-06-09 ENCOUNTER — Other Ambulatory Visit: Payer: Self-pay | Admitting: Cardiovascular Disease

## 2024-06-09 DIAGNOSIS — F411 Generalized anxiety disorder: Principal | ICD-10-CM

## 2024-06-09 DIAGNOSIS — E782 Mixed hyperlipidemia: Secondary | ICD-10-CM

## 2024-06-09 DIAGNOSIS — E662 Morbid (severe) obesity with alveolar hypoventilation: Secondary | ICD-10-CM

## 2024-06-09 DIAGNOSIS — I2510 Atherosclerotic heart disease of native coronary artery without angina pectoris: Secondary | ICD-10-CM

## 2024-06-09 DIAGNOSIS — G4733 Obstructive sleep apnea (adult) (pediatric): Secondary | ICD-10-CM

## 2024-06-09 DIAGNOSIS — I10 Essential (primary) hypertension: Secondary | ICD-10-CM

## 2024-06-09 MED ORDER — VENLAFAXINE ER 37.5 MG CAPSULE,EXTENDED RELEASE 24 HR
ORAL_CAPSULE | Freq: Every day | ORAL | 3 refills | 90.00000 days | Status: CP
Start: 2024-06-09 — End: ?

## 2024-07-15 ENCOUNTER — Encounter: Admitting: Dermatology
